# Patient Record
Sex: Female | Born: 1935 | Race: Black or African American | Hispanic: No | Smoking: Never smoker
Health system: Southern US, Community
[De-identification: ages and names within clinical notes are randomized; demographics above are authoritative.]

## PROBLEM LIST (undated history)

## (undated) DIAGNOSIS — M109 Gout, unspecified: Secondary | ICD-10-CM

## (undated) DIAGNOSIS — E039 Hypothyroidism, unspecified: Secondary | ICD-10-CM

## (undated) DIAGNOSIS — I10 Essential (primary) hypertension: Secondary | ICD-10-CM

## (undated) DIAGNOSIS — K5900 Constipation, unspecified: Secondary | ICD-10-CM

## (undated) DIAGNOSIS — J189 Pneumonia, unspecified organism: Secondary | ICD-10-CM

## (undated) DIAGNOSIS — I639 Cerebral infarction, unspecified: Secondary | ICD-10-CM

## (undated) DIAGNOSIS — I4891 Unspecified atrial fibrillation: Secondary | ICD-10-CM

## (undated) DIAGNOSIS — K219 Gastro-esophageal reflux disease without esophagitis: Secondary | ICD-10-CM

## (undated) DIAGNOSIS — R12 Heartburn: Secondary | ICD-10-CM

## (undated) DIAGNOSIS — E785 Hyperlipidemia, unspecified: Secondary | ICD-10-CM

## (undated) HISTORY — DX: Essential (primary) hypertension: I10

## (undated) HISTORY — DX: Hyperlipidemia, unspecified: E78.5

## (undated) HISTORY — PX: EYE SURGERY: SHX253

## (undated) HISTORY — PX: TUBAL LIGATION: SHX77

## (undated) HISTORY — PX: ABDOMINAL HYSTERECTOMY: SHX81

## (undated) HISTORY — PX: DILATION AND CURETTAGE OF UTERUS: SHX78

## (undated) HISTORY — DX: Gout, unspecified: M10.9

## (undated) HISTORY — DX: Cerebral infarction, unspecified: I63.9

## (undated) HISTORY — DX: Heartburn: R12

## (undated) HISTORY — DX: Unspecified atrial fibrillation: I48.91

## (undated) HISTORY — DX: Hypothyroidism, unspecified: E03.9

## (undated) HISTORY — DX: Constipation, unspecified: K59.00

## (undated) HISTORY — PX: OTHER SURGICAL HISTORY: SHX169

---

## 2000-07-16 ENCOUNTER — Other Ambulatory Visit: Admission: RE | Admit: 2000-07-16 | Discharge: 2000-07-16 | Payer: Self-pay | Admitting: Obstetrics & Gynecology

## 2002-12-05 ENCOUNTER — Other Ambulatory Visit: Admission: RE | Admit: 2002-12-05 | Discharge: 2002-12-05 | Payer: Self-pay | Admitting: Obstetrics & Gynecology

## 2004-02-19 ENCOUNTER — Ambulatory Visit: Payer: Self-pay | Admitting: Internal Medicine

## 2004-03-11 ENCOUNTER — Ambulatory Visit: Payer: Self-pay | Admitting: Internal Medicine

## 2004-08-01 ENCOUNTER — Ambulatory Visit: Payer: Self-pay | Admitting: Internal Medicine

## 2005-01-17 ENCOUNTER — Emergency Department (HOSPITAL_COMMUNITY): Admission: EM | Admit: 2005-01-17 | Discharge: 2005-01-17 | Payer: Self-pay | Admitting: Emergency Medicine

## 2005-02-03 ENCOUNTER — Ambulatory Visit: Payer: Self-pay | Admitting: Internal Medicine

## 2005-05-05 ENCOUNTER — Ambulatory Visit: Payer: Self-pay | Admitting: Internal Medicine

## 2005-07-31 ENCOUNTER — Ambulatory Visit: Payer: Self-pay | Admitting: Internal Medicine

## 2005-10-23 ENCOUNTER — Ambulatory Visit: Payer: Self-pay | Admitting: Internal Medicine

## 2005-10-30 ENCOUNTER — Ambulatory Visit: Payer: Self-pay | Admitting: Internal Medicine

## 2006-01-04 ENCOUNTER — Ambulatory Visit: Payer: Self-pay | Admitting: Internal Medicine

## 2006-05-17 ENCOUNTER — Ambulatory Visit: Payer: Self-pay | Admitting: Internal Medicine

## 2006-05-17 LAB — CONVERTED CEMR LAB
BUN: 13 mg/dL (ref 6–23)
Basophils Absolute: 0.1 10*3/uL (ref 0.0–0.1)
Basophils Relative: 1 % (ref 0.0–1.0)
Cholesterol: 206 mg/dL (ref 0–200)
Creatinine, Ser: 0.7 mg/dL (ref 0.4–1.2)
Direct LDL: 137.8 mg/dL
Eosinophils Absolute: 0.1 10*3/uL (ref 0.0–0.6)
Eosinophils Relative: 1.4 % (ref 0.0–5.0)
HCT: 39.2 % (ref 36.0–46.0)
HDL: 49.9 mg/dL (ref >39.0)
Hemoglobin: 13.2 g/dL (ref 12.0–15.0)
Lymphocytes Relative: 32.5 % (ref 12.0–46.0)
MCHC: 33.7 g/dL (ref 30.0–36.0)
MCV: 86 fL (ref 78.0–100.0)
Monocytes Absolute: 0.4 10*3/uL (ref 0.2–0.7)
Monocytes Relative: 5.8 % (ref 3.0–11.0)
Neutro Abs: 4.3 10*3/uL (ref 1.4–7.7)
Neutrophils Relative %: 59.3 % (ref 43.0–77.0)
Platelets: 257 10*3/uL (ref 150–400)
Potassium: 4.4 meq/L (ref 3.5–5.1)
RBC: 4.56 M/uL (ref 3.87–5.11)
RDW: 15 % — ABNORMAL HIGH (ref 11.5–14.6)
Total CHOL/HDL Ratio: 4.1
Triglycerides: 95 mg/dL (ref 0–149)
Uric Acid, Serum: 4.6 mg/dL (ref 2.4–7.0)
VLDL: 19 mg/dL (ref 0–40)
WBC: 7.3 10*3/uL (ref 4.5–10.5)

## 2006-06-02 ENCOUNTER — Ambulatory Visit: Payer: Self-pay | Admitting: Gastroenterology

## 2006-06-08 ENCOUNTER — Ambulatory Visit: Payer: Self-pay | Admitting: Gastroenterology

## 2006-06-08 LAB — HM COLONOSCOPY

## 2006-10-13 DIAGNOSIS — I10 Essential (primary) hypertension: Secondary | ICD-10-CM | POA: Insufficient documentation

## 2006-10-13 DIAGNOSIS — M1A00X0 Idiopathic chronic gout, unspecified site, without tophus (tophi): Secondary | ICD-10-CM

## 2006-11-23 ENCOUNTER — Ambulatory Visit: Payer: Self-pay | Admitting: Internal Medicine

## 2006-11-23 DIAGNOSIS — E119 Type 2 diabetes mellitus without complications: Secondary | ICD-10-CM

## 2006-11-23 LAB — CONVERTED CEMR LAB
BUN: 10 mg/dL (ref 6–23)
CO2: 32 meq/L (ref 19–32)
Calcium: 10.1 mg/dL (ref 8.4–10.5)
Chloride: 105 meq/L (ref 96–112)
Creatinine, Ser: 0.7 mg/dL (ref 0.4–1.2)
GFR calc Af Amer: 106 mL/min
GFR calc non Af Amer: 87 mL/min
Glucose, Bld: 105 mg/dL — ABNORMAL HIGH (ref 70–99)
Hgb A1c MFr Bld: 6.3 % — ABNORMAL HIGH (ref 4.6–6.0)
Potassium: 4.7 meq/L (ref 3.5–5.1)
Sodium: 145 meq/L (ref 135–145)
Uric Acid, Serum: 4.4 mg/dL (ref 2.4–7.0)

## 2007-05-24 ENCOUNTER — Ambulatory Visit: Payer: Self-pay | Admitting: Internal Medicine

## 2007-05-24 LAB — CONVERTED CEMR LAB
BUN: 18 mg/dL (ref 6–23)
CO2: 29 meq/L (ref 19–32)
Calcium: 10 mg/dL (ref 8.4–10.5)
Chloride: 100 meq/L (ref 96–112)
Creatinine, Ser: 0.8 mg/dL (ref 0.4–1.2)
GFR calc Af Amer: 90 mL/min
GFR calc non Af Amer: 75 mL/min
Glucose, Bld: 92 mg/dL (ref 70–99)
Hgb A1c MFr Bld: 6.5 % — ABNORMAL HIGH (ref 4.6–6.0)
Potassium: 4 meq/L (ref 3.5–5.1)
Sodium: 139 meq/L (ref 135–145)

## 2007-09-22 ENCOUNTER — Ambulatory Visit: Payer: Self-pay | Admitting: Internal Medicine

## 2007-12-16 ENCOUNTER — Ambulatory Visit: Payer: Self-pay | Admitting: Internal Medicine

## 2007-12-16 LAB — CONVERTED CEMR LAB
ALT: 18 units/L (ref 0–35)
AST: 26 units/L (ref 0–37)
Albumin: 3.9 g/dL (ref 3.5–5.2)
Alkaline Phosphatase: 83 units/L (ref 39–117)
BUN: 15 mg/dL (ref 6–23)
Bilirubin, Direct: 0.2 mg/dL (ref 0.0–0.3)
CO2: 29 meq/L (ref 19–32)
Calcium: 9.5 mg/dL (ref 8.4–10.5)
Chloride: 110 meq/L (ref 96–112)
Cholesterol: 206 mg/dL (ref 0–200)
Creatinine, Ser: 0.7 mg/dL (ref 0.4–1.2)
Creatinine,U: 178.6 mg/dL
Direct LDL: 149.3 mg/dL
GFR calc Af Amer: 105 mL/min
GFR calc non Af Amer: 87 mL/min
Glucose, Bld: 113 mg/dL — ABNORMAL HIGH (ref 70–99)
HDL: 40.6 mg/dL (ref >39.0)
Hgb A1c MFr Bld: 6.6 % — ABNORMAL HIGH (ref 4.6–6.0)
Microalb Creat Ratio: 45.4 mg/g — ABNORMAL HIGH (ref 0.0–30.0)
Microalb, Ur: 8.1 mg/dL — ABNORMAL HIGH (ref 0.0–1.9)
Potassium: 4 meq/L (ref 3.5–5.1)
Sodium: 143 meq/L (ref 135–145)
Total Bilirubin: 0.7 mg/dL (ref 0.3–1.2)
Total CHOL/HDL Ratio: 5.1
Total Protein: 7.3 g/dL (ref 6.0–8.3)
Triglycerides: 126 mg/dL (ref 0–149)
VLDL: 25 mg/dL (ref 0–40)

## 2007-12-23 ENCOUNTER — Ambulatory Visit: Payer: Self-pay | Admitting: Internal Medicine

## 2008-01-24 ENCOUNTER — Telehealth: Payer: Self-pay | Admitting: Internal Medicine

## 2008-02-13 ENCOUNTER — Encounter: Payer: Self-pay | Admitting: Internal Medicine

## 2008-02-15 ENCOUNTER — Telehealth: Payer: Self-pay | Admitting: Internal Medicine

## 2008-04-16 ENCOUNTER — Ambulatory Visit: Payer: Self-pay | Admitting: Internal Medicine

## 2008-04-16 DIAGNOSIS — K130 Diseases of lips: Secondary | ICD-10-CM

## 2008-04-16 LAB — CONVERTED CEMR LAB
BUN: 16 mg/dL (ref 6–23)
CO2: 34 meq/L — ABNORMAL HIGH (ref 19–32)
Calcium: 9.8 mg/dL (ref 8.4–10.5)
Chloride: 102 meq/L (ref 96–112)
Creatinine, Ser: 0.7 mg/dL (ref 0.4–1.2)
Folate: >20 ng/mL
GFR calc Af Amer: 105 mL/min
GFR calc non Af Amer: 87 mL/min
Glucose, Bld: 90 mg/dL (ref 70–99)
Hgb A1c MFr Bld: 6.8 % — ABNORMAL HIGH (ref 4.6–6.0)
Potassium: 3.7 meq/L (ref 3.5–5.1)
Sodium: 143 meq/L (ref 135–145)
Uric Acid, Serum: 4.9 mg/dL (ref 2.4–7.0)
Vitamin B-12: 1121 pg/mL — ABNORMAL HIGH (ref 211–911)

## 2008-09-10 ENCOUNTER — Ambulatory Visit: Payer: Self-pay | Admitting: Internal Medicine

## 2008-09-10 ENCOUNTER — Telehealth: Payer: Self-pay | Admitting: *Deleted

## 2008-09-10 DIAGNOSIS — J0100 Acute maxillary sinusitis, unspecified: Secondary | ICD-10-CM

## 2008-09-17 ENCOUNTER — Encounter: Payer: Self-pay | Admitting: *Deleted

## 2008-12-17 ENCOUNTER — Ambulatory Visit: Payer: Self-pay | Admitting: Internal Medicine

## 2008-12-17 LAB — CONVERTED CEMR LAB
ALT: 18 units/L (ref 0–35)
AST: 21 units/L (ref 0–37)
Albumin: 4.1 g/dL (ref 3.5–5.2)
Alkaline Phosphatase: 86 units/L (ref 39–117)
BUN: 11 mg/dL (ref 6–23)
Bilirubin, Direct: 0 mg/dL (ref 0.0–0.3)
CO2: 32 meq/L (ref 19–32)
Calcium: 9.6 mg/dL (ref 8.4–10.5)
Chloride: 107 meq/L (ref 96–112)
Creatinine, Ser: 0.9 mg/dL (ref 0.4–1.2)
GFR calc non Af Amer: 78.52 mL/min (ref >60)
Glucose, Bld: 83 mg/dL (ref 70–99)
Hgb A1c MFr Bld: 6.6 % — ABNORMAL HIGH (ref 4.6–6.5)
Potassium: 3.8 meq/L (ref 3.5–5.1)
Sodium: 143 meq/L (ref 135–145)
Total Bilirubin: 0.1 mg/dL — ABNORMAL LOW (ref 0.3–1.2)
Total Protein: 7.5 g/dL (ref 6.0–8.3)

## 2009-03-25 ENCOUNTER — Telehealth: Payer: Self-pay | Admitting: Internal Medicine

## 2009-06-05 ENCOUNTER — Ambulatory Visit: Payer: Self-pay | Admitting: Internal Medicine

## 2009-06-07 ENCOUNTER — Ambulatory Visit: Payer: Self-pay | Admitting: Internal Medicine

## 2009-06-07 LAB — CONVERTED CEMR LAB
BUN: 12 mg/dL (ref 6–23)
CO2: 33 meq/L — ABNORMAL HIGH (ref 19–32)
Calcium: 9.6 mg/dL (ref 8.4–10.5)
Chloride: 104 meq/L (ref 96–112)
Creatinine, Ser: 0.7 mg/dL (ref 0.4–1.2)
GFR calc non Af Amer: 104.81 mL/min (ref >60)
Glucose, Bld: 91 mg/dL (ref 70–99)
Hgb A1c MFr Bld: 6.2 % (ref 4.6–6.5)
Potassium: 3.7 meq/L (ref 3.5–5.1)
Sodium: 142 meq/L (ref 135–145)
Uric Acid, Serum: 4.1 mg/dL (ref 2.4–7.0)

## 2009-06-14 ENCOUNTER — Telehealth: Payer: Self-pay | Admitting: Internal Medicine

## 2009-06-14 DIAGNOSIS — F4321 Adjustment disorder with depressed mood: Secondary | ICD-10-CM

## 2009-09-16 ENCOUNTER — Encounter: Payer: Self-pay | Admitting: Internal Medicine

## 2009-10-22 LAB — HM DIABETES EYE EXAM

## 2009-11-15 ENCOUNTER — Ambulatory Visit: Payer: Self-pay | Admitting: Internal Medicine

## 2009-12-11 ENCOUNTER — Ambulatory Visit: Payer: Self-pay | Admitting: Internal Medicine

## 2009-12-11 LAB — CONVERTED CEMR LAB
ALT: 12 units/L (ref 0–35)
AST: 20 units/L (ref 0–37)
Albumin: 4.2 g/dL (ref 3.5–5.2)
Alkaline Phosphatase: 78 units/L (ref 39–117)
BUN: 16 mg/dL (ref 6–23)
Basophils Absolute: 0 10*3/uL (ref 0.0–0.1)
Basophils Relative: 0.4 % (ref 0.0–3.0)
Bilirubin, Direct: 0.1 mg/dL (ref 0.0–0.3)
CO2: 32 meq/L (ref 19–32)
Calcium: 9.7 mg/dL (ref 8.4–10.5)
Chloride: 104 meq/L (ref 96–112)
Cholesterol: 212 mg/dL — ABNORMAL HIGH (ref 0–200)
Creatinine, Ser: 0.8 mg/dL (ref 0.4–1.2)
Direct LDL: 136.9 mg/dL
Eosinophils Absolute: 0.1 10*3/uL (ref 0.0–0.7)
Eosinophils Relative: 1.5 % (ref 0.0–5.0)
GFR calc non Af Amer: 96.66 mL/min (ref >60)
Glucose, Bld: 108 mg/dL — ABNORMAL HIGH (ref 70–99)
HCT: 37.2 % (ref 36.0–46.0)
HDL: 53.6 mg/dL (ref >39.00)
Hemoglobin: 12.3 g/dL (ref 12.0–15.0)
Hgb A1c MFr Bld: 6.6 % — ABNORMAL HIGH (ref 4.6–6.5)
Lymphocytes Relative: 30.2 % (ref 12.0–46.0)
Lymphs Abs: 2.2 10*3/uL (ref 0.7–4.0)
MCHC: 33.1 g/dL (ref 30.0–36.0)
MCV: 87.7 fL (ref 78.0–100.0)
Monocytes Absolute: 0.5 10*3/uL (ref 0.1–1.0)
Monocytes Relative: 6.5 % (ref 3.0–12.0)
Neutro Abs: 4.4 10*3/uL (ref 1.4–7.7)
Neutrophils Relative %: 61.4 % (ref 43.0–77.0)
Platelets: 208 10*3/uL (ref 150.0–400.0)
Potassium: 4.3 meq/L (ref 3.5–5.1)
RBC: 4.24 M/uL (ref 3.87–5.11)
RDW: 16.3 % — ABNORMAL HIGH (ref 11.5–14.6)
Sodium: 144 meq/L (ref 135–145)
Total Bilirubin: 0.3 mg/dL (ref 0.3–1.2)
Total CHOL/HDL Ratio: 4
Total Protein: 7 g/dL (ref 6.0–8.3)
Triglycerides: 104 mg/dL (ref 0.0–149.0)
VLDL: 20.8 mg/dL (ref 0.0–40.0)
WBC: 7.2 10*3/uL (ref 4.5–10.5)

## 2009-12-18 ENCOUNTER — Ambulatory Visit: Payer: Self-pay | Admitting: Internal Medicine

## 2010-01-07 ENCOUNTER — Encounter: Payer: Self-pay | Admitting: Internal Medicine

## 2010-01-21 ENCOUNTER — Telehealth: Payer: Self-pay | Admitting: Internal Medicine

## 2010-04-22 NOTE — Assessment & Plan Note (Signed)
Summary: 1 month rov/njr   Vital Signs:  Patient profile:   77 year old female Height:      66 inches Weight:      166 pounds BMI:     26.89 Temp:     98.2 degrees F oral Pulse rate:   72 / minute Resp:     14 per minute BP sitting:   126 / 80  (left arm)  Vitals Entered By: Willy Eddy, LPN (December 18, 2009 12:22 PM) CC: roa labs, Type 2 diabetes mellitus follow-up Is Patient Diabetic? Yes Did you bring your meter with you today? No   Primary Care Durwin Davisson:  Stacie Glaze MD  CC:  roa labs and Type 2 diabetes mellitus follow-up.  History of Present Illness: follow up of DM and HTN recently lost husband  has been compliant with diet and medications blood glucoses are in normal ranges  Type 2 Diabetes Mellitus Follow-Up      This is a 77 year old woman who presents for Type 2 diabetes mellitus follow-up.  The patient denies polyuria, polydipsia, blurred vision, self managed hypoglycemia, hypoglycemia requiring help, weight loss, weight gain, and numbness of extremities.  The patient denies the following symptoms: neuropathic pain, chest pain, vomiting, orthostatic symptoms, poor wound healing, intermittent claudication, vision loss, and foot ulcer.  Since the last visit the patient reports good dietary compliance.  The patient has been measuring capillary blood glucose before breakfast.    Preventive Screening-Counseling & Management  Alcohol-Tobacco     Smoking Status: never     Tobacco Counseling: not indicated; no tobacco use  Problems Prior to Update: 1)  Grief Reaction, Acute  (ICD-309.0) 2)  Acute Maxillary Sinusitis  (ICD-461.0) 3)  Cheilosis  (ICD-528.5) 4)  Diabetes Mellitus, Type II  (ICD-250.00) 5)  Hypertension  (ICD-401.9) 6)  Gout  (ICD-274.9)  Current Problems (verified): 1)  Grief Reaction, Acute  (ICD-309.0) 2)  Acute Maxillary Sinusitis  (ICD-461.0) 3)  Cheilosis  (ICD-528.5) 4)  Diabetes Mellitus, Type II  (ICD-250.00) 5)  Hypertension   (ICD-401.9) 6)  Gout  (ICD-274.9)  Medications Prior to Update: 1)  Allopurinol 300 Mg  Tabs (Allopurinol) .... At Bedtime 2)  Lasix 20 Mg  Tabs (Furosemide) .... Once Daily 3)  Niacin Flush Free 500 Mg  Caps (Inositol Niacinate) .Marland Kitchen.. 1 Gm Every Day 4)  Tarka 4-240 Mg  Tbcr (Trandolapril-Verapamil Hcl) .... Two Times A Day 5)  Metformin Hcl 500 Mg  Tabs (Metformin Hcl) .... Once Daily 6)  Prednisone 5 Mg  Tabs (Prednisone) .... As Needed 7)  Elocon 0.1 %  Crea (Mometasone Furoate) .... As Needed 8)  Altabax 1 % Oint (Retapamulin) .... Apply To Edge of Lip Daily  Current Medications (verified): 1)  Allopurinol 300 Mg  Tabs (Allopurinol) .... At Bedtime 2)  Lasix 20 Mg  Tabs (Furosemide) .... Once Daily 3)  Tarka 4-240 Mg  Tbcr (Trandolapril-Verapamil Hcl) .... Two Times A Day 4)  Metformin Hcl 500 Mg  Tabs (Metformin Hcl) .... Once Daily 5)  Prednisone 5 Mg  Tabs (Prednisone) .... As Needed 6)  Elocon 0.1 %  Crea (Mometasone Furoate) .... As Needed  Allergies (verified): 1)  ! * Ivp Dye  Past History:  Family History: Last updated: 11/23/2006 father  unknown Family History Hypertension  Social History: Last updated: 11/23/2006 Never Smoked Married  Risk Factors: Exercise: yes (11/23/2006)  Risk Factors: Smoking Status: never (12/18/2009)  Past medical, surgical, family and social histories (including risk  factors) reviewed, and no changes noted (except as noted below).  Past Medical History: Reviewed history from 11/23/2006 and no changes required. Gout Hypertension AODM Lipids Diabetes mellitus, type II  Past Surgical History: Reviewed history from 10/13/2006 and no changes required. Hysterectomy D&C Tubal ligation Colonoscopy-06/08/2006  Family History: Reviewed history from 11/23/2006 and no changes required. father  unknown Family History Hypertension  Social History: Reviewed history from 11/23/2006 and no changes required. Never  Smoked Married  Review of Systems  The patient denies anorexia, fever, weight loss, weight gain, vision loss, decreased hearing, hoarseness, chest pain, syncope, dyspnea on exertion, peripheral edema, prolonged cough, headaches, hemoptysis, abdominal pain, melena, hematochezia, severe indigestion/heartburn, hematuria, incontinence, genital sores, muscle weakness, suspicious skin lesions, transient blindness, difficulty walking, depression, unusual weight change, abnormal bleeding, enlarged lymph nodes, angioedema, and breast masses.         Flu Vaccine Consent Questions     Do you have a history of severe allergic reactions to this vaccine? no    Any prior history of allergic reactions to egg and/or gelatin? no    Do you have a sensitivity to the preservative Thimersol? no    Do you have a past history of Guillan-Barre Syndrome? no    Do you currently have an acute febrile illness? no    Have you ever had a severe reaction to latex? no    Vaccine information given and explained to patient? yes    Are you currently pregnant? no    Lot Number:[id number]   Exp Date:09/20/2010   Site Given  Left Deltoid IM   Physical Exam  General:  Well-developed,well-nourished,in no acute distress; alert,appropriate and cooperative throughout examination Head:  no abnormalities observed.   Eyes:  pupils equal and pupils round.  loss of vision in left eye Ears:  R ear normal and L ear normal.   Nose:  nasal dischargemucosal pallor, mucosal erythema, and mucosal edema.   Mouth:  posterior lymphoid hypertrophy and postnasal drip.   Neck:  supple.   Lungs:  normal respiratory effort, no dullness, and no wheezes.   Heart:  normal rate and regular rhythm.     Impression & Recommendations:  Problem # 1:  HYPERTENSION (ICD-401.9)  Her updated medication list for this problem includes:    Lasix 20 Mg Tabs (Furosemide) ..... Once daily    Tarka 4-240 Mg Tbcr (Trandolapril-verapamil hcl) .Marland Kitchen..Marland Kitchen Two times a  day  BP today: 126/80 Prior BP: 140/80 (11/15/2009)  Prior 10 Yr Risk Heart Disease: Not enough information (12/17/2008)  Labs Reviewed: K+: 4.3 (12/11/2009) Creat: : 0.8 (12/11/2009)   Chol: 212 (12/11/2009)   HDL: 53.60 (12/11/2009)   LDL: DEL (12/16/2007)   TG: 104.0 (12/11/2009)  Problem # 2:  DIABETES MELLITUS, TYPE II (ICD-250.00)  Her updated medication list for this problem includes:    Tarka 4-240 Mg Tbcr (Trandolapril-verapamil hcl) .Marland Kitchen..Marland Kitchen Two times a day    Metformin Hcl 500 Mg Tabs (Metformin hcl) ..... Once daily  Labs Reviewed: Creat: 0.8 (12/11/2009)     Last Eye Exam: normal (10/22/2009) Reviewed HgBA1c results: 6.6 (12/11/2009)  6.2 (06/07/2009)  Problem # 3:  GRIEF REACTION, ACUTE (ICD-309.0) loss of husband improved moon dealing with loss  Complete Medication List: 1)  Allopurinol 300 Mg Tabs (Allopurinol) .... At bedtime 2)  Lasix 20 Mg Tabs (Furosemide) .... Once daily 3)  Tarka 4-240 Mg Tbcr (Trandolapril-verapamil hcl) .... Two times a day 4)  Metformin Hcl 500 Mg Tabs (Metformin hcl) .... Once daily 5)  Prednisone 5 Mg Tabs (Prednisone) .... As needed 6)  Elocon 0.1 % Crea (Mometasone furoate) .... As needed  Other Orders: Flu Vaccine 32yrs + MEDICARE PATIENTS (O3500) Administration Flu vaccine - MCR (X3818)  Patient Instructions: 1)  Please schedule a follow-up appointment in 6 months. 2)  HbgA1C prior to visit, ICD-9:

## 2010-04-22 NOTE — Assessment & Plan Note (Signed)
Summary: HTN CONCERNS // RS   Vital Signs:  Patient profile:   77 year old female Height:      66 inches Weight:      166 pounds BMI:     26.89 Temp:     98.2 degrees F oral Pulse rate:   76 / minute Resp:     14 per minute BP sitting:   140 / 80  (left arm)  Vitals Entered By: Willy Eddy, LPN (November 15, 2009 11:21 AM) CC: roa- c/o elevated bp- under alot of stress taking care of 80 year old mom and loss of husband Is Patient Diabetic? Yes Did you bring your meter with you today? No   Primary Care Jasmina Gendron:  Stacie Glaze MD  CC:  roa- c/o elevated bp- under alot of stress taking care of 59 year old mom and loss of husband.  History of Present Illness: The pt was asked about all immunizations, health maint. services that are appropriate to their age and was given guidance on diet exercize  and weight management   Follow-Up Visit      This is a 77 year old woman who presents for Follow-up visit.  The patient denies chest pain, palpitations, dizziness, syncope, low blood sugar symptoms, high blood sugar symptoms, edema, SOB, DOE, PND, and orthopnea.  Since the last visit the patient notes no new problems or concerns.  The patient reports taking meds as prescribed.  When questioned about possible medication side effects, the patient notes none.    Preventive Screening-Counseling & Management  Alcohol-Tobacco     Smoking Status: never  Problems Prior to Update: 1)  Grief Reaction, Acute  (ICD-309.0) 2)  Acute Maxillary Sinusitis  (ICD-461.0) 3)  Cheilosis  (ICD-528.5) 4)  Diabetes Mellitus, Type II  (ICD-250.00) 5)  Hypertension  (ICD-401.9) 6)  Gout  (ICD-274.9)  Current Problems (verified): 1)  Grief Reaction, Acute  (ICD-309.0) 2)  Acute Maxillary Sinusitis  (ICD-461.0) 3)  Cheilosis  (ICD-528.5) 4)  Diabetes Mellitus, Type II  (ICD-250.00) 5)  Hypertension  (ICD-401.9) 6)  Gout  (ICD-274.9)  Medications Prior to Update: 1)  Allopurinol 300 Mg  Tabs  (Allopurinol) .... At Bedtime 2)  Lasix 20 Mg  Tabs (Furosemide) .... Once Daily 3)  Niacin Flush Free 500 Mg  Caps (Inositol Niacinate) .Marland Kitchen.. 1 Gm Every Day 4)  Tarka 4-240 Mg  Tbcr (Trandolapril-Verapamil Hcl) .... Two Times A Day 5)  Metformin Hcl 500 Mg  Tabs (Metformin Hcl) .... Once Daily 6)  Prednisone 5 Mg  Tabs (Prednisone) .... As Needed 7)  Elocon 0.1 %  Crea (Mometasone Furoate) .... As Needed 8)  Altabax 1 % Oint (Retapamulin) .... Apply To Edge of Lip Daily  Current Medications (verified): 1)  Allopurinol 300 Mg  Tabs (Allopurinol) .... At Bedtime 2)  Lasix 20 Mg  Tabs (Furosemide) .... Once Daily 3)  Niacin Flush Free 500 Mg  Caps (Inositol Niacinate) .Marland Kitchen.. 1 Gm Every Day 4)  Tarka 4-240 Mg  Tbcr (Trandolapril-Verapamil Hcl) .... Two Times A Day 5)  Metformin Hcl 500 Mg  Tabs (Metformin Hcl) .... Once Daily 6)  Prednisone 5 Mg  Tabs (Prednisone) .... As Needed 7)  Elocon 0.1 %  Crea (Mometasone Furoate) .... As Needed 8)  Altabax 1 % Oint (Retapamulin) .... Apply To Edge of Lip Daily  Allergies (verified): 1)  ! * Ivp Dye  Past History:  Family History: Last updated: 11/23/2006 father  unknown Family History Hypertension  Social  History: Last updated: 11/23/2006 Never Smoked Married  Risk Factors: Exercise: yes (11/23/2006)  Risk Factors: Smoking Status: never (11/15/2009)  Past medical, surgical, family and social histories (including risk factors) reviewed, and no changes noted (except as noted below).  Past Medical History: Reviewed history from 11/23/2006 and no changes required. Gout Hypertension AODM Lipids Diabetes mellitus, type II  Past Surgical History: Reviewed history from 10/13/2006 and no changes required. Hysterectomy D&C Tubal ligation Colonoscopy-06/08/2006  Family History: Reviewed history from 11/23/2006 and no changes required. father  unknown Family History Hypertension  Social History: Reviewed history from 11/23/2006  and no changes required. Never Smoked Married  Review of Systems  The patient denies anorexia, fever, weight loss, weight gain, vision loss, decreased hearing, hoarseness, chest pain, syncope, dyspnea on exertion, peripheral edema, prolonged cough, headaches, hemoptysis, abdominal pain, melena, hematochezia, severe indigestion/heartburn, hematuria, incontinence, genital sores, muscle weakness, suspicious skin lesions, transient blindness, difficulty walking, depression, unusual weight change, abnormal bleeding, enlarged lymph nodes, angioedema, and breast masses.    Physical Exam  General:  Well-developed,well-nourished,in no acute distress; alert,appropriate and cooperative throughout examination Head:  no abnormalities observed.   Eyes:  pupils equal and pupils round.  loss of vision in left eye Ears:  R ear normal and L ear normal.   Nose:  nasal dischargemucosal pallor, mucosal erythema, and mucosal edema.   Mouth:  posterior lymphoid hypertrophy and postnasal drip.   Neck:  supple.   Lungs:  normal respiratory effort, no dullness, and no wheezes.   Heart:  normal rate and regular rhythm.   Abdomen:  soft and non-tender.   Msk:  no joint swelling and no joint warmth.   Pulses:  R and L carotid,radial,femoral,dorsalis pedis and posterior tibial pulses are full and equal bilaterally Extremities:  No clubbing, cyanosis, edema, or deformity noted with normal full range of motion of all joints.   Neurologic:  cranial nerves II-XII intact and finger-to-nose normal.    Diabetes Management Exam:    Eye Exam:       Eye Exam done elsewhere          Date: 10/22/2009          Results: normal          Done by: hecker   Impression & Recommendations:  Problem # 1:  GRIEF REACTION, ACUTE (ICD-309.0)  Problem # 2:  DIABETES MELLITUS, TYPE II (ICD-250.00) the DM stable Her updated medication list for this problem includes:    Tarka 4-240 Mg Tbcr (Trandolapril-verapamil hcl) .Marland Kitchen..Marland Kitchen Two times  a day    Metformin Hcl 500 Mg Tabs (Metformin hcl) ..... Once daily  Labs Reviewed: Creat: 0.7 (06/07/2009)     Last Eye Exam: normal (10/22/2009) Reviewed HgBA1c results: 6.2 (06/07/2009)  6.6 (12/17/2008)  Problem # 3:  HYPERTENSION (ICD-401.9) HTN stable with the lasix she had stopped the lasix and has now resumed it  Her updated medication list for this problem includes:    Lasix 20 Mg Tabs (Furosemide) ..... Once daily    Tarka 4-240 Mg Tbcr (Trandolapril-verapamil hcl) .Marland Kitchen..Marland Kitchen Two times a day  BP today: 140/80 Prior BP: 130/70 (06/07/2009)  Prior 10 Yr Risk Heart Disease: Not enough information (12/17/2008)  Labs Reviewed: K+: 3.7 (06/07/2009) Creat: : 0.7 (06/07/2009)   Chol: 206 (12/16/2007)   HDL: 40.6 (12/16/2007)   LDL: DEL (12/16/2007)   TG: 126 (12/16/2007)  Complete Medication List: 1)  Allopurinol 300 Mg Tabs (Allopurinol) .... At bedtime 2)  Lasix 20 Mg Tabs (Furosemide) .Marland KitchenMarland KitchenMarland Kitchen  Once daily 3)  Niacin Flush Free 500 Mg Caps (Inositol niacinate) .Marland Kitchen.. 1 gm every day 4)  Tarka 4-240 Mg Tbcr (Trandolapril-verapamil hcl) .... Two times a day 5)  Metformin Hcl 500 Mg Tabs (Metformin hcl) .... Once daily 6)  Prednisone 5 Mg Tabs (Prednisone) .... As needed 7)  Elocon 0.1 % Crea (Mometasone furoate) .... As needed 8)  Altabax 1 % Oint (Retapamulin) .... Apply to edge of lip daily  Patient Instructions: 1)  must stay on the furosemide!  follw up at the end of september 2)  Lipid Panel prior to visit, ICD-9:272.4 3)  BMP prior to visit, ICD-9:401.9 4)  CBC w/ Diff prior to visit, ICD-9:995.20 5)  Hepatic Panel prior to visit, ICD-9:995.20

## 2010-04-22 NOTE — Progress Notes (Signed)
Summary: conjunctivitis, Rx request  Phone Note Call from Patient   Caller: Patient Call For: Stacie Glaze MD Summary of Call: Pt states she has conjunctivitis in her left eye and needs RX called to [facility] ([place] [phone] Initial call taken by: Lynann Beaver CMA,  March 25, 2009 11:48 AM  Follow-up for Phone Call        tobradex 2 drops effected eye qid for 7-10 days Follow-up by: Stacie Glaze MD,  March 25, 2009 12:09 PM  Additional Follow-up for Phone Call Additional follow up Details #1::        Rx called in as pt daughter is here seeing Dr Caryl Never for same problem.  Daughter will pick up for mother at pharmacy Additional Follow-up by: Sid Falcon LPN,  March 25, 2009 12:28 PM

## 2010-04-22 NOTE — Progress Notes (Signed)
Summary: refill  Phone Note Refill Request Call back at [phone] Message from:  daughter-tracy   triage vm  Refills Requested: Medication #1:  TARKA 4-240 MG  TBCR two times a day [facility]  Initial call taken by: Warnell Forester,  January 21, 2010 10:11 AM    Prescriptions: Jolene Provost 4-240 MG  TBCR (TRANDOLAPRIL-VERAPAMIL HCL) two times a day  #180 x 3   Entered by:   Willy Eddy, LPN   Authorized by:   Stacie Glaze MD   Signed by:   Willy Eddy, LPN on 88/41/6606   Method used:   Electronically to        [facility] (mail-order)       [facility]       [address]       [place], [place]  [address]       Ph: [phone]       Fax: [phone]   RxID:   [id number]

## 2010-04-22 NOTE — Progress Notes (Signed)
Summary: REQ FOR RX (CONJUCTIVITIS?)  Phone Note Call from Patient   Caller: Patient  [phone] Reason for Call: Talk to Nurse, Talk to Doctor Summary of Call: Pt called in to adv that she is exp conjuctivitis (eye is red, woke up w/ crusting around eye, w/ itching and soreness).... Pt would like to have a Rx sent into [facility] [place].  Pt can be reached at [phone] with any questions or concerns.  Initial call taken by: Debbra Riding,  June 14, 2009 8:35 AM  Follow-up for Phone Call        per dr Lovell Sheehan- may have corticosporin opthalmic drops  4 drops four times a day in affected eye- med called and pt informed Follow-up by: Willy Eddy, LPN,  June 14, 2009 10:57 AM

## 2010-04-22 NOTE — Letter (Signed)
Summary: [facility]  [facility]   Imported By: Maryln Gottron 01/13/2010 13:22:02  _____________________________________________________________________  External Attachment:    Type:   Image     Comment:   External Document

## 2010-04-22 NOTE — Assessment & Plan Note (Signed)
Summary: roa/12noon/njr   Vital Signs:  Patient profile:   77 year old female Height:      66 inches Weight:      168 pounds BMI:     27.21 Temp:     98.2 degrees F oral Pulse rate:   76 / minute Resp:     14 per minute BP sitting:   130 / 70  (left arm)  Vitals Entered By: Willy Eddy, LPN (June 07, 2009 12:04 PM) CC: roa- med review, Hypertension Management   CC:  roa- med review and Hypertension Management.  History of Present Illness: dicusion of loss of her husband and its effect on her life and mood I have spent greater that 30 min face to face evaluating this patient of which over 1/2 was in counsilling ( grief)  Hypertension History:      She denies headache, chest pain, palpitations, dyspnea with exertion, orthopnea, PND, peripheral edema, visual symptoms, neurologic problems, syncope, and side effects from treatment.        Positive major cardiovascular risk factors include female age 81 years old or older, diabetes, and hypertension.  Negative major cardiovascular risk factors include non-tobacco-user status.     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 At Bedtime 2)  Lasix 20 Mg  Tabs (Furosemide) .... Once Daily 3)  Niacin Flush Free 500 Mg  Caps (Inositol Niacinate) .Marland Kitchen.. 1 Gm Every Day 4)  Tarka 4-240 Mg  Tbcr (Trandolapril-Verapamil Hcl) .... Two Times A Day 5)  Metformin Hcl 500 Mg  Tabs (Metformin Hcl) .... Once Daily 6)  Prednisone  5 Mg  Tabs (Prednisone) .... As Needed 7)  Elocon 0.1 %  Crea (Mometasone Furoate) .... As Needed 8)  Altabax 1 % Oint (Retapamulin) .... Apply To Edge of Lip Daily  Current Medications (verified): 1)  Allopurinol 300 Mg  Tabs (Allopurinol) .... At Bedtime 2)  Lasix 20 Mg  Tabs (Furosemide) .... Once Daily 3)  Niacin Flush Free 500 Mg  Caps (Inositol Niacinate) .Marland Kitchen.. 1 Gm Every Day 4)  Tarka 4-240 Mg  Tbcr (Trandolapril-Verapamil Hcl) .... Two Times A Day 5)  Metformin Hcl 500 Mg  Tabs (Metformin Hcl) .... Once Daily 6)  Prednisone 5 Mg  Tabs (Prednisone) .... As Needed 7)  Elocon 0.1 %  Crea (Mometasone Furoate) .... As Needed 8)  Altabax 1 % Oint (Retapamulin) .... Apply To Edge of Lip Daily  Allergies (verified): 1)  ! * Ivp Dye  Past History:  Family History: Last updated: 11/23/2006 father  unknown Family History Hypertension  Social History: Last updated: 11/23/2006 Never Smoked Married  Risk Factors: Exercise: yes (11/23/2006)  Risk Factors: Smoking Status: never (06/07/2009)  Past medical, surgical, family and social histories (including risk factors) reviewed, and no changes noted (except as noted below).  Past Medical History: Reviewed history from 11/23/2006 and no changes required. Gout Hypertension AODM Lipids Diabetes mellitus, type II  Past Surgical History: Reviewed history from 10/13/2006 and no  changes required. Hysterectomy D&C Tubal ligation Colonoscopy-06/08/2006  Family History: Reviewed history from 11/23/2006 and no changes required. father  unknown Family History Hypertension  Social History: Reviewed history from 11/23/2006 and no changes required. Never Smoked Married  Review of Systems       The patient complains of depression.  The patient denies anorexia, fever, weight loss, weight gain, vision loss, decreased hearing, hoarseness, chest pain, syncope, dyspnea on exertion, peripheral edema, prolonged cough, headaches,  hemoptysis, abdominal pain, hematochezia, severe indigestion/heartburn, hematuria, incontinence, genital sores, muscle weakness, suspicious skin lesions, transient blindness, difficulty walking, unusual weight change, abnormal bleeding, enlarged lymph nodes, angioedema, and breast masses.    Physical Exam  General:  Well-developed,well-nourished,in no acute distress; alert,appropriate and cooperative throughout examination Eyes:  pupils equal and pupils round.  loss of vision in left eye Nose:  nasal dischargemucosal pallor, mucosal erythema, and mucosal edema.   Neck:  supple.   Lungs:  normal respiratory effort, no dullness, and no wheezes.   Heart:  normal rate and regular rhythm.   Abdomen:  soft and non-tender.   Msk:  no joint swelling and no joint warmth.   Pulses:  R and L carotid,radial,femoral,dorsalis pedis and posterior tibial pulses are full and equal bilaterally Extremities:  No clubbing, cyanosis, edema, or deformity noted with normal full range of motion of all joints.   Neurologic:  cranial nerves II-XII intact and finger-to-nose normal.   Psych:  Oriented X3, subdued, and slightly anxious.     Impression & Recommendations:  Problem # 1:  HYPERTENSION (ICD-401.9)  Her updated medication list for this problem includes:    Lasix 20 Mg Tabs (Furosemide) ..... Once daily    Tarka 4-240 Mg Tbcr (Trandolapril-verapamil hcl) .Marland Kitchen..Marland Kitchen Two times a day  Orders: TLB-BMP (Basic Metabolic Panel-BMET) (80048-METABOL)  BP today: 130/70 Prior BP: 138/80 (06/05/2009)  Prior 10 Yr Risk Heart Disease: Not enough information (12/17/2008)  Labs Reviewed: K+: 3.8 (12/17/2008) Creat: : 0.9 (12/17/2008)   Chol: 206 (12/16/2007)   HDL: 40.6 (12/16/2007)   LDL: DEL (12/16/2007)   TG: 126 (12/16/2007)  Problem # 2:  DIABETES MELLITUS, TYPE II (ICD-250.00)  Her updated medication list for this problem includes:    Tarka 4-240 Mg Tbcr (Trandolapril-verapamil hcl) .Marland Kitchen..Marland Kitchen Two times a day     Metformin Hcl 500 Mg Tabs (Metformin hcl) ..... Once daily  Orders: Venipuncture (16109) TLB-BMP (Basic Metabolic Panel-BMET) (80048-METABOL) TLB-A1C / Hgb A1C (Glycohemoglobin) (83036-A1C)  Labs Reviewed: Creat: 0.9 (12/17/2008)     Last Eye Exam: No diabetic retinopathy.    (09/12/2008) Reviewed HgBA1c results: 6.6 (12/17/2008)  6.8 (04/16/2008)  Problem # 3:  GRIEF REACTION, ACUTE (ICD-309.0) councilling  Complete Medication List: 1)  Allopurinol 300 Mg Tabs (Allopurinol) .... At bedtime 2)  Lasix 20 Mg Tabs (Furosemide) .... Once daily 3)  Niacin Flush Free 500 Mg Caps (Inositol niacinate) .Marland Kitchen.. 1 gm every day 4)  Tarka 4-240 Mg Tbcr (Trandolapril-verapamil hcl) .... Two times a day 5)  Metformin Hcl 500 Mg Tabs (Metformin hcl) .... Once daily 6)  Prednisone 5 Mg Tabs (Prednisone) .... As needed 7)  Elocon 0.1 % Crea (Mometasone furoate) .... As needed 8)  Altabax 1 % Oint (Retapamulin) .... Apply to edge of lip daily  Other Orders: TLB-Uric Acid, Blood (84550-URIC)  Hypertension Assessment/Plan:      The patient's hypertensive risk group is category C: Target organ damage and/or diabetes.  Today's blood pressure is 130/70.  Her blood pressure goal is < 130/80.  Patient  Instructions: 1)  Please schedule a follow-up appointment in 4 months.

## 2010-04-22 NOTE — Letter (Signed)
Summary: Eye Exam/[facility]  Eye Exam/[facility]   Imported By: Maryln Gottron 09/24/2009 12:55:43  _____________________________________________________________________  External Attachment:    Type:   Image     Comment:   External Document

## 2010-04-22 NOTE — Assessment & Plan Note (Signed)
Summary: MED CHECK/REFILL/CJR   Vital Signs:  Patient profile:   77 year old female Height:      66 inches Weight:      166 pounds BMI:     26.89 Temp:     98.2 degrees F oral Pulse rate:   76 / minute Resp:     14 per minute BP sitting:   138 / 80  (left arm)  Vitals Entered By: Willy Eddy, LPN (June 05, 2009 10:19 AM) CC: pt arrived- no charge   CC:  pt arrived- no charge.  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 At Bedtime 2)  Lasix 20 Mg  Tabs (Furosemide) .... Once Daily 3)  Niacin Flush Free 500 Mg  Caps (Inositol Niacinate) .Marland Kitchen.. 1 Gm Every Day 4)  Tarka 4-240 Mg  Tbcr (Trandolapril-Verapamil Hcl) .... Two Times A Day 5)  Metformin Hcl 500 Mg  Tabs (Metformin Hcl) .... Once Daily 6)  Prednisone 5 Mg  Tabs (Prednisone) .... As Needed 7)  Elocon 0.1 %  Crea (Mometasone Furoate) .... As Needed 8)  Altabax 1 % Oint (Retapamulin) .... Apply To Edge of Lip Daily  Allergies (verified): 1)  ! * Ivp Dye   Complete Medication List: 1)  Allopurinol 300 Mg Tabs (Allopurinol) .... At bedtime 2)  Lasix 20 Mg Tabs (Furosemide) .... Once daily 3)  Niacin Flush Free 500 Mg Caps (Inositol niacinate) .Marland Kitchen.. 1 gm every day 4)  Tarka 4-240 Mg Tbcr (Trandolapril-verapamil hcl) .... Two times a day 5)  Metformin Hcl 500 Mg Tabs (Metformin hcl) .... Once daily 6)  Prednisone 5 Mg Tabs (Prednisone) .... As needed 7)  Elocon 0.1 % Crea (Mometasone furoate) .... As needed 8)  Altabax 1 % Oint (Retapamulin) .... Apply to edge of lip daily  Other Orders: No Charge Patient Arrived (NCPA0) (NCPA0) Prescriptions: ELOCON 0.1 %  CREA (MOMETASONE FUROATE) as  needed  #45 gm tube x 3   Entered by:   Willy Eddy, LPN   Authorized by:   Stacie Glaze MD   Signed by:   Willy Eddy, LPN on 16/12/9602   Method used:   Print then Give to Patient   RxID:   [id number] METFORMIN HCL 500 MG  TABS (METFORMIN HCL) once daily  #90 x 3   Entered by:   Willy Eddy, LPN   Authorized by:   Stacie Glaze MD   Signed by:   Willy Eddy, LPN on 21/30/8657   Method used:   Print then Give to Patient   RxID:   [id number] TARKA 4-240 MG  TBCR (TRANDOLAPRIL-VERAPAMIL HCL) two times a day  #180 x 3   Entered by:   Willy Eddy, LPN   Authorized by:   Stacie Glaze MD   Signed by:   Willy Eddy, LPN on 03/25/7251   Method used:   Print then Give to Patient   RxID:   [id number] LASIX 20 MG  TABS (FUROSEMIDE) once daily  #90 x 3   Entered by:   Willy Eddy, LPN   Authorized by:   Stacie Glaze MD   Signed by:  Willy Eddy, LPN on 16/12/9602   Method used:   Print then Give to Patient   RxID:   [id number] ALLOPURINOL 300 MG  TABS (ALLOPURINOL) at bedtime  #90 x 3   Entered by:   Willy Eddy, LPN   Authorized by:   Stacie Glaze MD   Signed by:   Willy Eddy, LPN on 21/30/8657   Method used:   Print then Give to Patient   RxID:   [id number]

## 2010-06-04 ENCOUNTER — Other Ambulatory Visit (INDEPENDENT_AMBULATORY_CARE_PROVIDER_SITE_OTHER): Payer: Medicare Other | Admitting: Internal Medicine

## 2010-06-04 DIAGNOSIS — E119 Type 2 diabetes mellitus without complications: Secondary | ICD-10-CM

## 2010-06-10 ENCOUNTER — Ambulatory Visit: Payer: Self-pay | Admitting: Internal Medicine

## 2010-06-17 ENCOUNTER — Encounter: Payer: Self-pay | Admitting: Internal Medicine

## 2010-06-18 ENCOUNTER — Ambulatory Visit (INDEPENDENT_AMBULATORY_CARE_PROVIDER_SITE_OTHER): Payer: Medicare Other | Admitting: Internal Medicine

## 2010-06-18 ENCOUNTER — Encounter: Payer: Self-pay | Admitting: Internal Medicine

## 2010-06-18 DIAGNOSIS — E119 Type 2 diabetes mellitus without complications: Secondary | ICD-10-CM

## 2010-06-18 DIAGNOSIS — M109 Gout, unspecified: Secondary | ICD-10-CM

## 2010-06-18 DIAGNOSIS — I10 Essential (primary) hypertension: Secondary | ICD-10-CM

## 2010-06-18 DIAGNOSIS — E785 Hyperlipidemia, unspecified: Secondary | ICD-10-CM

## 2010-06-18 MED ORDER — ALLOPURINOL 300 MG PO TABS: 300.0000 mg | ORAL_TABLET | Freq: Every day | ORAL | Status: DC

## 2010-06-18 MED ORDER — TRANDOLAPRIL-VERAPAMIL HCL ER 4-240 MG PO TBCR: 1.0000 | EXTENDED_RELEASE_TABLET | Freq: Two times a day (BID) | ORAL | Status: DC

## 2010-06-18 MED ORDER — METFORMIN HCL 500 MG PO TABS: 500.0000 mg | ORAL_TABLET | Freq: Every day | ORAL | Status: DC

## 2010-06-18 MED ORDER — FUROSEMIDE 20 MG PO TABS: 20.0000 mg | ORAL_TABLET | Freq: Every day | ORAL | Status: DC

## 2010-06-18 NOTE — Progress Notes (Signed)
Subjective:    Patient ID: Haley Wilcox, female    DOB: October 07, 1931, 77 y.o.   MRN: [id number]  HPI patient is a 77 year old African American female with a history of diabetes hypertension and gout who recently lost her husband has been suffering a grief reaction she also is under the stress of caring for her 22 year old mother in her home setting.  She denies any chest pain shortness of breath PND orthopnea she does have some experience of sadness to 2 so the life situation she refuses any presents at this time she does have a helper from her church which allows her to get a house some discussed carefully with consideration of placement of her mother at some point.    Review of Systems  Constitutional: Negative for activity change, appetite change and fatigue.  HENT: Negative for ear pain, congestion, neck pain, postnasal drip and sinus pressure.   Eyes: Negative for redness and visual disturbance.  Respiratory: Negative for cough, shortness of breath and wheezing.   Gastrointestinal: Negative for abdominal pain and abdominal distention.  Genitourinary: Negative for dysuria, frequency and menstrual problem.  Musculoskeletal: Negative for myalgias, joint swelling and arthralgias.  Skin: Negative for rash and wound.  Neurological: Negative for dizziness, weakness and headaches.  Hematological: Negative for adenopathy. Does not bruise/bleed easily.  Psychiatric/Behavioral: Negative for sleep disturbance and decreased concentration.       Past Medical History  Diagnosis Date  . Gout   . Hypertension   . Diabetes mellitus   . Hyperlipidemia    Past Surgical History  Procedure Date  . Abdominal hysterectomy   . D &c   . Dilation and curettage of uterus   . Tubal ligation     reports that she has never smoked. She does not have any smokeless tobacco history on file. She reports that she does not drink alcohol or use illicit drugs. family history includes Hypertension in her  mother. No Known Allergies  Objective:   Physical Exam  Constitutional: She is oriented to person, place, and time. She appears well-developed and well-nourished. No distress.  HENT:  Head: Normocephalic and atraumatic.  Right Ear: External ear normal.  Left Ear: External ear normal.  Nose: Nose normal.  Mouth/Throat: Oropharynx is clear and moist.  Eyes: Conjunctivae and EOM are normal. Pupils are equal, round, and reactive to light.  Neck: Normal range of motion. Neck supple. No JVD present. No tracheal deviation present. No thyromegaly present.  Cardiovascular: Normal rate, regular rhythm, normal heart sounds and intact distal pulses.   No murmur heard. Pulmonary/Chest: Effort normal and breath sounds normal. She has no wheezes. She exhibits no tenderness.  Abdominal: Soft. Bowel sounds are normal.  Musculoskeletal: Normal range of motion. She exhibits no edema and no tenderness.  Lymphadenopathy:    She has no cervical adenopathy.  Neurological: She is alert and oriented to person, place, and time. She has normal reflexes. No cranial nerve deficit.  Skin: Skin is warm and dry. She is not diaphoretic.  Psychiatric: She has a normal mood and affect. Her behavior is normal.          Assessment & Plan:   the patient diabetes is in better control with reduced A1c below 6-1/2 which is her goal she states she is exercising and eat better.  He has not had a gouty flare for over 6 months and she has been on the high internal tolerating it without any side effects her blood pressure is well controlled her  weight is stable she has no side effects from medications no signs of increasing risks for coronary disease.  We reviewed the need to eat properly exercise and obtained appropriate amounts of sleep continue with her medication as directed and refilled all of her medications

## 2010-08-08 NOTE — Assessment & Plan Note (Signed)
[facility]                         GASTROENTEROLOGY OFFICE NOTE   JAMYRAH, SAUR                    MRN:          [id number]  DATE:06/02/2006                            DOB:          1931-04-15    REASON FOR REFERRAL:  Constipation and colorectal cancer screening.   HISTORY OF PRESENT ILLNESS:  Mrs. Wunschel is a very nice 77 year old  female who relates problems with mild constipation for several years.  She has episodes of constipation that she treated with Senokot. Her  constipation has been under better control since adding more fiber foods  to her diet and drinking more fluids. She states she has had hemoccult  testing on an annual basis through Dr. Lovell Sheehan' office and these have  been negative. She underwent flexible sigmoidoscopy on several occasions  in the past with her most recent exam around 2003. There is no family  history of colon cancer, colon polyps, or inflammatory bowel disease.  She has had a mild weight loss on Weight Watchers. She notes no change  in bowel habits, change in stool caliber, melena, hematochezia, or  abdominal pain.   PAST MEDICAL HISTORY:  Hypertension, diabetes, gout, status post  hysterectomy, status post tubal ligation.   CURRENT MEDICATIONS:  Listed on the chart, updated and reviewed.   ALLERGIES:  FLOURIDE  IVP DYE   SOCIAL HISTORY:  Reviewed.   REVIEW OF SYSTEMS:  Per the handwritten form.   PHYSICAL EXAMINATION:  GENERAL: Well developed, overweight, in no acute  distress.  VITAL SIGNS: Height 5 feet 3 inches, weight 172.2 pounds, blood pressure  150/78, pulse 68 and regular.  HEENT: Anicteric sclerae, oral pharynx clear.  CHEST: Clear to auscultation bilaterally.  CARDIAC: Regular rate and rhythm without murmurs appreciated.  ABDOMEN: Soft and nontender, nondistended. Normal active bowel sounds.  No palpable organomegaly, masses, or hernias.  RECTAL: Differed to time of colonoscopy.  EXTREMITIES: Without cyanosis, clubbing, or edema.  NEUROLOGIC: Awake, alert, and oriented x3. Grossly non-focal.   ASSESSMENT ON LIMB MOVEMENT:  Mild chronic constipation. Colorectal  cancer screening. She is advised to further increase her fiber and fluid  intake. She may use an over-the-counter laxative such as milk of  magnesia or Senokot on a p.r.n. basis. The risk, benefits, and  alternatives to colonoscopy with possible biopsy and possible  polypectomy discussed with the patient and she consents to proceed. This  will be scheduled electively.     Venita Lick. Russella Dar, MD, [facility]  Electronically Signed    MTS/MedQ  DD: 06/02/2006  DT: 06/04/2006  Job #: [id number]   cc:   Stacie Glaze, MD

## 2010-10-16 ENCOUNTER — Other Ambulatory Visit (INDEPENDENT_AMBULATORY_CARE_PROVIDER_SITE_OTHER): Payer: Medicare Other

## 2010-10-16 DIAGNOSIS — I10 Essential (primary) hypertension: Secondary | ICD-10-CM

## 2010-10-16 DIAGNOSIS — E119 Type 2 diabetes mellitus without complications: Secondary | ICD-10-CM

## 2010-10-16 DIAGNOSIS — E785 Hyperlipidemia, unspecified: Secondary | ICD-10-CM

## 2010-10-16 LAB — BASIC METABOLIC PANEL
BUN: 16 mg/dL (ref 6–23)
Calcium: 9.4 mg/dL (ref 8.4–10.5)
Chloride: 100 mEq/L (ref 96–112)
Creatinine, Ser: 0.7 mg/dL (ref 0.4–1.2)
GFR: 113.75 mL/min (ref >60.00)
Glucose, Bld: 100 mg/dL — ABNORMAL HIGH (ref 70–99)
Sodium: 138 mEq/L (ref 135–145)

## 2010-10-16 LAB — LIPID PANEL
Cholesterol: 195 mg/dL (ref 0–200)
HDL: 59 mg/dL (ref >39.00)
LDL Cholesterol: 122 mg/dL — ABNORMAL HIGH (ref 0–99)
Total CHOL/HDL Ratio: 3
Triglycerides: 71 mg/dL (ref 0.0–149.0)
VLDL: 14.2 mg/dL (ref 0.0–40.0)

## 2010-10-16 LAB — HEMOGLOBIN A1C: Hgb A1c MFr Bld: 6.4 % (ref 4.6–6.5)

## 2010-10-16 LAB — TSH: TSH: 3.06 u[IU]/mL (ref 0.35–5.50)

## 2010-10-23 ENCOUNTER — Ambulatory Visit (INDEPENDENT_AMBULATORY_CARE_PROVIDER_SITE_OTHER): Payer: Medicare Other | Admitting: Internal Medicine

## 2010-10-23 ENCOUNTER — Encounter: Payer: Self-pay | Admitting: Internal Medicine

## 2010-10-23 VITALS — BP 130/70 | HR 72 | Temp 98.2°F | Resp 16 | Ht 62.5 in | Wt 152.0 lb

## 2010-10-23 DIAGNOSIS — IMO0001 Reserved for inherently not codable concepts without codable children: Secondary | ICD-10-CM

## 2010-10-23 DIAGNOSIS — E119 Type 2 diabetes mellitus without complications: Secondary | ICD-10-CM

## 2010-10-23 DIAGNOSIS — E785 Hyperlipidemia, unspecified: Secondary | ICD-10-CM

## 2010-10-23 DIAGNOSIS — Z713 Dietary counseling and surveillance: Secondary | ICD-10-CM

## 2010-10-23 DIAGNOSIS — I10 Essential (primary) hypertension: Secondary | ICD-10-CM

## 2010-10-23 DIAGNOSIS — E782 Mixed hyperlipidemia: Secondary | ICD-10-CM | POA: Insufficient documentation

## 2010-10-23 MED ORDER — TRANDOLAPRIL-VERAPAMIL HCL ER 4-240 MG PO TBCR: 1.0000 | EXTENDED_RELEASE_TABLET | Freq: Two times a day (BID) | ORAL | Status: DC

## 2010-10-23 NOTE — Progress Notes (Signed)
Subjective:    Patient ID: Haley Wilcox, female    DOB: 09/20/1931, 77 y.o.   MRN: [id number]  HPI  Patient is a 77 year old Latin American female who presents for followup of diabetes hypertension she has lost an additional 10 pounds since her last visit her A1c is stable her blood glucoses are within range and her lipid screening shows HDL and LDL balance goals.  She is doing well she's had no gouty flares of her grief reaction of the lost her husband has largely resolved she is now in Weight Watchers exercising well and is active outside the home.    Review of Systems  Constitutional: Negative for activity change, appetite change and fatigue.  HENT: Negative for ear pain, congestion, neck pain, postnasal drip and sinus pressure.   Eyes: Negative for redness and visual disturbance.  Respiratory: Negative for cough, shortness of breath and wheezing.   Gastrointestinal: Negative for abdominal pain and abdominal distention.  Genitourinary: Negative for dysuria, frequency and menstrual problem.  Musculoskeletal: Negative for myalgias, joint swelling and arthralgias.  Skin: Negative for rash and wound.  Neurological: Negative for dizziness, weakness and headaches.  Hematological: Negative for adenopathy. Does not bruise/bleed easily.  Psychiatric/Behavioral: Negative for sleep disturbance and decreased concentration.   Past Medical History  Diagnosis Date  . Gout   . Hypertension   . Diabetes mellitus   . Hyperlipidemia    Past Surgical History  Procedure Date  . Abdominal hysterectomy   . D &c   . Dilation and curettage of uterus   . Tubal ligation     reports that she has never smoked. She does not have any smokeless tobacco history on file. She reports that she does not drink alcohol or use illicit drugs. family history includes Hypertension in her mother. No Known Allergies     Objective:   Physical Exam  Constitutional: She is oriented to person, place, and time.  She appears well-developed and well-nourished. No distress.  HENT:  Head: Normocephalic and atraumatic.  Right Ear: External ear normal.  Left Ear: External ear normal.  Nose: Nose normal.  Mouth/Throat: Oropharynx is clear and moist.  Eyes: Conjunctivae and EOM are normal. Pupils are equal, round, and reactive to light.  Neck: Normal range of motion. Neck supple. No JVD present. No tracheal deviation present. No thyromegaly present.  Cardiovascular: Normal rate, regular rhythm, normal heart sounds and intact distal pulses.   No murmur heard. Pulmonary/Chest: Effort normal and breath sounds normal. She has no wheezes. She exhibits no tenderness.  Abdominal: Soft. Bowel sounds are normal.  Musculoskeletal: Normal range of motion. She exhibits no edema and no tenderness.  Lymphadenopathy:    She has no cervical adenopathy.  Neurological: She is alert and oriented to person, place, and time. She has normal reflexes. No cranial nerve deficit.  Skin: Skin is warm and dry. She is not diaphoretic.  Psychiatric: She has a normal mood and affect. Her behavior is normal.          Assessment & Plan:  Continued involvement in Weight Watchers is recommended with additional 10 pound weight loss control of her diabetes is excellent at this time continue current medications hypertension is well-controlled on her current medications and her grief reaction has resolved.  She has not had any gouty attacks while on the eye for her at all and will continue that she is no longer any need for prednisone so that medication was discontinued.  Will followup in 6 months which  will be a Medicare wellness examination

## 2011-02-19 ENCOUNTER — Telehealth: Payer: Self-pay | Admitting: *Deleted

## 2011-02-19 NOTE — Telephone Encounter (Signed)
Per dr Haley Wilcox- clear liquid diet for 24 hours if not better let us know

## 2011-02-19 NOTE — Telephone Encounter (Signed)
Patient is aware 

## 2011-02-19 NOTE — Telephone Encounter (Signed)
patient  Is calling because she was vomiting Monday and Tuesday.  She is no longer vomiting but she has a lot of air in stomach and burping a lot.  She has been using TUMS with little relief.  Any other suggestions?

## 2011-04-24 ENCOUNTER — Ambulatory Visit (INDEPENDENT_AMBULATORY_CARE_PROVIDER_SITE_OTHER): Payer: Medicare Other | Admitting: Internal Medicine

## 2011-04-24 ENCOUNTER — Encounter: Payer: Self-pay | Admitting: Internal Medicine

## 2011-04-24 VITALS — BP 146/80 | HR 72 | Temp 98.2°F | Resp 16 | Ht 62.5 in | Wt 165.0 lb

## 2011-04-24 DIAGNOSIS — E119 Type 2 diabetes mellitus without complications: Secondary | ICD-10-CM | POA: Diagnosis not present

## 2011-04-24 DIAGNOSIS — I10 Essential (primary) hypertension: Secondary | ICD-10-CM | POA: Diagnosis not present

## 2011-04-24 DIAGNOSIS — M109 Gout, unspecified: Secondary | ICD-10-CM | POA: Diagnosis not present

## 2011-04-24 MED ORDER — FUROSEMIDE 20 MG PO TABS: 20.0000 mg | ORAL_TABLET | Freq: Every day | ORAL | Status: DC

## 2011-04-24 MED ORDER — METFORMIN HCL 500 MG PO TABS: 500.0000 mg | ORAL_TABLET | Freq: Every day | ORAL | Status: DC

## 2011-04-24 MED ORDER — ALLOPURINOL 300 MG PO TABS: 300.0000 mg | ORAL_TABLET | Freq: Every day | ORAL | Status: DC

## 2011-04-24 MED ORDER — CLONAZEPAM 0.5 MG PO TABS: 0.2500 mg | ORAL_TABLET | Freq: Every evening | ORAL | Status: AC | PRN

## 2011-04-24 NOTE — Progress Notes (Signed)
Subjective:    Patient ID: Haley Wilcox, female    DOB: [birth year], 78 y.o.   MRN: [id number]  HPI  Grief reaction. Insomnia due to anxiety HTN stable No palpitations or chest pain   Review of Systems  Constitutional: Negative for activity change, appetite change and fatigue.  HENT: Negative for ear pain, congestion, neck pain, postnasal drip and sinus pressure.   Eyes: Negative for redness and visual disturbance.  Respiratory: Negative for cough, shortness of breath and wheezing.   Gastrointestinal: Negative for abdominal pain and abdominal distention.  Genitourinary: Negative for dysuria, frequency and menstrual problem.  Musculoskeletal: Negative for myalgias, joint swelling and arthralgias.  Skin: Negative for rash and wound.  Neurological: Negative for dizziness, weakness and headaches.  Hematological: Negative for adenopathy. Does not bruise/bleed easily.  Psychiatric/Behavioral: Negative for sleep disturbance and decreased concentration.   Past Medical History  Diagnosis Date  . Gout   . Hypertension   . Diabetes mellitus   . Hyperlipidemia     History   Social History  . Marital Status: Married    Spouse Name: N/A    Number of Children: N/A  . Years of Education: N/A   Occupational History  . Not on file.   Social History Main Topics  . Smoking status: Never Smoker   . Smokeless tobacco: Not on file  . Alcohol Use: No  . Drug Use: No  . Sexually Active: Yes   Other Topics Concern  . Not on file   Social History Narrative  . No narrative on file    Past Surgical History  Procedure Date  . Abdominal hysterectomy   . D &c   . Dilation and curettage of uterus   . Tubal ligation     Family History  Problem Relation Age of Onset  . Hypertension Mother     No Known Allergies  Current Outpatient Prescriptions on File Prior to Visit  Medication Sig Dispense Refill  . mometasone (ELOCON) 0.1 % cream Apply topically as needed.        .  trandolapril-verapamil (TARKA) 4-240 MG per tablet Take 1 tablet by mouth 2 (two) times daily.  180 tablet  3  . DISCONTD: allopurinol (ZYLOPRIM) 300 MG tablet Take 1 tablet (300 mg total) by mouth at bedtime.  90 tablet  3  . DISCONTD: furosemide (LASIX) 20 MG tablet Take 1 tablet (20 mg total) by mouth daily.  90 tablet  3  . DISCONTD: metFORMIN (GLUCOPHAGE) 500 MG tablet Take 1 tablet (500 mg total) by mouth daily with breakfast.  90 tablet  3    BP 146/80  Pulse 72  Temp 98.2 F (36.8 C)  Resp 16  Ht 5' 2.5" (1.588 m)  Wt 165 lb (74.844 kg)  BMI 29.70 kg/m2       Objective:   Physical Exam  Nursing note and vitals reviewed. Constitutional: She is oriented to person, place, and time. She appears well-developed and well-nourished. No distress.  HENT:  Head: Normocephalic and atraumatic.  Right Ear: External ear normal.  Left Ear: External ear normal.  Nose: Nose normal.  Mouth/Throat: Oropharynx is clear and moist.  Eyes: Conjunctivae and EOM are normal. Pupils are equal, round, and reactive to light.  Neck: Normal range of motion. Neck supple. No JVD present. No tracheal deviation present. No thyromegaly present.  Cardiovascular: Normal rate, regular rhythm, normal heart sounds and intact distal pulses.   No murmur heard. Pulmonary/Chest: Effort normal and breath sounds normal. She  has no wheezes. She exhibits no tenderness.  Abdominal: Soft. Bowel sounds are normal.  Musculoskeletal: Normal range of motion. She exhibits no edema and no tenderness.  Lymphadenopathy:    She has no cervical adenopathy.  Neurological: She is alert and oriented to person, place, and time. She has normal reflexes. No cranial nerve deficit.  Skin: Skin is warm and dry. She is not diaphoretic.  Psychiatric: She has a normal mood and affect. Her behavior is normal.          Assessment & Plan:  DM stable Insomnia due to anxiety will try clonopin trial HTN stable Follow up in 3 months

## 2011-04-24 NOTE — Patient Instructions (Signed)
The patient is instructed to continue all medications as prescribed. Schedule followup with check out clerk upon leaving the clinic  

## 2011-05-21 ENCOUNTER — Encounter: Payer: Self-pay | Admitting: Gastroenterology

## 2011-07-23 ENCOUNTER — Encounter: Payer: Self-pay | Admitting: Internal Medicine

## 2011-07-23 ENCOUNTER — Ambulatory Visit (INDEPENDENT_AMBULATORY_CARE_PROVIDER_SITE_OTHER): Payer: Medicare Other | Admitting: Internal Medicine

## 2011-07-23 VITALS — BP 140/80 | HR 72 | Temp 98.1°F | Resp 16 | Ht 62.5 in | Wt 172.0 lb

## 2011-07-23 DIAGNOSIS — E119 Type 2 diabetes mellitus without complications: Secondary | ICD-10-CM

## 2011-07-23 DIAGNOSIS — E785 Hyperlipidemia, unspecified: Secondary | ICD-10-CM

## 2011-07-23 DIAGNOSIS — T887XXA Unspecified adverse effect of drug or medicament, initial encounter: Secondary | ICD-10-CM

## 2011-07-23 DIAGNOSIS — M1A00X0 Idiopathic chronic gout, unspecified site, without tophus (tophi): Secondary | ICD-10-CM

## 2011-07-23 DIAGNOSIS — I10 Essential (primary) hypertension: Secondary | ICD-10-CM | POA: Diagnosis not present

## 2011-07-23 LAB — BASIC METABOLIC PANEL
BUN: 14 mg/dL (ref 6–23)
Calcium: 10.6 mg/dL — ABNORMAL HIGH (ref 8.4–10.5)
Creatinine, Ser: 0.6 mg/dL (ref 0.4–1.2)
GFR: 117.7 mL/min (ref >60.00)
Glucose, Bld: 94 mg/dL (ref 70–99)
Potassium: 4.3 mEq/L (ref 3.5–5.1)
Sodium: 140 mEq/L (ref 135–145)

## 2011-07-23 LAB — HEPATIC FUNCTION PANEL
AST: 18 U/L (ref 0–37)
Alkaline Phosphatase: 75 U/L (ref 39–117)
Bilirubin, Direct: 0 mg/dL (ref 0.0–0.3)
Total Bilirubin: 0.2 mg/dL — ABNORMAL LOW (ref 0.3–1.2)
Total Protein: 7.7 g/dL (ref 6.0–8.3)

## 2011-07-23 LAB — URIC ACID: Uric Acid, Serum: 4.5 mg/dL (ref 2.4–7.0)

## 2011-07-23 LAB — LIPID PANEL
HDL: 52.9 mg/dL (ref >39.00)
LDL Cholesterol: 115 mg/dL — ABNORMAL HIGH (ref 0–99)
Total CHOL/HDL Ratio: 4
Triglycerides: 96 mg/dL (ref 0.0–149.0)
VLDL: 19.2 mg/dL (ref 0.0–40.0)

## 2011-07-23 LAB — HEMOGLOBIN A1C: Hgb A1c MFr Bld: 6.5 % (ref 4.6–6.5)

## 2011-07-23 LAB — TSH: TSH: 1.94 u[IU]/mL (ref 0.35–5.50)

## 2011-07-23 NOTE — Progress Notes (Signed)
Subjective:    Patient ID: Haley Wilcox, female    DOB: 09-10-1931, 78 y.o.   MRN: [id number]  HPI  Patient is a 78 year old female who presents for followup of hyperlipidemia hypertension and adult-onset diabetes.  Her last screening blood work was done in July of this year which showed a stable hemoglobin A1c in the controlled range a good lipid panel.  She presents today for monitoring of her gout or hypertension or stable and her diabetes for which we will follow up with hemoglobin A1c today.  Review of Systems  Constitutional: Negative for activity change, appetite change and fatigue.  HENT: Negative for ear pain, congestion, neck pain, postnasal drip and sinus pressure.   Eyes: Negative for redness and visual disturbance.  Respiratory: Negative for cough, shortness of breath and wheezing.   Gastrointestinal: Negative for abdominal pain and abdominal distention.  Genitourinary: Negative for dysuria, frequency and menstrual problem.  Musculoskeletal: Negative for myalgias, joint swelling and arthralgias.  Skin: Negative for rash and wound.  Neurological: Negative for dizziness, weakness and headaches.  Hematological: Negative for adenopathy. Does not bruise/bleed easily.  Psychiatric/Behavioral: Negative for sleep disturbance and decreased concentration.   Past Medical History  Diagnosis Date  . Gout   . Hypertension   . Diabetes mellitus   . Hyperlipidemia     History   Social History  . Marital Status: Married    Spouse Name: N/A    Number of Children: N/A  . Years of Education: N/A   Occupational History  . Not on file.   Social History Main Topics  . Smoking status: Never Smoker   . Smokeless tobacco: Not on file  . Alcohol Use: No  . Drug Use: No  . Sexually Active: Yes   Other Topics Concern  . Not on file   Social History Narrative  . No narrative on file    Past Surgical History  Procedure Date  . Abdominal hysterectomy   . D &c   .  Dilation and curettage of uterus   . Tubal ligation     Family History  Problem Relation Age of Onset  . Hypertension Mother     No Known Allergies  Current Outpatient Prescriptions on File Prior to Visit  Medication Sig Dispense Refill  . allopurinol (ZYLOPRIM) 300 MG tablet Take 1 tablet (300 mg total) by mouth at bedtime.  90 tablet  3  . furosemide (LASIX) 20 MG tablet Take 1 tablet (20 mg total) by mouth daily.  90 tablet  3  . metFORMIN (GLUCOPHAGE) 500 MG tablet Take 1 tablet (500 mg total) by mouth daily with breakfast.  90 tablet  3  . mometasone (ELOCON) 0.1 % cream Apply topically as needed.        . trandolapril-verapamil (TARKA) 4-240 MG per tablet Take 1 tablet by mouth 2 (two) times daily.  180 tablet  3    BP 140/80  Pulse 72  Temp 98.1 F (36.7 C)  Resp 16  Ht 5' 2.5" (1.588 m)  Wt 172 lb (78.019 kg)  BMI 30.96 kg/m2        Objective:   Physical Exam  Constitutional: She is oriented to person, place, and time. She appears well-developed and well-nourished. No distress.  HENT:  Head: Normocephalic and atraumatic.  Right Ear: External ear normal.  Left Ear: External ear normal.  Nose: Nose normal.  Mouth/Throat: Oropharynx is clear and moist.  Eyes: Conjunctivae and EOM are normal. Pupils are equal, round, and  reactive to light.  Neck: Normal range of motion. Neck supple. No JVD present. No tracheal deviation present. No thyromegaly present.  Cardiovascular: Normal rate, regular rhythm, normal heart sounds and intact distal pulses.   No murmur heard. Pulmonary/Chest: Effort normal and breath sounds normal. She has no wheezes. She exhibits no tenderness.  Abdominal: Soft. Bowel sounds are normal.  Musculoskeletal: Normal range of motion. She exhibits no edema and no tenderness.  Lymphadenopathy:    She has no cervical adenopathy.  Neurological: She is alert and oriented to person, place, and time. She has normal reflexes. No cranial nerve deficit.    Skin: Skin is warm and dry. She is not diaphoretic.  Psychiatric: She has a normal mood and affect. Her behavior is normal.          Assessment & Plan:  Today we're drawing a hemoglobin A1c a thyroid panel a lipid and a liver as well as a uric acid level.  This will allow [place] to assess her control on her current medications she is stable no medications and has been exercising regularly walking her pets.  She appears to be in good health and good spirits and we will follow her up in

## 2011-07-23 NOTE — Patient Instructions (Signed)
The patient is instructed to continue all medications as prescribed. Schedule followup with check out clerk upon leaving the clinic  

## 2011-08-18 DIAGNOSIS — H40019 Open angle with borderline findings, low risk, unspecified eye: Secondary | ICD-10-CM | POA: Diagnosis not present

## 2011-08-18 DIAGNOSIS — H26499 Other secondary cataract, unspecified eye: Secondary | ICD-10-CM | POA: Diagnosis not present

## 2011-08-18 DIAGNOSIS — H35379 Puckering of macula, unspecified eye: Secondary | ICD-10-CM | POA: Diagnosis not present

## 2011-08-18 DIAGNOSIS — H35359 Cystoid macular degeneration, unspecified eye: Secondary | ICD-10-CM | POA: Diagnosis not present

## 2011-08-27 DIAGNOSIS — E11339 Type 2 diabetes mellitus with moderate nonproliferative diabetic retinopathy without macular edema: Secondary | ICD-10-CM | POA: Diagnosis not present

## 2011-08-27 DIAGNOSIS — H26499 Other secondary cataract, unspecified eye: Secondary | ICD-10-CM | POA: Diagnosis not present

## 2011-08-27 DIAGNOSIS — E1139 Type 2 diabetes mellitus with other diabetic ophthalmic complication: Secondary | ICD-10-CM | POA: Diagnosis not present

## 2011-12-16 ENCOUNTER — Other Ambulatory Visit: Payer: Self-pay | Admitting: Internal Medicine

## 2012-01-13 ENCOUNTER — Encounter: Payer: Self-pay | Admitting: Gastroenterology

## 2012-01-25 ENCOUNTER — Ambulatory Visit (INDEPENDENT_AMBULATORY_CARE_PROVIDER_SITE_OTHER): Payer: Medicare Other | Admitting: Internal Medicine

## 2012-01-25 ENCOUNTER — Encounter: Payer: Self-pay | Admitting: Internal Medicine

## 2012-01-25 VITALS — BP 140/74 | HR 72 | Temp 98.2°F | Resp 16 | Ht 62.5 in | Wt 172.0 lb

## 2012-01-25 DIAGNOSIS — T887XXA Unspecified adverse effect of drug or medicament, initial encounter: Secondary | ICD-10-CM | POA: Diagnosis not present

## 2012-01-25 DIAGNOSIS — IMO0001 Reserved for inherently not codable concepts without codable children: Secondary | ICD-10-CM

## 2012-01-25 DIAGNOSIS — E785 Hyperlipidemia, unspecified: Secondary | ICD-10-CM | POA: Diagnosis not present

## 2012-01-25 LAB — LDL CHOLESTEROL, DIRECT: Direct LDL: 132.7 mg/dL

## 2012-01-25 LAB — LIPID PANEL: VLDL: 27 mg/dL (ref 0.0–40.0)

## 2012-01-25 LAB — HEPATIC FUNCTION PANEL
ALT: 14 U/L (ref 0–35)
AST: 18 U/L (ref 0–37)
Albumin: 4 g/dL (ref 3.5–5.2)
Bilirubin, Direct: 0 mg/dL (ref 0.0–0.3)
Total Bilirubin: 0.3 mg/dL (ref 0.3–1.2)
Total Protein: 7.6 g/dL (ref 6.0–8.3)

## 2012-01-25 NOTE — Patient Instructions (Signed)
The patient is instructed to continue all medications as prescribed. Schedule followup with check out clerk upon leaving the clinic  

## 2012-01-25 NOTE — Progress Notes (Signed)
Subjective:    Patient ID: Haley Wilcox, female    DOB: 06/04/1931, 78 y.o.   MRN: [id number]  HPI PRESENTS FOR FOLLOW UP OF DM AND HTN General he feels well her weight is stable her blood pressure stable she is compliant with her medications she overdosed on monitor blood glucoses   Review of Systems  Constitutional: Negative for activity change, appetite change and fatigue.  HENT: Negative for ear pain, congestion, neck pain, postnasal drip and sinus pressure.   Eyes: Negative for redness and visual disturbance.  Respiratory: Negative for cough, shortness of breath and wheezing.   Gastrointestinal: Negative for abdominal pain and abdominal distention.  Genitourinary: Negative for dysuria, frequency and menstrual problem.  Musculoskeletal: Negative for myalgias, joint swelling and arthralgias.  Skin: Negative for rash and wound.  Neurological: Negative for dizziness, weakness and headaches.  Hematological: Negative for adenopathy. Does not bruise/bleed easily.  Psychiatric/Behavioral: Negative for sleep disturbance and decreased concentration.   Past Medical History  Diagnosis Date  . Gout   . Hypertension   . Diabetes mellitus   . Hyperlipidemia     History   Social History  . Marital Status: Married    Spouse Name: N/A    Number of Children: N/A  . Years of Education: N/A   Occupational History  . Not on file.   Social History Main Topics  . Smoking status: Never Smoker   . Smokeless tobacco: Not on file  . Alcohol Use: No  . Drug Use: No  . Sexually Active: Yes   Other Topics Concern  . Not on file   Social History Narrative  . No narrative on file    Past Surgical History  Procedure Date  . Abdominal hysterectomy   . D &c   . Dilation and curettage of uterus   . Tubal ligation     Family History  Problem Relation Age of Onset  . Hypertension Mother     No Known Allergies  Current Outpatient Prescriptions on File Prior to Visit    Medication Sig Dispense Refill  . allopurinol (ZYLOPRIM) 300 MG tablet Take 1 tablet (300 mg total) by mouth at bedtime.  90 tablet  3  . furosemide (LASIX) 20 MG tablet Take 1 tablet (20 mg total) by mouth daily.  90 tablet  3  . metFORMIN (GLUCOPHAGE) 500 MG tablet Take 1 tablet (500 mg total) by mouth daily with breakfast.  90 tablet  3  . mometasone (ELOCON) 0.1 % cream Apply topically as needed.        Marland Kitchen TARKA 4-240 MG per tablet TAKE 1 TABLET BY MOUTH 2 TIMES DAILY  180 tablet  1    BP 140/74  Pulse 72  Temp 98.2 F (36.8 C)  Resp 16  Ht 5' 2.5" (1.588 m)  Wt 172 lb (78.019 kg)  BMI 30.96 kg/m2       Objective:   Physical Exam  Nursing note and vitals reviewed. Constitutional: She is oriented to person, place, and time. She appears well-developed and well-nourished. No distress.  HENT:  Head: Normocephalic and atraumatic.  Right Ear: External ear normal.  Left Ear: External ear normal.  Nose: Nose normal.  Mouth/Throat: Oropharynx is clear and moist.  Eyes: Conjunctivae normal and EOM are normal. Pupils are equal, round, and reactive to light.  Neck: Normal range of motion. Neck supple. No JVD present. No tracheal deviation present. No thyromegaly present.  Cardiovascular: Normal rate, regular rhythm, normal heart sounds and intact  distal pulses.   No murmur heard. Pulmonary/Chest: Effort normal and breath sounds normal. She has no wheezes. She exhibits no tenderness.  Abdominal: Soft. Bowel sounds are normal.  Musculoskeletal: Normal range of motion. She exhibits no edema and no tenderness.  Lymphadenopathy:    She has no cervical adenopathy.  Neurological: She is alert and oriented to person, place, and time. She has normal reflexes. No cranial nerve deficit.  Skin: Skin is warm and dry. She is not diaphoretic.  Psychiatric: She has a normal mood and affect. Her behavior is normal.          Assessment & Plan:  Stable blood pressure.   On a candidate for  lipid control and diabetic control with appropriate blood work including a lipid panel and a hemoglobin A1c We will also monitor her liver function

## 2012-02-25 DIAGNOSIS — H43819 Vitreous degeneration, unspecified eye: Secondary | ICD-10-CM | POA: Diagnosis not present

## 2012-02-25 DIAGNOSIS — E11339 Type 2 diabetes mellitus with moderate nonproliferative diabetic retinopathy without macular edema: Secondary | ICD-10-CM | POA: Diagnosis not present

## 2012-02-25 DIAGNOSIS — E1165 Type 2 diabetes mellitus with hyperglycemia: Secondary | ICD-10-CM | POA: Diagnosis not present

## 2012-02-25 DIAGNOSIS — H353190 Nonexudative age-related macular degeneration, unspecified eye, stage unspecified: Secondary | ICD-10-CM | POA: Diagnosis not present

## 2012-02-25 DIAGNOSIS — E1139 Type 2 diabetes mellitus with other diabetic ophthalmic complication: Secondary | ICD-10-CM | POA: Diagnosis not present

## 2012-02-25 DIAGNOSIS — H35049 Retinal micro-aneurysms, unspecified, unspecified eye: Secondary | ICD-10-CM | POA: Diagnosis not present

## 2012-03-19 ENCOUNTER — Other Ambulatory Visit: Payer: Self-pay | Admitting: Internal Medicine

## 2012-03-24 ENCOUNTER — Other Ambulatory Visit: Payer: Self-pay | Admitting: Internal Medicine

## 2012-04-27 ENCOUNTER — Other Ambulatory Visit: Payer: Self-pay | Admitting: Internal Medicine

## 2012-05-25 ENCOUNTER — Ambulatory Visit (INDEPENDENT_AMBULATORY_CARE_PROVIDER_SITE_OTHER): Payer: Medicare Other | Admitting: Internal Medicine

## 2012-05-25 ENCOUNTER — Encounter: Payer: Self-pay | Admitting: Internal Medicine

## 2012-05-25 VITALS — BP 130/84 | HR 72 | Temp 98.3°F | Resp 16 | Ht 62.5 in | Wt 174.0 lb

## 2012-05-25 DIAGNOSIS — E785 Hyperlipidemia, unspecified: Secondary | ICD-10-CM

## 2012-05-25 DIAGNOSIS — E119 Type 2 diabetes mellitus without complications: Secondary | ICD-10-CM

## 2012-05-25 DIAGNOSIS — I10 Essential (primary) hypertension: Secondary | ICD-10-CM

## 2012-05-25 NOTE — Progress Notes (Signed)
  Subjective:    Patient ID: Haley Wilcox, female    DOB: [birth year], 79 y.o.   MRN: [id number]  HPI Old female who is followed for hypertension hyperlipidemia with a history of gout.  She also has a history of type 2 diabetes well controlled her last hemoglobin A1c was in the mid sixes   Review of Systems  Constitutional: Negative for activity change, appetite change and fatigue.  HENT: Negative for ear pain, congestion, neck pain, postnasal drip and sinus pressure.   Eyes: Negative for redness and visual disturbance.  Respiratory: Negative for cough, shortness of breath and wheezing.   Gastrointestinal: Negative for abdominal pain and abdominal distention.  Genitourinary: Negative for dysuria, frequency and menstrual problem.  Musculoskeletal: Negative for myalgias, joint swelling and arthralgias.  Skin: Negative for rash and wound.  Neurological: Negative for dizziness, weakness and headaches.  Hematological: Negative for adenopathy. Does not bruise/bleed easily.  Psychiatric/Behavioral: Negative for sleep disturbance and decreased concentration.       Objective:   Physical Exam  Nursing note and vitals reviewed. Constitutional: She appears well-developed and well-nourished.  Cardiovascular: Normal rate and regular rhythm.   Pulmonary/Chest: Effort normal and breath sounds normal.  Abdominal: Soft.  Skin: Skin is warm and dry.          Assessment & Plan:  Type 2 diabetes is stable stable hemoglobin A1c we'll repeat hemoglobin A1c at that time for Medicare wellness examination in approximately 4 months.  Her blood pressure stable on her current regimen She continues to be active alert. Thank you for the degree of engagement in her community and her activity levels are the secret to her longevity

## 2012-05-25 NOTE — Patient Instructions (Signed)
The patient is instructed to continue all medications as prescribed. Schedule followup with check out clerk upon leaving the clinic  

## 2012-06-01 ENCOUNTER — Encounter (HOSPITAL_COMMUNITY): Payer: Self-pay | Admitting: *Deleted

## 2012-06-01 ENCOUNTER — Inpatient Hospital Stay (HOSPITAL_COMMUNITY)
Admission: EM | Admit: 2012-06-01 | Discharge: 2012-06-06 | DRG: 392 | Disposition: A | Discharge status: Home / Self Care | Payer: Medicare Other | Attending: Internal Medicine | Admitting: Internal Medicine

## 2012-06-01 ENCOUNTER — Emergency Department (HOSPITAL_COMMUNITY): Payer: Medicare Other

## 2012-06-01 DIAGNOSIS — I10 Essential (primary) hypertension: Secondary | ICD-10-CM | POA: Diagnosis present

## 2012-06-01 DIAGNOSIS — R109 Unspecified abdominal pain: Secondary | ICD-10-CM | POA: Diagnosis not present

## 2012-06-01 DIAGNOSIS — D72829 Elevated white blood cell count, unspecified: Secondary | ICD-10-CM

## 2012-06-01 DIAGNOSIS — E119 Type 2 diabetes mellitus without complications: Secondary | ICD-10-CM | POA: Diagnosis present

## 2012-06-01 DIAGNOSIS — N179 Acute kidney failure, unspecified: Secondary | ICD-10-CM | POA: Diagnosis present

## 2012-06-01 DIAGNOSIS — K5900 Constipation, unspecified: Secondary | ICD-10-CM

## 2012-06-01 DIAGNOSIS — R000 Tachycardia, unspecified: Secondary | ICD-10-CM

## 2012-06-01 DIAGNOSIS — E872 Acidosis, unspecified: Secondary | ICD-10-CM

## 2012-06-01 DIAGNOSIS — M109 Gout, unspecified: Secondary | ICD-10-CM | POA: Diagnosis not present

## 2012-06-01 DIAGNOSIS — R7402 Elevation of levels of lactic acid dehydrogenase (LDH): Secondary | ICD-10-CM | POA: Diagnosis not present

## 2012-06-01 DIAGNOSIS — I498 Other specified cardiac arrhythmias: Secondary | ICD-10-CM | POA: Diagnosis present

## 2012-06-01 DIAGNOSIS — D509 Iron deficiency anemia, unspecified: Secondary | ICD-10-CM | POA: Diagnosis present

## 2012-06-01 DIAGNOSIS — M1A00X0 Idiopathic chronic gout, unspecified site, without tophus (tophi): Secondary | ICD-10-CM | POA: Diagnosis not present

## 2012-06-01 DIAGNOSIS — Z79899 Other long term (current) drug therapy: Secondary | ICD-10-CM

## 2012-06-01 DIAGNOSIS — E876 Hypokalemia: Secondary | ICD-10-CM | POA: Diagnosis not present

## 2012-06-01 DIAGNOSIS — E871 Hypo-osmolality and hyponatremia: Secondary | ICD-10-CM

## 2012-06-01 DIAGNOSIS — K5732 Diverticulitis of large intestine without perforation or abscess without bleeding: Secondary | ICD-10-CM | POA: Diagnosis not present

## 2012-06-01 DIAGNOSIS — M10062 Idiopathic gout, left knee: Secondary | ICD-10-CM

## 2012-06-01 DIAGNOSIS — R110 Nausea: Secondary | ICD-10-CM | POA: Diagnosis not present

## 2012-06-01 DIAGNOSIS — R030 Elevated blood-pressure reading, without diagnosis of hypertension: Secondary | ICD-10-CM | POA: Diagnosis not present

## 2012-06-01 DIAGNOSIS — E785 Hyperlipidemia, unspecified: Secondary | ICD-10-CM

## 2012-06-01 LAB — COMPREHENSIVE METABOLIC PANEL
ALT: 27 U/L (ref 0–35)
Alkaline Phosphatase: 102 U/L (ref 39–117)
BUN: 25 mg/dL — ABNORMAL HIGH (ref 6–23)
Chloride: 98 mEq/L (ref 96–112)
GFR calc Af Amer: >90 mL/min (ref >90)
Glucose, Bld: 183 mg/dL — ABNORMAL HIGH (ref 70–99)
Potassium: 2.7 mEq/L — CL (ref 3.5–5.1)
Sodium: 140 mEq/L (ref 135–145)
Total Bilirubin: 0.4 mg/dL (ref 0.3–1.2)
Total Protein: 8.4 g/dL — ABNORMAL HIGH (ref 6.0–8.3)

## 2012-06-01 LAB — URINALYSIS, ROUTINE W REFLEX MICROSCOPIC
Glucose, UA: NEGATIVE mg/dL
Ketones, ur: NEGATIVE mg/dL
Nitrite: NEGATIVE
Protein, ur: NEGATIVE mg/dL
pH: 7 (ref 5.0–8.0)

## 2012-06-01 LAB — URINE MICROSCOPIC-ADD ON

## 2012-06-01 LAB — CBC
HCT: 44.1 % (ref 36.0–46.0)
Hemoglobin: 14.8 g/dL (ref 12.0–15.0)
MCHC: 33.6 g/dL (ref 30.0–36.0)
RBC: 5.25 MIL/uL — ABNORMAL HIGH (ref 3.87–5.11)
WBC: 25.1 10*3/uL — ABNORMAL HIGH (ref 4.0–10.5)

## 2012-06-01 LAB — APTT: aPTT: 27 seconds (ref 24–37)

## 2012-06-01 LAB — PROTIME-INR: INR: 0.98 (ref 0.00–1.49)

## 2012-06-01 MED ORDER — POTASSIUM CHLORIDE 10 MEQ/100ML IV SOLN
10.0000 meq | INTRAVENOUS | Status: DC
  Administered 2012-06-01 (×3): 10 meq via INTRAVENOUS
  Filled 2012-06-01 (×3): qty 100

## 2012-06-01 MED ORDER — CIPROFLOXACIN IN D5W 400 MG/200ML IV SOLN
400.0000 mg | Freq: Two times a day (BID) | INTRAVENOUS | Status: DC
  Administered 2012-06-02 – 2012-06-05 (×7): 400 mg via INTRAVENOUS
  Filled 2012-06-01 (×7): qty 200

## 2012-06-01 MED ORDER — INSULIN ASPART 100 UNIT/ML ~~LOC~~ SOLN
0.0000 [IU] | SUBCUTANEOUS | Status: DC
  Administered 2012-06-01: 3 [IU] via SUBCUTANEOUS
  Administered 2012-06-01: 2 [IU] via SUBCUTANEOUS
  Administered 2012-06-02 (×2): 1 [IU] via SUBCUTANEOUS
  Administered 2012-06-05: 3 [IU] via SUBCUTANEOUS
  Administered 2012-06-05: 1 [IU] via SUBCUTANEOUS

## 2012-06-01 MED ORDER — SODIUM CHLORIDE 0.9 % IV BOLUS (SEPSIS)
1000.0000 mL | Freq: Once | INTRAVENOUS | Status: AC
  Administered 2012-06-01: 1000 mL via INTRAVENOUS

## 2012-06-01 MED ORDER — ACETAMINOPHEN 650 MG RE SUPP: 650.0000 mg | Freq: Four times a day (QID) | RECTAL | Status: DC | PRN

## 2012-06-01 MED ORDER — ONDANSETRON HCL 4 MG/2ML IJ SOLN
4.0000 mg | Freq: Once | INTRAMUSCULAR | Status: AC
  Administered 2012-06-01: 4 mg via INTRAVENOUS
  Filled 2012-06-01: qty 2

## 2012-06-01 MED ORDER — SODIUM CHLORIDE 0.9 % IV SOLN
INTRAVENOUS | Status: DC
  Administered 2012-06-01 – 2012-06-04 (×5): via INTRAVENOUS

## 2012-06-01 MED ORDER — SODIUM CHLORIDE 0.9 % IV SOLN
Freq: Once | INTRAVENOUS | Status: AC
  Administered 2012-06-01: 16:00:00 via INTRAVENOUS

## 2012-06-01 MED ORDER — CIPROFLOXACIN IN D5W 400 MG/200ML IV SOLN
400.0000 mg | Freq: Once | INTRAVENOUS | Status: DC
  Administered 2012-06-01: 400 mg via INTRAVENOUS
  Filled 2012-06-01: qty 200

## 2012-06-01 MED ORDER — HYDROMORPHONE HCL PF 1 MG/ML IJ SOLN: 1.0000 mg | INTRAMUSCULAR | Status: DC | PRN

## 2012-06-01 MED ORDER — ONDANSETRON HCL 4 MG/2ML IJ SOLN: 4.0000 mg | Freq: Four times a day (QID) | INTRAMUSCULAR | Status: DC | PRN

## 2012-06-01 MED ORDER — METRONIDAZOLE IN NACL 5-0.79 MG/ML-% IV SOLN
500.0000 mg | Freq: Three times a day (TID) | INTRAVENOUS | Status: DC
  Administered 2012-06-02 – 2012-06-05 (×10): 500 mg via INTRAVENOUS
  Filled 2012-06-01 (×13): qty 100

## 2012-06-01 MED ORDER — HYDRALAZINE HCL 20 MG/ML IJ SOLN
10.0000 mg | Freq: Once | INTRAMUSCULAR | Status: AC
  Administered 2012-06-01: 10 mg via INTRAVENOUS
  Filled 2012-06-01: qty 1

## 2012-06-01 MED ORDER — ENOXAPARIN SODIUM 40 MG/0.4ML ~~LOC~~ SOLN
40.0000 mg | Freq: Every day | SUBCUTANEOUS | Status: DC
  Administered 2012-06-01 – 2012-06-05 (×5): 40 mg via SUBCUTANEOUS
  Filled 2012-06-01 (×7): qty 0.4

## 2012-06-01 MED ORDER — METRONIDAZOLE IN NACL 5-0.79 MG/ML-% IV SOLN
500.0000 mg | Freq: Once | INTRAVENOUS | Status: DC
  Administered 2012-06-01: 500 mg via INTRAVENOUS
  Filled 2012-06-01: qty 100

## 2012-06-01 MED ORDER — ACETAMINOPHEN 325 MG PO TABS
650.0000 mg | ORAL_TABLET | Freq: Four times a day (QID) | ORAL | Status: DC | PRN
Start: 2012-06-01 — End: 2012-06-06
  Administered 2012-06-02: 650 mg via ORAL
  Filled 2012-06-01: qty 2

## 2012-06-01 NOTE — ED Provider Notes (Signed)
History     CSN: [id number]  Arrival date & time 06/01/12  1203   First MD Initiated Contact with Patient 06/01/12 1246      Chief Complaint  Patient presents with  . Emesis  . Constipation    (Consider location/radiation/quality/duration/timing/severity/associated sxs/prior treatment) HPI Comments: Ms. Livingston presents with her daughter for evaluation.  She reports she has a history of chronic constipation.  It has been 1 week since she last had a bowel movement.  She began having abdominal cramping and nausea today.  Patient is a 79 y.o. female presenting with constipation. The history is provided by the patient. No language interpreter was used.  Constipation  The current episode started more than 1 week ago. The onset was gradual. The problem occurs continuously. The problem has been gradually worsening. The pain is mild. The stool is described as hard. Prior successful therapies include laxatives and enemas. Prior unsuccessful therapies include laxatives and enemas. Associated symptoms include abdominal pain, nausea, vomiting and headaches. Pertinent negatives include no anorexia, no fever, no diarrhea, no hematemesis, no hemorrhoids, no rectal pain, no hematuria, no chest pain, no coughing, no difficulty breathing and no rash.    Past Medical History  Diagnosis Date  . Gout   . Hypertension   . Diabetes mellitus   . Hyperlipidemia     Past Surgical History  Procedure Laterality Date  . Abdominal hysterectomy    . D &c    . Dilation and curettage of uterus    . Tubal ligation      Family History  Problem Relation Age of Onset  . Hypertension Mother     History  Substance Use Topics  . Smoking status: Never Smoker   . Smokeless tobacco: Not on file  . Alcohol Use: No    OB History   Grav Para Term Preterm Abortions TAB SAB Ect Mult Living                  Review of Systems  Constitutional: Negative for fever, diaphoresis, activity change, appetite change  and fatigue.  Eyes: Negative.   Respiratory: Negative for cough, chest tightness, shortness of breath and wheezing.   Cardiovascular: Negative for chest pain, palpitations and leg swelling.  Gastrointestinal: Positive for nausea, vomiting, abdominal pain and constipation. Negative for diarrhea, blood in stool, anal bleeding, rectal pain, anorexia, hematemesis and hemorrhoids.  Genitourinary: Negative for urgency, hematuria and flank pain.  Musculoskeletal: Negative for back pain, joint swelling and gait problem.  Skin: Negative for pallor, rash and wound.  Neurological: Positive for headaches.  Hematological: Negative.   Psychiatric/Behavioral: Negative.     Allergies  Fluoride preparations and Ivp dye  Home Medications   Current Outpatient Rx  Name  Route  Sig  Dispense  Refill  . allopurinol (ZYLOPRIM) 300 MG tablet      TAKE 1 TABLET (300MG  TOTAL) BY MOUTH AT BEDTIME   90 tablet   0   . furosemide (LASIX) 20 MG tablet      TAKE 1 TABLET (20MG  TOTAL) BY MOUTH DAILY   90 tablet   0   . metFORMIN (GLUCOPHAGE) 500 MG tablet      TAKE 1 TABLET (500MG  TOTAL) BY MOUTH DAILY WITH BREAKFAST   90 tablet   0   . mometasone (ELOCON) 0.1 % cream   Topical   Apply topically as needed.           Marland Kitchen TARKA 4-240 MG per tablet  TAKE 1 TABLET TWICE A DAY   180 tablet   0     BP 124/75  Pulse 125  Temp(Src) 99.5 F (37.5 C) (Oral)  Resp 18  SpO2 92%  Physical Exam  Nursing note and vitals reviewed. Constitutional: She is oriented to person, place, and time. She appears well-developed and well-nourished. No distress.  HENT:  Head: Normocephalic and atraumatic.  Right Ear: External ear normal.  Left Ear: External ear normal.  Nose: Nose normal.  Mouth/Throat: Oropharynx is clear and moist. No oropharyngeal exudate.  Eyes: Conjunctivae are normal. Pupils are equal, round, and reactive to light. Right eye exhibits no discharge. Left eye exhibits no discharge. No  scleral icterus.  Neck: Normal range of motion. Neck supple. No JVD present. No tracheal deviation present.  Cardiovascular: Regular rhythm and normal heart sounds.  Tachycardia present.  PMI is not displaced.  Exam reveals no gallop.   No murmur heard. Pulmonary/Chest: Effort normal and breath sounds normal. No stridor. No respiratory distress. She has no wheezes. She has no rales. She exhibits no tenderness.  Abdominal: Soft. Normal appearance. She exhibits no distension and no mass. Bowel sounds are increased. There is no hepatosplenomegaly. There is no tenderness. There is no rigidity, no rebound, no guarding, no CVA tenderness, no tenderness at McBurney's point and negative Murphy's sign. No hernia.  Musculoskeletal: Normal range of motion. She exhibits no edema and no tenderness.  Lymphadenopathy:    She has no cervical adenopathy.  Neurological: She is oriented to person, place, and time. No cranial nerve deficit.  Skin: Skin is warm and dry. No rash noted. She is not diaphoretic. No erythema. No pallor.  Psychiatric: She has a normal mood and affect. Her behavior is normal.    ED Course  Procedures (including critical care time)  Labs Reviewed  CBC  COMPREHENSIVE METABOLIC PANEL   No results found.   No diagnosis found.   Date: 06/01/2012 @ 1446  Rate: 111 bpm  Rhythm: sinus tachycardia  QRS Axis: normal  Intervals: normal  ST/T Wave abnormalities: nonspecific ST changes  Conduction Disutrbances:none  Narrative Interpretation:   Old EKG Reviewed: none available      MDM  Pt presents for evaluation of constipation and nausea.  She appears uncomfortable but nontoxic, note elevated HR, NAD.  She has no clinical evidence of peritonitis and on arrival to the ER had 1 large, loose BM (after home enema and dulcolax).  Will obtain a KUB and basic labs.  Will reassess.  1530.  Pt has continued tachycardia and abdominal cramping.  She has experienced multiple loose bowel  movements.  Note a significant leukocytosis with nl H&H.  She also has an elevated lactic acid.  Will check a rectal temperature .  Ordered IV potassium for repletion of hypokalemia.  Also ordered a CT scan of the abdomen and pelvis with PO contrast (she has an IV contrast allergy).  Pt signed over to Dr. Juleen China pending the results of the CT.       Tobin Chad, MD 06/01/12 1539

## 2012-06-01 NOTE — H&P (Signed)
[facility] History and Physical  Haley Wilcox NWG:[id number] DOB: 1931/09/17 DOA: 06/01/2012  Referring physician: EDP PCP: Carrie Mew, MD    Chief Complaint: vomiting  HPI: Haley Wilcox is a 79 y.o. female with h/o DM, HTN presents to the ER today with vomiting. She was in her usual state of health until 2 weeks ago, she had been dealing with severe constipation for the last 2 weeks, tried some home remedies, including dulcolax without much benefit. Last night, tried 3 dulcolax and then started experiencing abdominal cramps after this, then around 4 am started vomiting, and vomited 4-5 times, vomitus was non bloody or bilious. Finally this morning her daughter gave her an enema, after which she started having BMs and has had several BMs some loose and some with solid stool. Her daughter checked and found that her heart rate was very high got concerned and brought her to the ER. Upon eval in ER she has been tachycardic, WBC of 25K, lactic acid of 5.6 and CT Abd pelvis with colon wall thickening consistent with diverticulitis   Review of Systems:  The patient denies anorexia, fever, weight loss,, vision loss, decreased hearing, hoarseness, chest pain, syncope, dyspnea on exertion, peripheral edema, balance deficits, hemoptysis, abdominal pain, melena, hematochezia, severe indigestion/heartburn, hematuria, incontinence, genital sores, muscle weakness, suspicious skin lesions, transient blindness, difficulty walking, depression, unusual weight change, abnormal bleeding, enlarged lymph nodes, angioedema, and breast masses.    Past Medical History  Diagnosis Date  . Gout   . Hypertension   . Diabetes mellitus   . Hyperlipidemia    Past Surgical History  Procedure Laterality Date  . Abdominal hysterectomy    . D &c    . Dilation and curettage of uterus    . Tubal ligation    . Eye surgery      bilateral cataract   Social History:  reports that she has never  smoked. She has never used smokeless tobacco. She reports that she does not drink alcohol or use illicit drugs. Lives at home by herself  Allergies  Allergen Reactions  . Fluoride Preparations   . Ivp Dye (Iodinated Diagnostic Agents)     Family History  Problem Relation Age of Onset  . Hypertension Mother    Prior to Admission medications   Medication Sig Start Date End Date Taking? Authorizing Provider  allopurinol (ZYLOPRIM) 300 MG tablet TAKE 1 TABLET (300MG  TOTAL) BY MOUTH AT BEDTIME 03/19/12  Yes Stacie Glaze, MD  furosemide (LASIX) 20 MG tablet TAKE 1 TABLET (20MG  TOTAL) BY MOUTH DAILY 03/24/12  Yes Stacie Glaze, MD  metFORMIN (GLUCOPHAGE) 500 MG tablet TAKE 1 TABLET (500MG  TOTAL) BY MOUTH DAILY WITH BREAKFAST 03/19/12  Yes Stacie Glaze, MD  mometasone (ELOCON) 0.1 % cream Apply topically as needed.     Yes Historical Provider, MD  TARKA 4-240 MG per tablet TAKE 1 TABLET TWICE A DAY 04/27/12  Yes Stacie Glaze, MD   Physical Exam: Filed Vitals:   06/01/12 1505 06/01/12 1555 06/01/12 1600 06/01/12 1630  BP: 186/96  189/86 185/81  Pulse: 104  102 104  Temp:  99.4 F (37.4 C)    TempSrc:  Rectal    Resp: 25  26 26   SpO2: 96%  95% 94%     General:  AAOx3, uncomfortable appearing  HEENT: PERRLA, EOMI, no JVD  Cardiovascular: S1S2/RRR  Respiratory: CTAB  Abdomen: soft, distended, tender in LLQ, no rigidity, positive rebound, BS present  Skin: no rashes  Musculoskeletal: no synovitis  Psychiatric: appropriate mood and affect  Neurologic: non focal  Labs on Admission:  Basic Metabolic Panel:  Recent Labs Lab 06/01/12 1314  NA 140  K 2.7*  CL 98  CO2 23  GLUCOSE 183*  BUN 25*  CREATININE 0.75  CALCIUM 12.6*   Liver Function Tests:  Recent Labs Lab 06/01/12 1314  AST 40*  ALT 27  ALKPHOS 102  BILITOT 0.4  PROT 8.4*  ALBUMIN 4.3   No results found for this basename: LIPASE, AMYLASE,  in the last 168 hours No results found for this  basename: AMMONIA,  in the last 168 hours CBC:  Recent Labs Lab 06/01/12 1314  WBC 25.1*  HGB 14.8  HCT 44.1  MCV 84.0  PLT 297   Cardiac Enzymes: No results found for this basename: CKTOTAL, CKMB, CKMBINDEX, TROPONINI,  in the last 168 hours  BNP (last 3 results) No results found for this basename: PROBNP,  in the last 8760 hours CBG: No results found for this basename: GLUCAP,  in the last 168 hours  Radiological Exams on Admission: Ct Abdomen Pelvis Wo Contrast  06/01/2012  *RADIOLOGY REPORT*  Clinical Data: Abdominal cramping and nausea  CT ABDOMEN AND PELVIS WITHOUT CONTRAST  Technique:  Multidetector CT imaging of the abdomen and pelvis was performed following the standard protocol without intravenous contrast.  Comparison: None.  Findings: Mild moderate cardiomegaly noted.  Curvilinear and patchy subpleural right lower lobe and lingular airspace opacities noted, likely atelectasis.  No pleural effusion.  Hepatic granulomas are noted.  Fluid density lobulated hepatic lesions, largest in the right hepatic lobe measuring 3.9 x 3.5 cm, compatible with cysts.  A small amount of contrast reflux is noted in the distal esophagus.  Unenhanced spleen, pancreas, left kidney are unremarkable.  Fluid density probable cyst at the inferior mid aspect of the right kidney measures 1.7 cm image 32.  Mild prominence of both adrenal glands is most compatible with hyperplasia.  There is long segmental left colonic wall thickening and surrounding stranding with trace fluid tracking along the left pericolic gutter.  No surrounding rim enhancing fluid collection is identified to suggest abscess.  Diverticuli are noted.  Small bowel is unremarkable.  The appendix is normal.  Right ovary is normal. Left ovary is not well visualized but no left adnexal mass is seen. The bladder is decompressed but unremarkable.  Lumbar spine degenerative changes noted.  No lytic or sclerotic osseous lesion.  IMPRESSION: Long  segmental contiguous left colonic wall thickening most compatible with diverticulitis.  If the patient has not had recent colonoscopy, this would be recommended when symptoms resolve in order to exclude neoplasm.  No complicating features such as abscess or free air.   Original Report Authenticated By: Christiana Pellant, M.D.      Assessment/Plan Active Problems:   DIABETES MELLITUS, TYPE II   HYPERTENSION   Diverticulitis of sigmoid colon   1. Acute Diverticulitis: - Will start IV Ciprofloxacin and Flagyl - NPO, IVF, supportive care - lactic acid of 5.6 and WBC 25K concerning, will request Surgical opinion given risk of complication - repeat lactic acid in am - h/o colonoscopy per Dr.Stark in 2008, which was reportedly normal except for polyps  2. DM: start SSI, hold metformin  3. HTN: stable  4. Gout: hold allopurinol  Code Status: full code Family Communication: d/w pt and daughter at bedside Disposition Plan: home when better  [facility] Pager [phone]  If 7PM-7AM, please contact night-coverage [email or web address]  Password [email or web address] 06/01/2012, 6:22 PM

## 2012-06-01 NOTE — ED Notes (Signed)
Pt arrives from home with c/o n/v and constipation. Reports no BM x's 7 days. Family attempted enema with no relief in symptoms.

## 2012-06-01 NOTE — ED Notes (Signed)
Pt tolerated water without vomiting. 

## 2012-06-01 NOTE — ED Notes (Signed)
Patient transported to CT 

## 2012-06-01 NOTE — ED Provider Notes (Signed)
[facility] for patient from Dr. Lorenso Courier in sign out. CT significant for diverticulitis. No evidence of perforation or abscess. Significant leukocytosis and elevation lactic acid. Antibiotics ordered. IV fluids. Discussed with hospitalist for admission. Discussed plan with pt and daughter.   Haley Razor, MD 06/01/12 [id number]

## 2012-06-01 NOTE — Consult Note (Signed)
Reason for Consult:ab pain Referring Physician: Dr. Zannie Cove  Haley Wilcox is an 79 y.o. female.  HPI: 62 yof with history of dm, htn, tah who presents with 2 week history of worsening constipation. She has colonoscopy in 2008 that she said had a couple polyps but otherwise normal.  She has been passing flatus and small stools during this time and felt bloated.  She denies fevers.  In last 24 hours she had more bloating and then attempted some dulcolax and enemas with small hard stools. Eventually today she began having a larger volume of loose stool and larger hard stools.  She also developed abdominal pain that is significant.  She has been urinating without difficulty although less.  She is not able to eat or drink. Pain medication has relieved pain.  She is no longer having the n/v she was having earlier today.  Past Medical History  Diagnosis Date  . Gout   . Hypertension   . Diabetes mellitus   . Hyperlipidemia     Past Surgical History  Procedure Laterality Date  . Abdominal hysterectomy    . D &c    . Dilation and curettage of uterus    . Tubal ligation    . Eye surgery      bilateral cataract    Family History  Problem Relation Age of Onset  . Hypertension Mother     Social History:  reports that she has never smoked. She has never used smokeless tobacco. She reports that she does not drink alcohol or use illicit drugs.  Allergies:  Allergies  Allergen Reactions  . Fluoride Preparations   . Ivp Dye (Iodinated Diagnostic Agents)     Medications: reviewed  Results for orders placed during the hospital encounter of 06/01/12 (from the past 48 hour(s))  CBC     Status: Abnormal   Collection Time    06/01/12  1:14 PM      Result Value Range   WBC 25.1 (*) 4.0 - 10.5 K/uL   RBC 5.25 (*) 3.87 - 5.11 MIL/uL   Hemoglobin 14.8  12.0 - 15.0 g/dL   HCT 16.1  09.6 - 04.5 %   MCV 84.0  78.0 - 100.0 fL   MCH 28.2  26.0 - 34.0 pg   MCHC 33.6  30.0 - 36.0 g/dL   RDW 40.9  81.1 - 91.4 %   Platelets 297  150 - 400 K/uL  COMPREHENSIVE METABOLIC PANEL     Status: Abnormal   Collection Time    06/01/12  1:14 PM      Result Value Range   Sodium 140  135 - 145 mEq/L   Potassium 2.7 (*) 3.5 - 5.1 mEq/L   Comment: CRITICAL RESULT CALLED TO, READ BACK BY AND VERIFIED WITH:     BAYNES,E. RN 1422 06/01/12 BARFIELD,T   Chloride 98  96 - 112 mEq/L   CO2 23  19 - 32 mEq/L   Glucose, Bld 183 (*) 70 - 99 mg/dL   BUN 25 (*) 6 - 23 mg/dL   Creatinine, Ser 7.82  0.50 - 1.10 mg/dL   Calcium 95.6 (*) 8.4 - 10.5 mg/dL   Total Protein 8.4 (*) 6.0 - 8.3 g/dL   Albumin 4.3  3.5 - 5.2 g/dL   AST 40 (*) 0 - 37 U/L   ALT 27  0 - 35 U/L   Alkaline Phosphatase 102  39 - 117 U/L   Total Bilirubin 0.4  0.3 - 1.2 mg/dL  GFR calc non Af Amer 79 (*) >90 mL/min   GFR calc Af Amer >90  >90 mL/min   Comment:            The eGFR has been calculated     using the CKD EPI equation.     This calculation has not been     validated in all clinical     situations.     eGFR's persistently     <90 mL/min signify     possible Chronic Kidney Disease.  PROTIME-INR     Status: None   Collection Time    06/01/12  2:20 PM      Result Value Range   Prothrombin Time 12.9  11.6 - 15.2 seconds   INR 0.98  0.00 - 1.49  APTT     Status: None   Collection Time    06/01/12  2:20 PM      Result Value Range   aPTT 27  24 - 37 seconds  LACTIC ACID, PLASMA     Status: Abnormal   Collection Time    06/01/12  2:30 PM      Result Value Range   Lactic Acid, Venous 5.6 (*) 0.5 - 2.2 mmol/L  URINALYSIS, ROUTINE W REFLEX MICROSCOPIC     Status: Abnormal   Collection Time    06/01/12  3:51 PM      Result Value Range   Color, Urine RED (*) YELLOW   Comment: BIOCHEMICALS MAY BE AFFECTED BY COLOR   APPearance CLEAR  CLEAR   Specific Gravity, Urine 1.010  1.005 - 1.030   pH 7.0  5.0 - 8.0   Glucose, UA NEGATIVE  NEGATIVE mg/dL   Hgb urine dipstick NEGATIVE  NEGATIVE   Bilirubin Urine SMALL  (*) NEGATIVE   Ketones, ur NEGATIVE  NEGATIVE mg/dL   Protein, ur NEGATIVE  NEGATIVE mg/dL   Urobilinogen, UA 1.0  0.0 - 1.0 mg/dL   Nitrite NEGATIVE  NEGATIVE   Leukocytes, UA TRACE (*) NEGATIVE  URINE MICROSCOPIC-ADD ON     Status: Abnormal   Collection Time    06/01/12  3:51 PM      Result Value Range   Squamous Epithelial / LPF FEW (*) RARE   WBC, UA 0-2  <3 WBC/hpf   Bacteria, UA FEW (*) RARE   Urine-Other MUCOUS PRESENT    GLUCOSE, CAPILLARY     Status: Abnormal   Collection Time    06/01/12  8:15 PM      Result Value Range   Glucose-Capillary 211 (*) 70 - 99 mg/dL    Ct Abdomen Pelvis Wo Contrast  06/01/2012  *RADIOLOGY REPORT*  Clinical Data: Abdominal cramping and nausea  CT ABDOMEN AND PELVIS WITHOUT CONTRAST  Technique:  Multidetector CT imaging of the abdomen and pelvis was performed following the standard protocol without intravenous contrast.  Comparison: None.  Findings: Mild moderate cardiomegaly noted.  Curvilinear and patchy subpleural right lower lobe and lingular airspace opacities noted, likely atelectasis.  No pleural effusion.  Hepatic granulomas are noted.  Fluid density lobulated hepatic lesions, largest in the right hepatic lobe measuring 3.9 x 3.5 cm, compatible with cysts.  A small amount of contrast reflux is noted in the distal esophagus.  Unenhanced spleen, pancreas, left kidney are unremarkable.  Fluid density probable cyst at the inferior mid aspect of the right kidney measures 1.7 cm image 32.  Mild prominence of both adrenal glands is most compatible with hyperplasia.  There is long segmental left  colonic wall thickening and surrounding stranding with trace fluid tracking along the left pericolic gutter.  No surrounding rim enhancing fluid collection is identified to suggest abscess.  Diverticuli are noted.  Small bowel is unremarkable.  The appendix is normal.  Right ovary is normal. Left ovary is not well visualized but no left adnexal mass is seen. The  bladder is decompressed but unremarkable.  Lumbar spine degenerative changes noted.  No lytic or sclerotic osseous lesion.  IMPRESSION: Long segmental contiguous left colonic wall thickening most compatible with diverticulitis.  If the patient has not had recent colonoscopy, this would be recommended when symptoms resolve in order to exclude neoplasm.  No complicating features such as abscess or free air.   Original Report Authenticated By: Christiana Pellant, M.D.     Review of Systems  Constitutional: Positive for malaise/fatigue. Negative for fever and chills.  Respiratory: Negative for cough.   Cardiovascular: Negative for chest pain.  Gastrointestinal: Positive for nausea, vomiting, abdominal pain and constipation. Negative for blood in stool.  Genitourinary: Negative for dysuria and frequency.   Blood pressure 205/94, pulse 77, temperature 98.8 F (37.1 C), temperature source Oral, resp. rate 10, SpO2 96.00%. Physical Exam  Vitals reviewed. Constitutional: She appears well-developed and well-nourished.  Eyes: No scleral icterus.  Cardiovascular: Regular rhythm.  Tachycardia present.   Respiratory: Effort normal and breath sounds normal. She has no wheezes. She has no rales.  GI: Bowel sounds are normal. She exhibits distension (mild). There is tenderness (mild diffuse tenderness most prominent in llq, no peritoneal signs) in the left lower quadrant. No hernia.  Lymphadenopathy:    She has no cervical adenopathy.    Assessment/Plan: Likely diveriticulitis Clinically sounds like diverticular disease and this looks like on ct. Wbc elevated.  I think lactate likely elevated due to n/v/d.  I don't think she has peritonitis on her exam and I think clinically does not need an operation right now. I think fluid resuscitation, abx as have been ordered and monitoring are best step.  Repeat labs in am and she should fairly quickly respond to volume.  She may end up needing surgery at some point but  reasonable to do conservative therapy now.  NPO.  She will need assessment to ensure this is not tumor at some point also.  Pegah Segel 06/01/2012, 9:34 PM

## 2012-06-02 DIAGNOSIS — I498 Other specified cardiac arrhythmias: Secondary | ICD-10-CM

## 2012-06-02 DIAGNOSIS — R030 Elevated blood-pressure reading, without diagnosis of hypertension: Secondary | ICD-10-CM

## 2012-06-02 DIAGNOSIS — D72829 Elevated white blood cell count, unspecified: Secondary | ICD-10-CM

## 2012-06-02 DIAGNOSIS — R000 Tachycardia, unspecified: Secondary | ICD-10-CM

## 2012-06-02 DIAGNOSIS — E876 Hypokalemia: Secondary | ICD-10-CM

## 2012-06-02 DIAGNOSIS — E871 Hypo-osmolality and hyponatremia: Secondary | ICD-10-CM

## 2012-06-02 LAB — COMPREHENSIVE METABOLIC PANEL
Alkaline Phosphatase: 75 U/L (ref 39–117)
BUN: 26 mg/dL — ABNORMAL HIGH (ref 6–23)
Creatinine, Ser: 0.74 mg/dL (ref 0.50–1.10)
GFR calc Af Amer: >90 mL/min (ref >90)
Glucose, Bld: 154 mg/dL — ABNORMAL HIGH (ref 70–99)
Potassium: 3.9 mEq/L (ref 3.5–5.1)
Total Bilirubin: 0.3 mg/dL (ref 0.3–1.2)
Total Protein: 6.4 g/dL (ref 6.0–8.3)

## 2012-06-02 LAB — GLUCOSE, CAPILLARY
Glucose-Capillary: 144 mg/dL — ABNORMAL HIGH (ref 70–99)
Glucose-Capillary: 142 mg/dL — ABNORMAL HIGH (ref 70–99)
Glucose-Capillary: 115 mg/dL — ABNORMAL HIGH (ref 70–99)

## 2012-06-02 LAB — CBC
RBC: 5.15 MIL/uL — ABNORMAL HIGH (ref 3.87–5.11)
RDW: 15.6 % — ABNORMAL HIGH (ref 11.5–15.5)
WBC: 25.3 10*3/uL — ABNORMAL HIGH (ref 4.0–10.5)

## 2012-06-02 LAB — MRSA PCR SCREENING: MRSA by PCR: NEGATIVE

## 2012-06-02 MED ORDER — HYDRALAZINE HCL 20 MG/ML IJ SOLN: 10.0000 mg | INTRAMUSCULAR | Status: DC | PRN

## 2012-06-02 NOTE — Progress Notes (Signed)
Patient ID: Haley Wilcox, female   DOB: [birth year], 79 y.o.   MRN: [id number]    Subjective: Feels a little better today, denies n/v, pain is mostly in lower poles of abd, denies fever  Objective: Vital signs in last 24 hours: Temp:  [98.6 F (37 C)-99.5 F (37.5 C)] 98.6 F (37 C) (03/13 0843) Pulse Rate:  [77-125] 104 (03/13 0800) Resp:  [10-26] 17 (03/13 0800) BP: (124-205)/(64-96) 145/79 mmHg (03/13 0800) SpO2:  [92 %-100 %] 100 % (03/13 0800) Weight:  [174 lb 2.6 oz (79 kg)] 174 lb 2.6 oz (79 kg) (03/12 2047) Last BM Date: 06/01/12  Intake/Output from previous day: 03/12 0701 - 03/13 0700 In: 1200 [I.V.:1100; IV Piggyback:100] Out: 150 [Urine:150] Intake/Output this shift: Total I/O In: 400 [I.V.:200; IV Piggyback:200] Out: -   PE: Abd: tender esp on right and lower, no peritonitis, +BS General: awake, alert, nad  Lab Results:   Recent Labs  06/01/12 1314 06/02/12 0325  WBC 25.1* 25.3*  HGB 14.8 15.0  HCT 44.1 42.1  PLT 297 245   BMET  Recent Labs  06/01/12 1314 06/02/12 0325  NA 140 133*  K 2.7* 3.9  CL 98 99  CO2 23 23  GLUCOSE 183* 154*  BUN 25* 26*  CREATININE 0.75 0.74  CALCIUM 12.6* 9.4   PT/INR  Recent Labs  06/01/12 1420  LABPROT 12.9  INR 0.98   CMP     Component Value Date/Time   NA 133* 06/02/2012 0325   K 3.9 06/02/2012 0325   CL 99 06/02/2012 0325   CO2 23 06/02/2012 0325   GLUCOSE 154* 06/02/2012 0325   BUN 26* 06/02/2012 0325   CREATININE 0.74 06/02/2012 0325   CALCIUM 9.4 06/02/2012 0325   PROT 6.4 06/02/2012 0325   ALBUMIN 3.0* 06/02/2012 0325   AST 27 06/02/2012 0325   ALT 18 06/02/2012 0325   ALKPHOS 75 06/02/2012 0325   BILITOT 0.3 06/02/2012 0325   GFRNONAA 79* 06/02/2012 0325   GFRAA >90 06/02/2012 0325   Lipase  No results found for this basename: lipase       Studies/Results: Ct Abdomen Pelvis Wo Contrast  06/01/2012  *RADIOLOGY REPORT*  Clinical Data: Abdominal cramping and nausea  CT ABDOMEN AND PELVIS  WITHOUT CONTRAST  Technique:  Multidetector CT imaging of the abdomen and pelvis was performed following the standard protocol without intravenous contrast.  Comparison: None.  Findings: Mild moderate cardiomegaly noted.  Curvilinear and patchy subpleural right lower lobe and lingular airspace opacities noted, likely atelectasis.  No pleural effusion.  Hepatic granulomas are noted.  Fluid density lobulated hepatic lesions, largest in the right hepatic lobe measuring 3.9 x 3.5 cm, compatible with cysts.  A small amount of contrast reflux is noted in the distal esophagus.  Unenhanced spleen, pancreas, left kidney are unremarkable.  Fluid density probable cyst at the inferior mid aspect of the right kidney measures 1.7 cm image 32.  Mild prominence of both adrenal glands is most compatible with hyperplasia.  There is long segmental left colonic wall thickening and surrounding stranding with trace fluid tracking along the left pericolic gutter.  No surrounding rim enhancing fluid collection is identified to suggest abscess.  Diverticuli are noted.  Small bowel is unremarkable.  The appendix is normal.  Right ovary is normal. Left ovary is not well visualized but no left adnexal mass is seen. The bladder is decompressed but unremarkable.  Lumbar spine degenerative changes noted.  No lytic or sclerotic osseous lesion.  IMPRESSION: Long segmental contiguous left colonic wall thickening most compatible with diverticulitis.  If the patient has not had recent colonoscopy, this would be recommended when symptoms resolve in order to exclude neoplasm.  No complicating features such as abscess or free air.   Original Report Authenticated By: Christiana Pellant, M.D.     Anti-infectives: Anti-infectives   Start     Dose/Rate Route Frequency Ordered Stop   06/02/12 0800  ciprofloxacin (CIPRO) IVPB 400 mg     400 mg 200 mL/hr over 60 Minutes Intravenous Every 12 hours 06/01/12 2049     06/02/12 0200  metroNIDAZOLE (FLAGYL) IVPB  500 mg     500 mg 100 mL/hr over 60 Minutes Intravenous Every 8 hours 06/01/12 2049     06/01/12 1730  ciprofloxacin (CIPRO) IVPB 400 mg  Status:  Discontinued     400 mg 200 mL/hr over 60 Minutes Intravenous  Once 06/01/12 1726 06/01/12 2026   06/01/12 1730  metroNIDAZOLE (FLAGYL) IVPB 500 mg  Status:  Discontinued     500 mg 100 mL/hr over 60 Minutes Intravenous  Once 06/01/12 1726 06/01/12 1926       Assessment/Plan 1. Sigmoid diverticulitis: a little better this am, keep on bowel rest until pain resolved then can advance diet,  Continue cipro/flagyl,  LOS: 1 day    WHITE, ELIZABETH 06/02/2012

## 2012-06-02 NOTE — Progress Notes (Addendum)
[facility] PROGRESS NOTE  Haley Wilcox [id number]:[id number] DOB: Mar 13, 1932 DOA: 06/01/2012 PCP: Carrie Mew, MD  Assessment/Plan  Acute Diverticulitis: - Cont IV Ciprofloxacin and Flagyl  - Cont NPO, IVF, supportive care  - lactic acid trending down but WBC stable.   - h/o colonoscopy per Dr.Stark in 2008, which was reportedly normal except for polyps - appreciate surgery recommendations -  Continue narcotics for pain control -  Add incentive spirometer to prevent splinting.     2. DM: fingersticks trending down, likely elevated due to stress response -  Cont SSI and titrate prn -  hold metformin   3. HTN: elevated BP likely due to pain -  Control pain -  Add hydralazine prn  Tachycardia, sinus, no significant red alarms on tele.  May be due to SIRS/sepsis versus pain -  Continue telemetry -  Control pain -  Continue abx  4. Gout: hold allopurinol - monitor for flare  Weakness: -  Falls precautions -  Iron panel -  Will order PT/OT consults when patient feeling better  Diet:  NPO Access:  PIV  IVF:  NS at 196ml/h  Proph:  lovenox  Code Status: full Family Communication: spoke with pt alone Disposition Plan:  Transfer to telemetry   Consultants:  General surgery  Procedures:  CT ab/pel  Antibiotics:  cipro 3/12 >>  Flagyl 3/12 >>   HPI/Subjective:  Denies fevers, chills, SOB, CP.  Has some abdominal pain when taking a deep breath.  Diarrhea resolving, however has pain when passing gas currently.  Denies N/V.    Objective: Filed Vitals:   06/01/12 2047 06/02/12 0000 06/02/12 0036 06/02/12 0400  BP: 205/94  159/64 171/69  Pulse: 77  106 105  Temp: 98.8 F (37.1 C) 98.7 F (37.1 C)  98.8 F (37.1 C)  TempSrc: Oral Oral  Oral  Resp: 10     Height: 5\' 2"  (1.575 m)     Weight: 79 kg (174 lb 2.6 oz)     SpO2: 96%  100% 96%    Intake/Output Summary (Last 24 hours) at 06/02/12 0756 Last data filed at 06/02/12 0700  Gross per 24  hour  Intake   1200 ml  Output    150 ml  Net   1050 ml   Filed Weights   06/01/12 2047  Weight: 79 kg (174 lb 2.6 oz)    Exam:   General:  AAF, mild perioral pallor, ill appearing, No acute distress  HEENT:  NCAT,  MMM  Cardiovascular:  Mild tachycardia, RR, nl S1, S2 no mrg, 2+ pulses, warm extremities  Respiratory:  CTAB, no increased WOB  Abdomen:  Hypoactive BS, soft, Diffusely TTP but worse in the LUQ and LLQ without rebound or guarding.  ND  MSK:   Normal tone and bulk, no LEE  Neuro:  Grossly intact  Data Reviewed: Basic Metabolic Panel:  Recent Labs Lab 06/01/12 1314 06/02/12 0325  NA 140 133*  K 2.7* 3.9  CL 98 99  CO2 23 23  GLUCOSE 183* 154*  BUN 25* 26*  CREATININE 0.75 0.74  CALCIUM 12.6* 9.4   Liver Function Tests:  Recent Labs Lab 06/01/12 1314 06/02/12 0325  AST 40* 27  ALT 27 18  ALKPHOS 102 75  BILITOT 0.4 0.3  PROT 8.4* 6.4  ALBUMIN 4.3 3.0*   No results found for this basename: LIPASE, AMYLASE,  in the last 168 hours No results found for this basename: AMMONIA,  in the last 168 hours  CBC:  Recent Labs Lab 06/01/12 1314 06/02/12 0325  WBC 25.1* 25.3*  HGB 14.8 15.0  HCT 44.1 42.1  MCV 84.0 81.7  PLT 297 245   Cardiac Enzymes: No results found for this basename: CKTOTAL, CKMB, CKMBINDEX, TROPONINI,  in the last 168 hours BNP (last 3 results) No results found for this basename: PROBNP,  in the last 8760 hours CBG:  Recent Labs Lab 06/01/12 2015 06/01/12 2336 06/02/12 0348  GLUCAP 211* 169* 144*    No results found for this or any previous visit (from the past 240 hour(s)).   Studies: Ct Abdomen Pelvis Wo Contrast  06/01/2012  *RADIOLOGY REPORT*  Clinical Data: Abdominal cramping and nausea  CT ABDOMEN AND PELVIS WITHOUT CONTRAST  Technique:  Multidetector CT imaging of the abdomen and pelvis was performed following the standard protocol without intravenous contrast.  Comparison: None.  Findings: Mild moderate  cardiomegaly noted.  Curvilinear and patchy subpleural right lower lobe and lingular airspace opacities noted, likely atelectasis.  No pleural effusion.  Hepatic granulomas are noted.  Fluid density lobulated hepatic lesions, largest in the right hepatic lobe measuring 3.9 x 3.5 cm, compatible with cysts.  A small amount of contrast reflux is noted in the distal esophagus.  Unenhanced spleen, pancreas, left kidney are unremarkable.  Fluid density probable cyst at the inferior mid aspect of the right kidney measures 1.7 cm image 32.  Mild prominence of both adrenal glands is most compatible with hyperplasia.  There is long segmental left colonic wall thickening and surrounding stranding with trace fluid tracking along the left pericolic gutter.  No surrounding rim enhancing fluid collection is identified to suggest abscess.  Diverticuli are noted.  Small bowel is unremarkable.  The appendix is normal.  Right ovary is normal. Left ovary is not well visualized but no left adnexal mass is seen. The bladder is decompressed but unremarkable.  Lumbar spine degenerative changes noted.  No lytic or sclerotic osseous lesion.  IMPRESSION: Long segmental contiguous left colonic wall thickening most compatible with diverticulitis.  If the patient has not had recent colonoscopy, this would be recommended when symptoms resolve in order to exclude neoplasm.  No complicating features such as abscess or free air.   Original Report Authenticated By: Christiana Pellant, M.D.     Scheduled Meds: . ciprofloxacin  400 mg Intravenous Q12H  . enoxaparin (LOVENOX) injection  40 mg Subcutaneous Q2000  . insulin aspart  0-9 Units Subcutaneous Q4H  . metronidazole  500 mg Intravenous Q8H   Continuous Infusions: . sodium chloride 100 mL/hr at 06/01/12 2144    Active Problems:   DIABETES MELLITUS, TYPE II   HYPERTENSION   Diverticulitis of sigmoid colon    Time spent: 30 min    SHORT, [facility]  [facility] Pager  [phone]. If 7PM-7AM, please contact night-coverage at [email or web address], password [facility] 06/02/2012, 7:56 AM  LOS: 1 day

## 2012-06-02 NOTE — Plan of Care (Signed)
Problem: Phase I Progression Outcomes Goal: OOB as tolerated unless otherwise ordered Outcome: Progressing In bedside chair for an hour.  Problem: Phase II Progression Outcomes Goal: Progress activity as tolerated unless otherwise ordered Outcome: Not Progressing Severe weakness and tiredness.

## 2012-06-02 NOTE — Progress Notes (Signed)
Patient has been using  Incentive spirometry often.  Fatigue is improving.  Patient is alert and oriented.  No stools but passing flatus. Abdomen tender but patient refused pain medication.  Daughter at the bedside. Patient transferring to telemetry.

## 2012-06-03 DIAGNOSIS — D509 Iron deficiency anemia, unspecified: Secondary | ICD-10-CM

## 2012-06-03 LAB — GLUCOSE, CAPILLARY
Glucose-Capillary: 106 mg/dL — ABNORMAL HIGH (ref 70–99)
Glucose-Capillary: 98 mg/dL (ref 70–99)

## 2012-06-03 LAB — CBC
Platelets: 207 10*3/uL (ref 150–400)
RBC: 4.26 MIL/uL (ref 3.87–5.11)
RDW: 15.9 % — ABNORMAL HIGH (ref 11.5–15.5)
WBC: 17.4 10*3/uL — ABNORMAL HIGH (ref 4.0–10.5)

## 2012-06-03 LAB — BASIC METABOLIC PANEL
Calcium: 8.2 mg/dL — ABNORMAL LOW (ref 8.4–10.5)
Creatinine, Ser: 0.6 mg/dL (ref 0.50–1.10)
GFR calc Af Amer: >90 mL/min (ref >90)
GFR calc non Af Amer: 85 mL/min — ABNORMAL LOW (ref >90)
Sodium: 134 mEq/L — ABNORMAL LOW (ref 135–145)

## 2012-06-03 LAB — IRON AND TIBC
Saturation Ratios: 8 % — ABNORMAL LOW (ref 20–55)
TIBC: 192 ug/dL — ABNORMAL LOW (ref 250–470)
UIBC: 176 ug/dL (ref 125–400)

## 2012-06-03 LAB — TRANSFERRIN: Transferrin: 140 mg/dL — ABNORMAL LOW (ref 200–360)

## 2012-06-03 LAB — FERRITIN: Ferritin: 114 ng/mL (ref 10–291)

## 2012-06-03 MED ORDER — METOPROLOL TARTRATE 1 MG/ML IV SOLN
2.5000 mg | Freq: Four times a day (QID) | INTRAVENOUS | Status: DC
  Administered 2012-06-03 – 2012-06-04 (×4): 2.5 mg via INTRAVENOUS
  Filled 2012-06-03 (×8): qty 5

## 2012-06-03 NOTE — Evaluation (Signed)
Physical Therapy Evaluation Patient Details Name: Haley Wilcox MRN: [id number] DOB: 1931-05-28 Today's Date: 06/03/2012 Time: 4098-1191 PT Time Calculation (min): 12 min  PT Assessment / Plan / Recommendation Clinical Impression  Pt admitted for Acute Diverticulitis.  Pt would benefit from acute PT services in order to improve independence with transfers and ambulation during hospitalization to prepare for d/c home with daughter to assist as needed.  Pt reports no stairs at home due to hx of gout and declines HHPT as she will likely progress back to her baseline.    PT Assessment  Patient needs continued PT services    Follow Up Recommendations  No PT follow up    Does the patient have the potential to tolerate intense rehabilitation      Barriers to Discharge        Equipment Recommendations  None recommended by PT    Recommendations for Other Services     Frequency Min 3X/week    Precautions / Restrictions     Pertinent Vitals/Pain n/a      Mobility  Bed Mobility Bed Mobility: Supine to Sit Supine to Sit: 4: Min assist;HOB elevated Details for Bed Mobility Assistance: daughter assisted pt's trunk Transfers Transfers: Stand to Sit;Sit to Stand Sit to Stand: 4: Min guard;With upper extremity assist;From bed Stand to Sit: 4: Min guard;With upper extremity assist;To chair/3-in-1 Details for Transfer Assistance: verbal cues for safe technique Ambulation/Gait Ambulation/Gait Assistance: 4: Min guard Ambulation Distance (Feet): 200 Feet Assistive device: Other (Comment) (IV pole) Ambulation/Gait Assistance Details: pt required IV pole for some support, pt agrees to use cane for mobility at home, slight unsteadiness initally but improved  (pt reports her first time ambulating since admission) Gait Pattern: Step-to pattern;Trunk flexed Gait velocity: decreased    Exercises     PT Diagnosis: Difficulty walking  PT Problem List: Decreased strength;Decreased activity  tolerance;Decreased mobility PT Treatment Interventions: DME instruction;Gait training;Functional mobility training;Therapeutic activities;Therapeutic exercise;Patient/family education   PT Goals Acute Rehab PT Goals PT Goal Formulation: With patient Time For Goal Achievement: 06/10/12 Potential to Achieve Goals: Good Pt will go Sit to Stand: with modified independence PT Goal: Sit to Stand - Progress: Goal set today Pt will go Stand to Sit: with modified independence PT Goal: Stand to Sit - Progress: Goal set today Pt will Ambulate: >150 feet;with modified independence;with least restrictive assistive device PT Goal: Ambulate - Progress: Goal set today Pt will Perform Home Exercise Program: with supervision, verbal cues required/provided PT Goal: Perform Home Exercise Program - Progress: Goal set today  Visit Information  Last PT Received On: 06/03/12 Assistance Needed: +1    Subjective Data  Subjective: I've been in bed since Tuesday. Patient Stated Goal: home   Prior Functioning  Home Living Lives With: Alone Available Help at Discharge: Family;Available PRN/intermittently Type of Home: House Home Access: Level entry Home Layout: One level Home Adaptive Equipment: Straight cane;Crutches Prior Function Level of Independence: Independent Communication Communication: No difficulties    Cognition  Cognition Overall Cognitive Status: Appears within functional limits for tasks assessed/performed Arousal/Alertness: Awake/alert Orientation Level: Appears intact for tasks assessed Behavior During Session: [facility] for tasks performed    Extremity/Trunk Assessment Right Lower Extremity Assessment RLE ROM/Strength/Tone: [facility] for tasks assessed Left Lower Extremity Assessment LLE ROM/Strength/Tone: [facility] for tasks assessed   Balance    End of Session PT - End of Session Equipment Utilized During Treatment: Gait belt Activity Tolerance: Patient tolerated treatment well Patient  left: in chair;with call bell/phone within reach;with  family/visitor present  GP     LEMYRE,KATHrine E 06/03/2012, 11:16 AM Zenovia Jarred, PT, DPT 06/03/2012 Pager: [phone]

## 2012-06-03 NOTE — Progress Notes (Addendum)
[facility] PROGRESS NOTE  Haley Wilcox ZOX:[id number] DOB: Mar 06, 1932 DOA: 06/01/2012 PCP: Carrie Mew, MD  Assessment/Plan  Acute Diverticulitis:  Improving pain.  Diarrhea with streak of blood this morning. - Cont IV Ciprofloxacin and Flagyl  - Cont NPO, IVF, supportive care  - lactic acid and WBC trending down - appreciate surgery recommendations -  Continue narcotics for pain control -  Cont incentive spirometer   2. DM: fingersticks trending down, likely elevated due to stress response -  Cont SSI and titrate prn -  hold metformin   3. HTN: elevated BP likely due to pain and held BP meds -  Control pain -  Add metoprolol IV scheduled -  Continue hydralazine prn  Tachycardia, sinus, no significant red alarms on tele.  May be due to SIRS/sepsis versus pain -  Continue telemetry -  Control pain -  Continue abx  4. Gout: hold allopurinol - monitor for flare  Weakness: -  Falls precautions -  Iron panel pending -  PT/OT  Iron deficiency anemia: -  Start iron when patient tolerating full diet.    Diet:  NPO except ice chips Access:  PIV  IVF:  NS at 161ml/h  Proph:  lovenox  Code Status: full Family Communication: spoke with pt alone Disposition Plan:  Transfer to telemetry   Consultants:  General surgery  Procedures:  CT ab/pel  Antibiotics:  cipro 3/12 >>  Flagyl 3/12 >>   HPI/Subjective:  Denies fevers, chills, SOB, CP.  Improved abdominal pain. Diarrhea with streak of blood and passing gas currently.  Denies N/V.    Objective: Filed Vitals:   06/02/12 1430 06/02/12 1503 06/02/12 2130 06/03/12 0501  BP: 169/63 162/71 178/71 162/89  Pulse:   113 111  Temp:  98.8 F (37.1 C) 99.4 F (37.4 C) 98.5 F (36.9 C)  TempSrc:  Oral Oral Oral  Resp:  20 18 18   Height:      Weight:      SpO2:  96% 96% 98%    Intake/Output Summary (Last 24 hours) at 06/03/12 0937 Last data filed at 06/03/12 0745  Gross per 24 hour  Intake    2400 ml  Output   1725 ml  Net    675 ml   Filed Weights   06/01/12 2047  Weight: 79 kg (174 lb 2.6 oz)    Exam:   General:  AAF, No acute distress  HEENT:  NCAT,  MMM  Cardiovascular:  Mild tachycardia, RR, nl S1, S2 no mrg, 2+ pulses, warm extremities  Respiratory:  CTAB, no increased WOB  Abdomen:  Hypoactive BS, soft, Mild TTP LLQ without rebound or guarding.  ND  MSK:   Normal tone and bulk, no LEE  Neuro:  Grossly intact  Data Reviewed: Basic Metabolic Panel:  Recent Labs Lab 06/01/12 1314 06/02/12 0325 06/03/12 0443  NA 140 133* 134*  K 2.7* 3.9 3.7  CL 98 99 104  CO2 23 23 23   GLUCOSE 183* 154* 99  BUN 25* 26* 16  CREATININE 0.75 0.74 0.60  CALCIUM 12.6* 9.4 8.2*   Liver Function Tests:  Recent Labs Lab 06/01/12 1314 06/02/12 0325  AST 40* 27  ALT 27 18  ALKPHOS 102 75  BILITOT 0.4 0.3  PROT 8.4* 6.4  ALBUMIN 4.3 3.0*   No results found for this basename: LIPASE, AMYLASE,  in the last 168 hours No results found for this basename: AMMONIA,  in the last 168 hours CBC:  Recent Labs Lab  06/01/12 1314 06/02/12 0325 06/03/12 0443  WBC 25.1* 25.3* 17.4*  HGB 14.8 15.0 12.0  HCT 44.1 42.1 35.6*  MCV 84.0 81.7 83.6  PLT 297 245 207   Cardiac Enzymes: No results found for this basename: CKTOTAL, CKMB, CKMBINDEX, TROPONINI,  in the last 168 hours BNP (last 3 results) No results found for this basename: PROBNP,  in the last 8760 hours CBG:  Recent Labs Lab 06/02/12 1709 06/02/12 2038 06/03/12 0003 06/03/12 0459 06/03/12 0806  GLUCAP 99 106* 107* 91 92    Recent Results (from the past 240 hour(s))  MRSA PCR SCREENING     Status: None   Collection Time    06/02/12 12:34 AM      Result Value Range Status   MRSA by PCR NEGATIVE  NEGATIVE Final   Comment:            The GeneXpert MRSA Assay (FDA     approved for NASAL specimens     only), is one component of a     comprehensive MRSA colonization     surveillance program. It is  not     intended to diagnose MRSA     infection nor to guide or     monitor treatment for     MRSA infections.     Studies: Ct Abdomen Pelvis Wo Contrast  06/01/2012  *RADIOLOGY REPORT*  Clinical Data: Abdominal cramping and nausea  CT ABDOMEN AND PELVIS WITHOUT CONTRAST  Technique:  Multidetector CT imaging of the abdomen and pelvis was performed following the standard protocol without intravenous contrast.  Comparison: None.  Findings: Mild moderate cardiomegaly noted.  Curvilinear and patchy subpleural right lower lobe and lingular airspace opacities noted, likely atelectasis.  No pleural effusion.  Hepatic granulomas are noted.  Fluid density lobulated hepatic lesions, largest in the right hepatic lobe measuring 3.9 x 3.5 cm, compatible with cysts.  A small amount of contrast reflux is noted in the distal esophagus.  Unenhanced spleen, pancreas, left kidney are unremarkable.  Fluid density probable cyst at the inferior mid aspect of the right kidney measures 1.7 cm image 32.  Mild prominence of both adrenal glands is most compatible with hyperplasia.  There is long segmental left colonic wall thickening and surrounding stranding with trace fluid tracking along the left pericolic gutter.  No surrounding rim enhancing fluid collection is identified to suggest abscess.  Diverticuli are noted.  Small bowel is unremarkable.  The appendix is normal.  Right ovary is normal. Left ovary is not well visualized but no left adnexal mass is seen. The bladder is decompressed but unremarkable.  Lumbar spine degenerative changes noted.  No lytic or sclerotic osseous lesion.  IMPRESSION: Long segmental contiguous left colonic wall thickening most compatible with diverticulitis.  If the patient has not had recent colonoscopy, this would be recommended when symptoms resolve in order to exclude neoplasm.  No complicating features such as abscess or free air.   Original Report Authenticated By: Christiana Pellant, M.D.      Scheduled Meds: . ciprofloxacin  400 mg Intravenous Q12H  . enoxaparin (LOVENOX) injection  40 mg Subcutaneous Q2000  . insulin aspart  0-9 Units Subcutaneous Q4H  . metronidazole  500 mg Intravenous Q8H   Continuous Infusions: . sodium chloride 100 mL/hr at 06/03/12 0156    Principal Problem:   Diverticulitis of sigmoid colon Active Problems:   DIABETES MELLITUS, TYPE II   HYPERTENSION   Sinus tachycardia   Elevated blood pressure   Leukocytosis,  unspecified   Hyponatremia    Time spent: 30 min    SHORT, [facility]  [facility] Pager [phone]. If 7PM-7AM, please contact night-coverage at [email or web address], password [facility] 06/03/2012, 9:37 AM  LOS: 2 days

## 2012-06-03 NOTE — Progress Notes (Signed)
Provided support with pt and family while rounding.  Pt in good spirits.  Immediate and extended family at bedside providing appropriate support.    Will continue to follow.   Please page as needs arise.    Belva Crome

## 2012-06-03 NOTE — Progress Notes (Signed)
OT Screen Note  Patient Details Name: JEREE DELCID MRN: [id number] DOB: February 15, 1932   Cancelled Treatment:     Pt screened for OT.  She will return home with daughter's help and has all DME.  Pt/daughter do not feel they need OT.    SPENCER,MARYELLEN 06/03/2012, 3:43 PM Marica Otter, OTR/L [phone] 06/03/2012

## 2012-06-03 NOTE — Progress Notes (Signed)
Patient ID: Haley Wilcox, female   DOB: 12/22/1931, 79 y.o.   MRN: [id number]    Subjective: Feels much better today, denies n/v, pain is mostly in lower poles of abd and mostly resolved, denies fever  Objective: Vital signs in last 24 hours: Temp:  [98.5 F (36.9 C)-99.4 F (37.4 C)] 98.5 F (36.9 C) (03/14 0501) Pulse Rate:  [90-113] 111 (03/14 0501) Resp:  [18-21] 18 (03/14 0501) BP: (162-178)/(63-89) 162/89 mmHg (03/14 0501) SpO2:  [96 %-100 %] 98 % (03/14 0501) Last BM Date: 06/02/12  Intake/Output from previous day: 03/13 0701 - 03/14 0700 In: 2800 [I.V.:2300; IV Piggyback:500] Out: 1575 [Urine:1575] Intake/Output this shift: Total I/O In: -  Out: 150 [Urine:150]  PE: Abd: mildly tender esp on lower abd, no peritonitis, +BS General: awake, alert, nad  Lab Results:   Recent Labs  06/02/12 0325 06/03/12 0443  WBC 25.3* 17.4*  HGB 15.0 12.0  HCT 42.1 35.6*  PLT 245 207   BMET  Recent Labs  06/02/12 0325 06/03/12 0443  NA 133* 134*  K 3.9 3.7  CL 99 104  CO2 23 23  GLUCOSE 154* 99  BUN 26* 16  CREATININE 0.74 0.60  CALCIUM 9.4 8.2*   PT/INR  Recent Labs  06/01/12 1420  LABPROT 12.9  INR 0.98   CMP     Component Value Date/Time   NA 134* 06/03/2012 0443   K 3.7 06/03/2012 0443   CL 104 06/03/2012 0443   CO2 23 06/03/2012 0443   GLUCOSE 99 06/03/2012 0443   BUN 16 06/03/2012 0443   CREATININE 0.60 06/03/2012 0443   CALCIUM 8.2* 06/03/2012 0443   PROT 6.4 06/02/2012 0325   ALBUMIN 3.0* 06/02/2012 0325   AST 27 06/02/2012 0325   ALT 18 06/02/2012 0325   ALKPHOS 75 06/02/2012 0325   BILITOT 0.3 06/02/2012 0325   GFRNONAA 85* 06/03/2012 0443   GFRAA >90 06/03/2012 0443   Lipase  No results found for this basename: lipase       Studies/Results: Ct Abdomen Pelvis Wo Contrast  06/01/2012  *RADIOLOGY REPORT*  Clinical Data: Abdominal cramping and nausea  CT ABDOMEN AND PELVIS WITHOUT CONTRAST  Technique:  Multidetector CT imaging of the abdomen  and pelvis was performed following the standard protocol without intravenous contrast.  Comparison: None.  Findings: Mild moderate cardiomegaly noted.  Curvilinear and patchy subpleural right lower lobe and lingular airspace opacities noted, likely atelectasis.  No pleural effusion.  Hepatic granulomas are noted.  Fluid density lobulated hepatic lesions, largest in the right hepatic lobe measuring 3.9 x 3.5 cm, compatible with cysts.  A small amount of contrast reflux is noted in the distal esophagus.  Unenhanced spleen, pancreas, left kidney are unremarkable.  Fluid density probable cyst at the inferior mid aspect of the right kidney measures 1.7 cm image 32.  Mild prominence of both adrenal glands is most compatible with hyperplasia.  There is long segmental left colonic wall thickening and surrounding stranding with trace fluid tracking along the left pericolic gutter.  No surrounding rim enhancing fluid collection is identified to suggest abscess.  Diverticuli are noted.  Small bowel is unremarkable.  The appendix is normal.  Right ovary is normal. Left ovary is not well visualized but no left adnexal mass is seen. The bladder is decompressed but unremarkable.  Lumbar spine degenerative changes noted.  No lytic or sclerotic osseous lesion.  IMPRESSION: Long segmental contiguous left colonic wall thickening most compatible with diverticulitis.  If the patient  has not had recent colonoscopy, this would be recommended when symptoms resolve in order to exclude neoplasm.  No complicating features such as abscess or free air.   Original Report Authenticated By: Christiana Pellant, M.D.     Anti-infectives: Anti-infectives   Start     Dose/Rate Route Frequency Ordered Stop   06/02/12 0800  ciprofloxacin (CIPRO) IVPB 400 mg     400 mg 200 mL/hr over 60 Minutes Intravenous Every 12 hours 06/01/12 2049     06/02/12 0200  metroNIDAZOLE (FLAGYL) IVPB 500 mg     500 mg 100 mL/hr over 60 Minutes Intravenous Every 8  hours 06/01/12 2049     06/01/12 1730  ciprofloxacin (CIPRO) IVPB 400 mg  Status:  Discontinued     400 mg 200 mL/hr over 60 Minutes Intravenous  Once 06/01/12 1726 06/01/12 2026   06/01/12 1730  metroNIDAZOLE (FLAGYL) IVPB 500 mg  Status:  Discontinued     500 mg 100 mL/hr over 60 Minutes Intravenous  Once 06/01/12 1726 06/01/12 1926       Assessment/Plan 1. Sigmoid diverticulitis: improving but still with some pain, would wait until pain resolved to start diet which will likely be tomorrow, WBC trending down, ice chips ok, Continue cipro/flagyl,  LOS: 2 days    Dnasia Gauna 06/03/2012

## 2012-06-04 LAB — GLUCOSE, CAPILLARY
Glucose-Capillary: 77 mg/dL (ref 70–99)
Glucose-Capillary: 80 mg/dL (ref 70–99)
Glucose-Capillary: 84 mg/dL (ref 70–99)
Glucose-Capillary: 89 mg/dL (ref 70–99)
Glucose-Capillary: 98 mg/dL (ref 70–99)

## 2012-06-04 LAB — CBC
HCT: 34.4 % — ABNORMAL LOW (ref 36.0–46.0)
MCV: 82.7 fL (ref 78.0–100.0)
Platelets: 185 10*3/uL (ref 150–400)
RBC: 4.16 MIL/uL (ref 3.87–5.11)
WBC: 14.4 10*3/uL — ABNORMAL HIGH (ref 4.0–10.5)

## 2012-06-04 LAB — BASIC METABOLIC PANEL
CO2: 23 mEq/L (ref 19–32)
Chloride: 105 mEq/L (ref 96–112)
GFR calc Af Amer: >90 mL/min (ref >90)
Sodium: 136 mEq/L (ref 135–145)

## 2012-06-04 MED ORDER — POTASSIUM CHLORIDE IN NACL 20-0.9 MEQ/L-% IV SOLN
INTRAVENOUS | Status: DC
  Administered 2012-06-04 – 2012-06-06 (×4): via INTRAVENOUS
  Filled 2012-06-04 (×7): qty 1000

## 2012-06-04 MED ORDER — METOPROLOL TARTRATE 1 MG/ML IV SOLN
5.0000 mg | Freq: Four times a day (QID) | INTRAVENOUS | Status: DC
  Administered 2012-06-04 – 2012-06-05 (×4): 5 mg via INTRAVENOUS
  Filled 2012-06-04 (×8): qty 5

## 2012-06-04 MED ORDER — POTASSIUM CHLORIDE 10 MEQ/100ML IV SOLN
10.0000 meq | INTRAVENOUS | Status: AC
  Administered 2012-06-04 (×2): 10 meq via INTRAVENOUS
  Filled 2012-06-04 (×2): qty 100

## 2012-06-04 NOTE — Progress Notes (Signed)
[facility] PROGRESS NOTE  Haley Wilcox XBJ:[id number] DOB: [birth year] DOA: 06/01/2012 PCP: Carrie Mew, MD  Assessment/Plan  Acute Diverticulitis:  No pain on exam today.  Had soft formed BM this morning with possibly only a few flecks of blood.   - Cont IV Ciprofloxacin and Flagyl  - Start clears.   - lactic acid and WBC trending down - appreciate surgery recommendations -  Continue narcotics for pain control -  Cont incentive spirometer   2. DM: fingersticks wnl, likely elevated due to stress response -  Cont SSI and titrate prn -  hold metformin   3. HTN: elevated BP likely due to pain and held BP meds -  Increase metoprolol IV scheduled today and continue to monitor HR -  Okay to d/c telemetry, however, as no arrythmias. -  Continue hydralazine prn -  If okay with clears today, will restart oral BP medications tomorrow  Tachycardia, sinus, no significant red alarms on tele. Resolved.   -  D/c telemetry  4. Gout: hold allopurinol - monitor for flare  Weakness: -  Falls precautions -  Iron panel suggestive of iron deficiency or infection -  PT rec no follow up.  OT pendng  Iron deficiency anemia: -  Start iron when patient tolerating full diet.    Hypokalemia, likely due to lack of potassium in IVF -  Replete with IV and add potassium to IVF -  Start diet  Diet:  clears Access:  PIV  IVF:  NS with KCl at 135ml/h  Proph:  lovenox  Code Status: full Family Communication: spoke with pt alone Disposition Plan:  D/c telemetry.  Pending medically stable to home without services.     Consultants:  General surgery  Procedures:  CT ab/pel  Antibiotics:  cipro 3/12 >>  Flagyl 3/12 >>   HPI/Subjective:  Denies fevers, chills, SOB, CP.  No abdominal pain.  Formed BM this AM.   Denies N/V.    Objective: Filed Vitals:   06/03/12 1400 06/03/12 2004 06/04/12 0024 06/04/12 0452  BP: 171/72 167/83 156/81 153/67  Pulse: 93 91  88  Temp:  99.2 F (37.3 C) 99.2 F (37.3 C)  98.6 F (37 C)  TempSrc: Oral Oral  Oral  Resp: 18 20  20   Height:      Weight:      SpO2: 96% 96%  95%    Intake/Output Summary (Last 24 hours) at 06/04/12 0917 Last data filed at 06/04/12 0700  Gross per 24 hour  Intake   2500 ml  Output   2200 ml  Net    300 ml   Filed Weights   06/01/12 2047  Weight: 79 kg (174 lb 2.6 oz)    Exam:   General:  AAF, No acute distress  HEENT:  NCAT,  MMM  Cardiovascular:  RRR, nl S1, S2 no mrg, 2+ pulses, warm extremities  Respiratory:  CTAB, no increased WOB  Abdomen:  Hypoactive BS, soft, NT/ND  MSK:   Normal tone and bulk, no LEE  Neuro:  Grossly intact  Data Reviewed: Basic Metabolic Panel:  Recent Labs Lab 06/01/12 1314 06/02/12 0325 06/03/12 0443 06/04/12 0528  NA 140 133* 134* 136  K 2.7* 3.9 3.7 3.3*  CL 98 99 104 105  CO2 23 23 23 23   GLUCOSE 183* 154* 99 79  BUN 25* 26* 16 14  CREATININE 0.75 0.74 0.60 0.50  CALCIUM 12.6* 9.4 8.2* 8.2*   Liver Function Tests:  Recent Labs Lab  06/01/12 1314 06/02/12 0325  AST 40* 27  ALT 27 18  ALKPHOS 102 75  BILITOT 0.4 0.3  PROT 8.4* 6.4  ALBUMIN 4.3 3.0*   No results found for this basename: LIPASE, AMYLASE,  in the last 168 hours No results found for this basename: AMMONIA,  in the last 168 hours CBC:  Recent Labs Lab 06/01/12 1314 06/02/12 0325 06/03/12 0443 06/04/12 0528  WBC 25.1* 25.3* 17.4* 14.4*  HGB 14.8 15.0 12.0 11.6*  HCT 44.1 42.1 35.6* 34.4*  MCV 84.0 81.7 83.6 82.7  PLT 297 245 207 185   Cardiac Enzymes: No results found for this basename: CKTOTAL, CKMB, CKMBINDEX, TROPONINI,  in the last 168 hours BNP (last 3 results) No results found for this basename: PROBNP,  in the last 8760 hours CBG:  Recent Labs Lab 06/03/12 1603 06/03/12 2002 06/04/12 0021 06/04/12 0450 06/04/12 0802  GLUCAP 98 72 80 86 77    Recent Results (from the past 240 hour(s))  MRSA PCR SCREENING     Status: None    Collection Time    06/02/12 12:34 AM      Result Value Range Status   MRSA by PCR NEGATIVE  NEGATIVE Final   Comment:            The GeneXpert MRSA Assay (FDA     approved for NASAL specimens     only), is one component of a     comprehensive MRSA colonization     surveillance program. It is not     intended to diagnose MRSA     infection nor to guide or     monitor treatment for     MRSA infections.     Studies: No results found.  Scheduled Meds: . ciprofloxacin  400 mg Intravenous Q12H  . enoxaparin (LOVENOX) injection  40 mg Subcutaneous Q2000  . insulin aspart  0-9 Units Subcutaneous Q4H  . metoprolol  5 mg Intravenous Q6H  . metronidazole  500 mg Intravenous Q8H  . potassium chloride  10 mEq Intravenous Q1 Hr x 2   Continuous Infusions: . 0.9 % NaCl with KCl 20 mEq / L 100 mL/hr at 06/04/12 0900    Principal Problem:   Diverticulitis of sigmoid colon Active Problems:   DIABETES MELLITUS, TYPE II   HYPERTENSION   Sinus tachycardia   Elevated blood pressure   Leukocytosis, unspecified   Hyponatremia   Iron deficiency anemia    Time spent: 30 min    [name]  [facility] Pager [phone]. If 7PM-7AM, please contact night-coverage at [email or web address], password [facility] 06/04/2012, 9:17 AM  LOS: 3 days

## 2012-06-04 NOTE — Progress Notes (Signed)
  Subjective: No abd pain. No n/v. Tolerated liquids this am without abd pain. Small BM  Objective: Vital signs in last 24 hours: Temp:  [98.6 F (37 C)-99.2 F (37.3 C)] 98.6 F (37 C) (03/15 0452) Pulse Rate:  [88-93] 88 (03/15 0452) Resp:  [18-20] 20 (03/15 0452) BP: (153-171)/(67-83) 153/67 mmHg (03/15 0452) SpO2:  [95 %-96 %] 95 % (03/15 0452) Last BM Date: 06/04/12  Intake/Output from previous day: 03/14 0701 - 03/15 0700 In: 2500 [I.V.:2300; IV Piggyback:200] Out: 2350 [Urine:2350] Intake/Output this shift:    Alert, nad Obese, soft, nt, nd  Lab Results:   Recent Labs  06/03/12 0443 06/04/12 0528  WBC 17.4* 14.4*  HGB 12.0 11.6*  HCT 35.6* 34.4*  PLT 207 185   BMET  Recent Labs  06/03/12 0443 06/04/12 0528  NA 134* 136  K 3.7 3.3*  CL 104 105  CO2 23 23  GLUCOSE 99 79  BUN 16 14  CREATININE 0.60 0.50  CALCIUM 8.2* 8.2*   PT/INR  Recent Labs  06/01/12 1420  LABPROT 12.9  INR 0.98   ABG No results found for this basename: PHART, PCO2, PO2, HCO3,  in the last 72 hours  Studies/Results: No results found.  Anti-infectives: Anti-infectives   Start     Dose/Rate Route Frequency Ordered Stop   06/02/12 0800  ciprofloxacin (CIPRO) IVPB 400 mg     400 mg 200 mL/hr over 60 Minutes Intravenous Every 12 hours 06/01/12 2049     06/02/12 0200  metroNIDAZOLE (FLAGYL) IVPB 500 mg     500 mg 100 mL/hr over 60 Minutes Intravenous Every 8 hours 06/01/12 2049     06/01/12 1730  ciprofloxacin (CIPRO) IVPB 400 mg  Status:  Discontinued     400 mg 200 mL/hr over 60 Minutes Intravenous  Once 06/01/12 1726 06/01/12 2026   06/01/12 1730  metroNIDAZOLE (FLAGYL) IVPB 500 mg  Status:  Discontinued     500 mg 100 mL/hr over 60 Minutes Intravenous  Once 06/01/12 1726 06/01/12 1926      Assessment/Plan: Sigmoid/l colon diverticulitis  Clears today Cont IV abx today If does well can transition to Oral abx in am with diet progression  Mary Sella. Andrey Campanile,  MD, FACS General, Bariatric, & Minimally Invasive Surgery [facility], [place]   LOS: 3 days    Atilano Ina 06/04/2012

## 2012-06-04 NOTE — Progress Notes (Signed)
Patient ambulated in the hall today.  Tolerated well.

## 2012-06-04 NOTE — Progress Notes (Signed)
Order to [employer] telemetry.  Central monitoring notified.

## 2012-06-05 LAB — CBC
Platelets: 195 10*3/uL (ref 150–400)
RBC: 3.97 MIL/uL (ref 3.87–5.11)
RDW: 15.6 % — ABNORMAL HIGH (ref 11.5–15.5)
WBC: 12.1 10*3/uL — ABNORMAL HIGH (ref 4.0–10.5)

## 2012-06-05 LAB — GLUCOSE, CAPILLARY
Glucose-Capillary: 114 mg/dL — ABNORMAL HIGH (ref 70–99)
Glucose-Capillary: 130 mg/dL — ABNORMAL HIGH (ref 70–99)

## 2012-06-05 LAB — BASIC METABOLIC PANEL
Calcium: 8 mg/dL — ABNORMAL LOW (ref 8.4–10.5)
Creatinine, Ser: 0.58 mg/dL (ref 0.50–1.10)
GFR calc Af Amer: >90 mL/min (ref >90)
GFR calc non Af Amer: 86 mL/min — ABNORMAL LOW (ref >90)
Sodium: 135 mEq/L (ref 135–145)

## 2012-06-05 MED ORDER — PREDNISONE 50 MG PO TABS
60.0000 mg | ORAL_TABLET | Freq: Once | ORAL | Status: AC
  Administered 2012-06-05: 60 mg via ORAL
  Filled 2012-06-05: qty 1

## 2012-06-05 MED ORDER — CIPROFLOXACIN HCL 500 MG PO TABS
500.0000 mg | ORAL_TABLET | Freq: Two times a day (BID) | ORAL | Status: DC
  Administered 2012-06-05 – 2012-06-06 (×2): 500 mg via ORAL
  Filled 2012-06-05 (×4): qty 1

## 2012-06-05 MED ORDER — PREDNISONE 50 MG PO TABS
50.0000 mg | ORAL_TABLET | Freq: Every day | ORAL | Status: DC
  Administered 2012-06-06: 50 mg via ORAL
  Filled 2012-06-05 (×2): qty 1

## 2012-06-05 MED ORDER — TRANDOLAPRIL 4 MG PO TABS
4.0000 mg | ORAL_TABLET | Freq: Every day | ORAL | Status: DC
  Administered 2012-06-05 – 2012-06-06 (×2): 4 mg via ORAL
  Filled 2012-06-05 (×2): qty 1

## 2012-06-05 MED ORDER — POTASSIUM CHLORIDE 10 MEQ/100ML IV SOLN
10.0000 meq | INTRAVENOUS | Status: AC
  Administered 2012-06-05 (×2): 10 meq via INTRAVENOUS
  Filled 2012-06-05 (×4): qty 100

## 2012-06-05 MED ORDER — VERAPAMIL HCL ER 240 MG PO TBCR
240.0000 mg | EXTENDED_RELEASE_TABLET | Freq: Every day | ORAL | Status: DC
  Administered 2012-06-05 – 2012-06-06 (×2): 240 mg via ORAL
  Filled 2012-06-05 (×2): qty 1

## 2012-06-05 MED ORDER — METRONIDAZOLE 500 MG PO TABS
500.0000 mg | ORAL_TABLET | Freq: Three times a day (TID) | ORAL | Status: DC
  Administered 2012-06-05 – 2012-06-06 (×4): 500 mg via ORAL
  Filled 2012-06-05 (×6): qty 1

## 2012-06-05 NOTE — Progress Notes (Addendum)
Wrong patient note

## 2012-06-05 NOTE — Progress Notes (Signed)
[facility] PROGRESS NOTE  TAMYRA FOJTIK ZOX:[id number] DOB: 1931-05-05 DOA: 06/01/2012 PCP: Carrie Mew, MD  Assessment/Plan  Acute Diverticulitis:  No pain on exam today. Have more formed BMs.   - Ciprofloxacin and Flagyl per Surgery -  Adv diet  - appreciate surgery recommendations -  Cont incentive spirometer   2. DM: fingersticks wnl, likely elevated due to stress response and improved. -  Cont SSI and titrate prn -  hold metformin   3. HTN: elevated BP likely due to pain and held BP meds -  Restart home BP medications -  D/c IV metoprolol  4. Gout: hold allopurinol.  Left knee pain suggestive of gout flare with warmth and pain.   - Start prednisone taper  Weakness: -  Falls precautions -  Iron panel suggestive of iron deficiency or infection -  PT rec no follow up.  OT pendng  Iron deficiency anemia: -  Start iron when patient tolerating full diet.    Hypokalemia, likely due to lack of potassium in IVF -  Replete with IV -  Advance diet  Diet:  Full liquid Access:  PIV  IVF:  NS with KCl at 75 ml/h  Proph:  lovenox  Code Status: full Family Communication: spoke with pt alone Disposition Plan:    To home possibly tomorrow if she continues to improve.  services.     Consultants:  General surgery  Procedures:  CT ab/pel  Antibiotics:  cipro 3/12 >>  Flagyl 3/12 >>   HPI/Subjective:  Denies fevers, chills, SOB, CP.  No abdominal pain.  Formed BM this AM.   Denies N/V.  Has new onset pain in the left knee similar to previous gout pains.    Objective: Filed Vitals:   06/04/12 0452 06/04/12 1500 06/04/12 2139 06/05/12 0505  BP: 153/67 170/69 166/69 165/68  Pulse: 88 79 86 80  Temp: 98.6 F (37 C) 98.5 F (36.9 C) 99.1 F (37.3 C) 98.6 F (37 C)  TempSrc: Oral Oral Oral Oral  Resp: 20 18 18 16   Height:      Weight:      SpO2: 95% 97% 97% 97%    Intake/Output Summary (Last 24 hours) at 06/05/12 0903 Last data filed at  06/05/12 0700  Gross per 24 hour  Intake   3920 ml  Output   2500 ml  Net   1420 ml   Filed Weights   06/01/12 2047  Weight: 79 kg (174 lb 2.6 oz)    Exam:   General:  AAF, No acute distress  HEENT:  NCAT,  MMM  Cardiovascular:  RRR, nl S1, S2 no mrg, 2+ pulses, warm extremities  Respiratory:  CTAB, no increased WOB  Abdomen:  Hyperactive BS, soft, NT/ND  MSK:   Normal tone and bulk, no LEE.  Warmth and TTP of the left knee without effusion.  Possibly some very subtle erythema starting.  Neuro:  Grossly intact  Data Reviewed: Basic Metabolic Panel:  Recent Labs Lab 06/01/12 1314 06/02/12 0325 06/03/12 0443 06/04/12 0528 06/05/12 0525  NA 140 133* 134* 136 135  K 2.7* 3.9 3.7 3.3* 3.4*  CL 98 99 104 105 105  CO2 23 23 23 23 25   GLUCOSE 183* 154* 99 79 102*  BUN 25* 26* 16 14 9   CREATININE 0.75 0.74 0.60 0.50 0.58  CALCIUM 12.6* 9.4 8.2* 8.2* 8.0*   Liver Function Tests:  Recent Labs Lab 06/01/12 1314 06/02/12 0325  AST 40* 27  ALT 27 18  ALKPHOS 102 75  BILITOT 0.4 0.3  PROT 8.4* 6.4  ALBUMIN 4.3 3.0*   No results found for this basename: LIPASE, AMYLASE,  in the last 168 hours No results found for this basename: AMMONIA,  in the last 168 hours CBC:  Recent Labs Lab 06/01/12 1314 06/02/12 0325 06/03/12 0443 06/04/12 0528 06/05/12 0525  WBC 25.1* 25.3* 17.4* 14.4* 12.1*  HGB 14.8 15.0 12.0 11.6* 11.1*  HCT 44.1 42.1 35.6* 34.4* 32.7*  MCV 84.0 81.7 83.6 82.7 82.4  PLT 297 245 207 185 195   Cardiac Enzymes: No results found for this basename: CKTOTAL, CKMB, CKMBINDEX, TROPONINI,  in the last 168 hours BNP (last 3 results) No results found for this basename: PROBNP,  in the last 8760 hours CBG:  Recent Labs Lab 06/04/12 1656 06/04/12 1957 06/05/12 0014 06/05/12 0306 06/05/12 0810  GLUCAP 98 84 100* 112* 114*    Recent Results (from the past 240 hour(s))  MRSA PCR SCREENING     Status: None   Collection Time    06/02/12 12:34  AM      Result Value Range Status   MRSA by PCR NEGATIVE  NEGATIVE Final   Comment:            The GeneXpert MRSA Assay (FDA     approved for NASAL specimens     only), is one component of a     comprehensive MRSA colonization     surveillance program. It is not     intended to diagnose MRSA     infection nor to guide or     monitor treatment for     MRSA infections.     Studies: No results found.  Scheduled Meds: . ciprofloxacin  500 mg Oral BID  . enoxaparin (LOVENOX) injection  40 mg Subcutaneous Q2000  . insulin aspart  0-9 Units Subcutaneous Q4H  . metoprolol  5 mg Intravenous Q6H  . metroNIDAZOLE  500 mg Oral Q8H  . potassium chloride  10 mEq Intravenous Q1 Hr x 2   Continuous Infusions: . 0.9 % NaCl with KCl 20 mEq / L 100 mL/hr at 06/04/12 1958    Principal Problem:   Diverticulitis of sigmoid colon Active Problems:   DIABETES MELLITUS, TYPE II   HYPERTENSION   Sinus tachycardia   Elevated blood pressure   Leukocytosis, unspecified   Hyponatremia   Iron deficiency anemia    Time spent: 30 min    Jumanah Hynson, [facility]  [facility] Pager [phone]. If 7PM-7AM, please contact night-coverage at [email or web address], password [name] 06/05/2012, 9:03 AM  LOS: 4 days

## 2012-06-05 NOTE — Discharge Summary (Addendum)
Physician Discharge Summary  Haley Wilcox:[id number] DOB: [birth year] DOA: 06/01/2012  PCP: Haley Mew, MD  Admit date: 06/01/2012 Discharge date: 06/06/2012  Recommendations for Outpatient Follow-up:  Follow up with primary care doctor in 1 week for fingerstick check, blood pressure check, and review of left knee gout flare.  Consider repeating iron studies after complete resolution of infection (or just starting empiric iron and rechecking in a month).  Defer further management of anemia to PCP.    General surgery, Dr. Dwain Wilcox in 4-6 weeks for follow up of diverticulitis.  Pls refer for colonoscopy to exclude neoplasm once symptoms are completely resolved.   Discharge Diagnoses:  Principal Problem:   Diverticulitis of sigmoid colon Active Problems:   DIABETES MELLITUS, TYPE II   HYPERTENSION   Sinus tachycardia   Elevated blood pressure   Leukocytosis, unspecified   Hyponatremia   Iron deficiency anemia   Idiopathic gout of left knee   Discharge Condition: stable, improved  Diet recommendation: high fiber  Wt Readings from Last 3 Encounters:  06/01/12 79 kg (174 lb 2.6 oz)  05/25/12 78.926 kg (174 lb)  01/25/12 78.019 kg (172 lb)    History of present illness:   Haley Wilcox is a 79 y.o. female with h/o DM, HTN presents to the ER today with vomiting.  She was in her usual state of health until 2 weeks ago, she had been dealing with severe constipation for the last 2 weeks, tried some home remedies, including dulcolax without much benefit.  Last night, tried 3 dulcolax and then started experiencing abdominal cramps after this, then around 4 am started vomiting, and vomited 4-5 times, vomitus was non bloody or bilious.  Finally this morning her daughter gave her an enema, after which she started having BMs and has had several BMs some loose and some with solid stool.  Her daughter checked and found that her heart rate was very high got concerned and  brought her to the ER.  Upon eval in ER she has been tachycardic, WBC of 25K, lactic acid of 5.6 and CT Abd pelvis with colon wall thickening consistent with diverticulitis   Hospital Course:   Acute Diverticulitis:  [name] was hospitalized with diverticulitis.  Ct demonstrated long segment of contiguous colonic wall thickening of the descending colon.  She was made NPO and started on cipro/flagyl and IVF.  She had several small bloody bowel movements without copious hematochezia and without need for blood transfusion.  She clinically improved over the course of a week and was able to tolerate a regular diabetic diet prior to discharge without pain and with normal bowel movements without blood.  She should continue antibiotics by mouth for a total of 14 days and then follow up with general surgery in 4-6 weeks.  She will need an outpatient colonoscopy to exclude neoplasm once diverticulitis is completely resolved.    DM: fingerstick were initially moderately elevated, likely due to stress response and improved with SSI.  Her metformin may be restarted at discharge.    3. HTN: elevated BP likely due to pain and held BP meds initially.  She was started on IV beta blocker while NPO.  Her oral blood pressure medications were restarted on 3/16, but her blood pressure became mildly elevated prior to discharge.  May be related to steroids given for gout flare.  She should continue her BP medications and follow up with her primary care doctor in 1 week for repeat BP check.  Gout:  Held allopurinol while NPO, but developed left knee pain suggestive of gout flare with warmth and pain on 3/16.  She was started on a Haley Wilcox prednisone taper to avoid NSAIDS after recent small GIB and to prevent renal insufficiency in setting of age and diabetes.    Weakness:  She was seen by PT/OT who recommended no outpatient followup.  Was likely related to acute infection.    Iron deficiency anemia:  Suggestion of iron  deficiency on iron studies, however, consider repeating once patient has improved from infection as she may have had some sequestration of iron in setting of severe infection.    Hypokalemia, likely due to lack of potassium in IVF.  Repleted with IV potassium and will likely resolve now that she is eating a regular diet again.  Chronic constipation.  Recommend outpatient TSH if not already complete.  Recommend high fiber diet and starting miralax to titrate to 2-3 soft BMs daily.    Consultants:  General surgery Procedures:  CT ab/pel Antibiotics:  cipro 3/12 >>  Flagyl 3/12 >>    Discharge Exam: Filed Vitals:   06/06/12 0500  BP: 151/60  Pulse: 69  Temp: 98 F (36.7 C)  Resp: 18   Filed Vitals:   06/05/12 1059 06/05/12 1432 06/05/12 1900 06/06/12 0500  BP: 157/60 157/63 154/69 151/60  Pulse:  84 74 69  Temp:  98.4 F (36.9 C) 98.4 F (36.9 C) 98 F (36.7 C)  TempSrc:  Oral Oral Oral  Resp:  16 20 18   Height:      Weight:      SpO2:  97% 98% 98%   General: AAF, No acute distress  HEENT: NCAT, MMM  Cardiovascular: RRR, nl S1, S2 no mrg, 2+ pulses, warm extremities  Respiratory: CTAB, no increased WOB  Abdomen: NABS, soft, NT/ND  MSK: Normal tone and bulk, no LEE. Decreased warmth and pain of the left knee without effusion. Nonerythematous  Neuro: Grossly intact   Discharge Instructions      Discharge Orders   Future Appointments Provider Department Dept Phone   10/07/2012 9:00 AM Haley Glaze, MD [facility] [phone]   Future Orders Complete By Expires     Call MD for:  difficulty breathing, headache or visual disturbances  As directed     Call MD for:  extreme fatigue  As directed     Call MD for:  hives  As directed     Call MD for:  persistant dizziness or light-headedness  As directed     Call MD for:  persistant nausea and vomiting  As directed     Call MD for:  severe uncontrolled pain  As directed     Call MD for:   temperature >100.4  As directed     Diet Carb Modified  As directed     Comments:      High fiber to avoid constipation    Discharge instructions  As directed     Comments:      You were hospitalized with diverticulitis.  You were started on antibiotics, ciprofloxacin and flagyl.  Please continue these antibiotics for a total of 14 days.  The ciprofloxacin is twice daily and your next dose is this evening and the flagyl is three times daily and your next dose is around lunchtime today.  Because your allopurinol needed to be held, you developed a gout flare.  You were started on a Giulianna Rocha steroid taper for your gout.  Please do not restart your allopurinol until seen by your primary care doctor who may want to give you some colchicine at the same time to prevent a flare, but I don't want to give you colchicine while you have active diverticulitis and I do not want you to need long or repeated courses of steroids with your diabetes.  Please continue a Amonte Brookover steroid taper, then stop.  Follow up with your primary care doctor in 1 week for a blood pressure and fingerstick review and follow up with Dr. Dwain Wilcox from general surgery in 4-6 weeks.  Please call the general surgery office if you notice small blood in your stools, develop diarrhea, or if you have mild abdominal cramps.  If you develop more severe symptoms, please return to the emergency department.    Increase activity slowly  As directed         Medication List    STOP taking these medications       allopurinol 300 MG tablet  Commonly known as:  ZYLOPRIM      TAKE these medications       ciprofloxacin 500 MG tablet  Commonly known as:  CIPRO  Take 1 tablet (500 mg total) by mouth 2 (two) times daily.     furosemide 20 MG tablet  Commonly known as:  LASIX  TAKE 1 TABLET (20MG  TOTAL) BY MOUTH DAILY     metFORMIN 500 MG tablet  Commonly known as:  GLUCOPHAGE  TAKE 1 TABLET (500MG  TOTAL) BY MOUTH DAILY WITH BREAKFAST      metroNIDAZOLE 500 MG tablet  Commonly known as:  FLAGYL  Take 1 tablet (500 mg total) by mouth every 8 (eight) hours.     mometasone 0.1 % cream  Commonly known as:  ELOCON  Apply topically as needed.     polyethylene glycol powder powder  Commonly known as:  GLYCOLAX/MIRALAX  Mix 17gm in 4oz of water.  Start with 2 doses per day, but may take up to 6 doses per day.  Titrate number of doses to allow 2-3 soft bowel movements daily.     predniSONE 10 MG tablet  Commonly known as:  DELTASONE  Take 4 tabs on 3/18, 3 tabs on 3/19, 2 tabs on 3/20, 1 tab on 3/21, then stop.     TARKA 4-240 MG per tablet  Generic drug:  trandolapril-verapamil  TAKE 1 TABLET TWICE A DAY       Follow-up Information   Follow up with Haley Mew, MD. Schedule an appointment as soon as possible for a visit in 1 week.   Contact information:   [address] [place] [address] [phone]       Follow up with Emelia Loron, MD. Schedule an appointment as soon as possible for a visit in 4 weeks.   Contact information:   [address] [place] [address] [phone]       The results of significant diagnostics from this hospitalization (including imaging, microbiology, ancillary and laboratory) are listed below for reference.    Significant Diagnostic Studies: Ct Abdomen Pelvis Wo Contrast  06/01/2012  *RADIOLOGY REPORT*  Clinical Data: Abdominal cramping and nausea  CT ABDOMEN AND PELVIS WITHOUT CONTRAST  Technique:  Multidetector CT imaging of the abdomen and pelvis was performed following the standard protocol without intravenous contrast.  Comparison: None.  Findings: Mild moderate cardiomegaly noted.  Curvilinear and patchy subpleural right lower lobe and lingular airspace opacities noted, likely atelectasis.  No pleural effusion.  Hepatic granulomas are noted.  Fluid density lobulated hepatic lesions, largest in the right hepatic lobe measuring 3.9 x 3.5 cm,  compatible with cysts.  A small amount of contrast reflux is noted in the distal esophagus.  Unenhanced spleen, pancreas, left kidney are unremarkable.  Fluid density probable cyst at the inferior mid aspect of the right kidney measures 1.7 cm image 32.  Mild prominence of both adrenal glands is most compatible with hyperplasia.  There is long segmental left colonic wall thickening and surrounding stranding with trace fluid tracking along the left pericolic gutter.  No surrounding rim enhancing fluid collection is identified to suggest abscess.  Diverticuli are noted.  Small bowel is unremarkable.  The appendix is normal.  Right ovary is normal. Left ovary is not well visualized but no left adnexal mass is seen. The bladder is decompressed but unremarkable.  Lumbar spine degenerative changes noted.  No lytic or sclerotic osseous lesion.  IMPRESSION: Long segmental contiguous left colonic wall thickening most compatible with diverticulitis.  If the patient has not had recent colonoscopy, this would be recommended when symptoms resolve in order to exclude neoplasm.  No complicating features such as abscess or free air.   Original Report Authenticated By: Christiana Pellant, M.D.     Microbiology: Recent Results (from the past 240 hour(s))  MRSA PCR SCREENING     Status: None   Collection Time    06/02/12 12:34 AM      Result Value Range Status   MRSA by PCR NEGATIVE  NEGATIVE Final   Comment:            The GeneXpert MRSA Assay (FDA     approved for NASAL specimens     only), is one component of a     comprehensive MRSA colonization     surveillance program. It is not     intended to diagnose MRSA     infection nor to guide or     monitor treatment for     MRSA infections.     Labs: Basic Metabolic Panel:  Recent Labs Lab 06/02/12 0325 06/03/12 0443 06/04/12 0528 06/05/12 0525 06/06/12 0428  NA 133* 134* 136 135 138  K 3.9 3.7 3.3* 3.4* 3.6  CL 99 104 105 105 108  CO2 23 23 23 25 24    GLUCOSE 154* 99 79 102* 107*  BUN 26* 16 14 9 8   CREATININE 0.74 0.60 0.50 0.58 0.63  CALCIUM 9.4 8.2* 8.2* 8.0* 8.2*   Liver Function Tests:  Recent Labs Lab 06/01/12 1314 06/02/12 0325  AST 40* 27  ALT 27 18  ALKPHOS 102 75  BILITOT 0.4 0.3  PROT 8.4* 6.4  ALBUMIN 4.3 3.0*   No results found for this basename: LIPASE, AMYLASE,  in the last 168 hours No results found for this basename: AMMONIA,  in the last 168 hours CBC:  Recent Labs Lab 06/02/12 0325 06/03/12 0443 06/04/12 0528 06/05/12 0525 06/06/12 0428  WBC 25.3* 17.4* 14.4* 12.1* 13.1*  HGB 15.0 12.0 11.6* 11.1* 10.1*  HCT 42.1 35.6* 34.4* 32.7* 29.1*  MCV 81.7 83.6 82.7 82.4 81.7  PLT 245 207 185 195 171   Cardiac Enzymes: No results found for this basename: CKTOTAL, CKMB, CKMBINDEX, TROPONINI,  in the last 168 hours BNP: BNP (last 3 results) No results found for this basename: PROBNP,  in the last 8760 hours CBG:  Recent Labs Lab 06/05/12 1158 06/05/12 1629 06/05/12 2006 06/06/12 0025 06/06/12 0406  GLUCAP 112* 209* 130*  106* 105*    Time coordinating discharge: 45 minutes  Signed:  Kaleigh Wilcox  [facility] 06/06/2012, 8:57 AM

## 2012-06-05 NOTE — Progress Notes (Signed)
  Subjective: Tolerated clears yesterday. Several bms. No n/v. Little to no abd pain. Walked in halls. C/o L knee pain - "gout"  Objective: Vital signs in last 24 hours: Temp:  [98.5 F (36.9 C)-99.1 F (37.3 C)] 98.6 F (37 C) (03/16 0505) Pulse Rate:  [79-86] 80 (03/16 0505) Resp:  [16-18] 16 (03/16 0505) BP: (165-170)/(68-69) 165/68 mmHg (03/16 0505) SpO2:  [97 %] 97 % (03/16 0505) Last BM Date: 06/05/12  Intake/Output from previous day: 03/15 0701 - 03/16 0700 In: 4720 [P.O.:1060; I.V.:2060; IV Piggyback:1600] Out: 2500 [Urine:2500] Intake/Output this shift:    Alert, smiling Obese, soft, nt, nd  Lab Results:   Recent Labs  06/04/12 0528 06/05/12 0525  WBC 14.4* 12.1*  HGB 11.6* 11.1*  HCT 34.4* 32.7*  PLT 185 195   BMET  Recent Labs  06/04/12 0528 06/05/12 0525  NA 136 135  K 3.3* 3.4*  CL 105 105  CO2 23 25  GLUCOSE 79 102*  BUN 14 9  CREATININE 0.50 0.58  CALCIUM 8.2* 8.0*   PT/INR No results found for this basename: LABPROT, INR,  in the last 72 hours ABG No results found for this basename: PHART, PCO2, PO2, HCO3,  in the last 72 hours  Studies/Results: No results found.  Anti-infectives: Anti-infectives   Start     Dose/Rate Route Frequency Ordered Stop   06/05/12 0900  ciprofloxacin (CIPRO) tablet 500 mg     500 mg Oral 2 times daily 06/05/12 0857     06/05/12 0900  metroNIDAZOLE (FLAGYL) tablet 500 mg     500 mg Oral 3 times per day 06/05/12 0857     06/02/12 0800  ciprofloxacin (CIPRO) IVPB 400 mg  Status:  Discontinued     400 mg 200 mL/hr over 60 Minutes Intravenous Every 12 hours 06/01/12 2049 06/05/12 0857   06/02/12 0200  metroNIDAZOLE (FLAGYL) IVPB 500 mg  Status:  Discontinued     500 mg 100 mL/hr over 60 Minutes Intravenous Every 8 hours 06/01/12 2049 06/05/12 0857   06/01/12 1730  ciprofloxacin (CIPRO) IVPB 400 mg  Status:  Discontinued     400 mg 200 mL/hr over 60 Minutes Intravenous  Once 06/01/12 1726 06/01/12 2026   06/01/12 1730  metroNIDAZOLE (FLAGYL) IVPB 500 mg  Status:  Discontinued     500 mg 100 mL/hr over 60 Minutes Intravenous  Once 06/01/12 1726 06/01/12 1926      Assessment/Plan: L colon/sigmoid diverticulitis - doing well with med mgmt. No fever. Wbc down more. Switch to oral cipro/flagyl today. Adv diet to fulls. Will need a total of 2 weeks of abx. Will need outpt colonoscopy in 6-8 weeks by her GI doc.   Defer left knee pain to primary team  Probably home Monday from an abd standpoint  Mary Sella. Andrey Campanile, MD, FACS General, Bariatric, & Minimally Invasive Surgery [facility], [place]   LOS: 4 days    Atilano Ina 06/05/2012

## 2012-06-06 DIAGNOSIS — M10062 Idiopathic gout, left knee: Secondary | ICD-10-CM

## 2012-06-06 DIAGNOSIS — M1A00X0 Idiopathic chronic gout, unspecified site, without tophus (tophi): Secondary | ICD-10-CM

## 2012-06-06 DIAGNOSIS — M1A9XX0 Chronic gout, unspecified, without tophus (tophi): Secondary | ICD-10-CM

## 2012-06-06 DIAGNOSIS — M109 Gout, unspecified: Secondary | ICD-10-CM

## 2012-06-06 LAB — CBC
HCT: 29.1 % — ABNORMAL LOW (ref 36.0–46.0)
Hemoglobin: 10.1 g/dL — ABNORMAL LOW (ref 12.0–15.0)
MCH: 28.4 pg (ref 26.0–34.0)
MCHC: 34.7 g/dL (ref 30.0–36.0)
RBC: 3.56 MIL/uL — ABNORMAL LOW (ref 3.87–5.11)

## 2012-06-06 LAB — BASIC METABOLIC PANEL
BUN: 8 mg/dL (ref 6–23)
Chloride: 108 mEq/L (ref 96–112)
GFR calc Af Amer: >90 mL/min (ref >90)
GFR calc non Af Amer: 84 mL/min — ABNORMAL LOW (ref >90)
Glucose, Bld: 107 mg/dL — ABNORMAL HIGH (ref 70–99)
Potassium: 3.6 mEq/L (ref 3.5–5.1)
Sodium: 138 mEq/L (ref 135–145)

## 2012-06-06 LAB — GLUCOSE, CAPILLARY: Glucose-Capillary: 105 mg/dL — ABNORMAL HIGH (ref 70–99)

## 2012-06-06 MED ORDER — METRONIDAZOLE 500 MG PO TABS: 500.0000 mg | ORAL_TABLET | Freq: Three times a day (TID) | ORAL | Status: DC

## 2012-06-06 MED ORDER — PREDNISONE 10 MG PO TABS: ORAL_TABLET | ORAL | Status: DC

## 2012-06-06 MED ORDER — POLYETHYLENE GLYCOL 3350 17 GM/SCOOP PO POWD: ORAL | Status: AC

## 2012-06-06 MED ORDER — CIPROFLOXACIN HCL 500 MG PO TABS: 500.0000 mg | ORAL_TABLET | Freq: Two times a day (BID) | ORAL | Status: DC

## 2012-06-06 NOTE — Progress Notes (Signed)
General Surgery [facility]  Patient prepared for discharge.  Will arrange follow up for colonoscopy and with Dr. Dwain Sarna at [facility] office.  Velora Heckler, MD, [facility] Office: [phone]

## 2012-06-06 NOTE — Progress Notes (Signed)
Patient ID: Carmina Miller, female   DOB: 02-13-1932, 79 y.o.   MRN: [id number]    Subjective: Feels good today, denies n/v, no pain or fevers, +bm and flatus, ready to go home  Objective: Vital signs in last 24 hours: Temp:  [98 F (36.7 C)-98.4 F (36.9 C)] 98 F (36.7 C) (03/17 0500) Pulse Rate:  [69-84] 69 (03/17 0500) Resp:  [16-20] 18 (03/17 0500) BP: (151-157)/(60-69) 151/60 mmHg (03/17 0500) SpO2:  [97 %-98 %] 98 % (03/17 0500) Last BM Date: 06/05/12  Intake/Output from previous day: 03/16 0701 - 03/17 0700 In: 2440 [P.O.:390; I.V.:1650; IV Piggyback:400] Out: 300 [Urine:300] Intake/Output this shift: Total I/O In: -  Out: 125 [Urine:125]  PE: Abd: non tender, no peritonitis, +BS General: awake, alert, nad  Lab Results:   Recent Labs  06/05/12 0525 06/06/12 0428  WBC 12.1* 13.1*  HGB 11.1* 10.1*  HCT 32.7* 29.1*  PLT 195 171   BMET  Recent Labs  06/05/12 0525 06/06/12 0428  NA 135 138  K 3.4* 3.6  CL 105 108  CO2 25 24  GLUCOSE 102* 107*  BUN 9 8  CREATININE 0.58 0.63  CALCIUM 8.0* 8.2*   PT/INR No results found for this basename: LABPROT, INR,  in the last 72 hours CMP     Component Value Date/Time   NA 138 06/06/2012 0428   K 3.6 06/06/2012 0428   CL 108 06/06/2012 0428   CO2 24 06/06/2012 0428   GLUCOSE 107* 06/06/2012 0428   BUN 8 06/06/2012 0428   CREATININE 0.63 06/06/2012 0428   CALCIUM 8.2* 06/06/2012 0428   PROT 6.4 06/02/2012 0325   ALBUMIN 3.0* 06/02/2012 0325   AST 27 06/02/2012 0325   ALT 18 06/02/2012 0325   ALKPHOS 75 06/02/2012 0325   BILITOT 0.3 06/02/2012 0325   GFRNONAA 84* 06/06/2012 0428   GFRAA >90 06/06/2012 0428   Lipase  No results found for this basename: lipase       Studies/Results: No results found.  Anti-infectives: Anti-infectives   Start     Dose/Rate Route Frequency Ordered Stop   06/06/12 0000  ciprofloxacin (CIPRO) 500 MG tablet     500 mg Oral 2 times daily 06/06/12 0845     06/06/12 0000   metroNIDAZOLE (FLAGYL) 500 MG tablet     500 mg Oral Every 8 hours 06/06/12 0845     06/05/12 2000  ciprofloxacin (CIPRO) tablet 500 mg     500 mg Oral 2 times daily 06/05/12 0857     06/05/12 1000  metroNIDAZOLE (FLAGYL) tablet 500 mg     500 mg Oral Every 8 hours 06/05/12 0857     06/02/12 0800  ciprofloxacin (CIPRO) IVPB 400 mg  Status:  Discontinued     400 mg 200 mL/hr over 60 Minutes Intravenous Every 12 hours 06/01/12 2049 06/05/12 0857   06/02/12 0200  metroNIDAZOLE (FLAGYL) IVPB 500 mg  Status:  Discontinued     500 mg 100 mL/hr over 60 Minutes Intravenous Every 8 hours 06/01/12 2049 06/05/12 0857   06/01/12 1730  ciprofloxacin (CIPRO) IVPB 400 mg  Status:  Discontinued     400 mg 200 mL/hr over 60 Minutes Intravenous  Once 06/01/12 1726 06/01/12 2026   06/01/12 1730  metroNIDAZOLE (FLAGYL) IVPB 500 mg  Status:  Discontinued     500 mg 100 mL/hr over 60 Minutes Intravenous  Once 06/01/12 1726 06/01/12 1926       Assessment/Plan 1. Sigmoid diverticulitis: doing well,  pain resolved, going home today, needs f/u with Dr. Dwain Sarna in 4-6 weeks Continue cipro/flagyl for total of 2 weeks  LOS: 5 days    WHITE, ELIZABETH 06/06/2012

## 2012-06-07 LAB — GLUCOSE, CAPILLARY: Glucose-Capillary: 88 mg/dL (ref 70–99)

## 2012-06-13 ENCOUNTER — Ambulatory Visit (INDEPENDENT_AMBULATORY_CARE_PROVIDER_SITE_OTHER): Payer: Medicare Other | Admitting: Family

## 2012-06-13 ENCOUNTER — Encounter: Payer: Self-pay | Admitting: Family

## 2012-06-13 VITALS — BP 130/76 | HR 86 | Wt 166.0 lb

## 2012-06-13 DIAGNOSIS — K5900 Constipation, unspecified: Secondary | ICD-10-CM | POA: Diagnosis not present

## 2012-06-13 DIAGNOSIS — K219 Gastro-esophageal reflux disease without esophagitis: Secondary | ICD-10-CM

## 2012-06-13 MED ORDER — OMEPRAZOLE 20 MG PO CPDR: 20.0000 mg | DELAYED_RELEASE_CAPSULE | Freq: Every day | ORAL | Status: DC

## 2012-06-13 NOTE — Progress Notes (Signed)
Subjective:    Patient ID: Haley Wilcox, female    DOB: 1931/12/31, 79 y.o.   MRN: [id number]  HPI 79 year old patient of Dr. Lovell Sheehan that presents to the office today as follow-up from hospitalization on 06/01/12 for constipation and diverticulitis. She verbalizes she is feeling much better, bowels are moving again, she is slowly re-introducing varied foods back to her diet. She has resumed previously prescribed medications with the exception of medications for gout and is currently taking Ciprofloxacin BID through Wednesday 06/15/12.  Patient verbalizes having some discomfort and  difficulty with acid reflux and gas following meals since she was discharged from the hospital. She takes tums that helps but symptoms return.     Review of Systems  Constitutional: Negative.   HENT: Negative.   Eyes: Negative.   Respiratory: Negative.   Cardiovascular: Negative.   Gastrointestinal: Positive for abdominal pain. Negative for nausea, vomiting, constipation and abdominal distention.  Endocrine: Negative.   Genitourinary: Negative.   Musculoskeletal: Negative.   Skin: Negative.   Neurological: Negative.   Hematological: Negative.   Psychiatric/Behavioral: Negative.    Past Medical History  Diagnosis Date  . Gout   . Hypertension   . Diabetes mellitus   . Hyperlipidemia     History   Social History  . Marital Status: Married    Spouse Name: N/A    Number of Children: N/A  . Years of Education: N/A   Occupational History  . Not on file.   Social History Main Topics  . Smoking status: Never Smoker   . Smokeless tobacco: Never Used  . Alcohol Use: No  . Drug Use: No  . Sexually Active: Yes   Other Topics Concern  . Not on file   Social History Narrative  . No narrative on file    Past Surgical History  Procedure Laterality Date  . Abdominal hysterectomy    . D &c    . Dilation and curettage of uterus    . Tubal ligation    . Eye surgery      bilateral cataract     Family History  Problem Relation Age of Onset  . Hypertension Mother     Allergies  Allergen Reactions  . Fluoride Preparations   . Ivp Dye (Iodinated Diagnostic Agents)     Current Outpatient Prescriptions on File Prior to Visit  Medication Sig Dispense Refill  . ciprofloxacin (CIPRO) 500 MG tablet Take 1 tablet (500 mg total) by mouth 2 (two) times daily.  16 tablet  0  . furosemide (LASIX) 20 MG tablet TAKE 1 TABLET (20MG  TOTAL) BY MOUTH DAILY  90 tablet  0  . metFORMIN (GLUCOPHAGE) 500 MG tablet TAKE 1 TABLET (500MG  TOTAL) BY MOUTH DAILY WITH BREAKFAST  90 tablet  0  . metroNIDAZOLE (FLAGYL) 500 MG tablet Take 1 tablet (500 mg total) by mouth every 8 (eight) hours.  24 tablet  0  . mometasone (ELOCON) 0.1 % cream Apply topically as needed.        . polyethylene glycol powder (GLYCOLAX/MIRALAX) powder Mix 17gm in 4oz of water.  Start with 2 doses per day, but may take up to 6 doses per day.  Titrate number of doses to allow 2-3 soft bowel movements daily.  850 g  1  . TARKA 4-240 MG per tablet TAKE 1 TABLET TWICE A DAY  180 tablet  0  . predniSONE (DELTASONE) 10 MG tablet Take 4 tabs on 3/18, 3 tabs on 3/19, 2 tabs  on 3/20, 1 tab on 3/21, then stop.  10 tablet  0   No current facility-administered medications on file prior to visit.    BP 130/76  Pulse 86  Wt 166 lb (75.297 kg)  BMI 30.35 kg/m2  SpO2 98%chart    Objective:   Physical Exam  Constitutional: She is oriented to person, place, and time. She appears well-developed and well-nourished.  HENT:  Right Ear: External ear normal.  Left Ear: External ear normal.  Mouth/Throat: Oropharynx is clear and moist.  Neck: Normal range of motion.  Cardiovascular: Normal rate, regular rhythm and normal heart sounds.   Pulmonary/Chest: Effort normal and breath sounds normal.  Abdominal: Soft. Bowel sounds are normal.  Musculoskeletal: Normal range of motion.  Neurological: She is alert and oriented to person, place, and  time. She has normal reflexes.  Skin: Skin is warm and dry.  Psychiatric: She has a normal mood and affect.          Assessment & Plan:  Assessment 1: Follow-up assessment  from hospitalization 2. GERD 3. Constipation  Plan: Education and information provided to patient about GERD along with foods to avoid. Ordered Prilosec daily for s/sx GERD. Encouraged increase of fiber to diet with consumption of 6-8 full glasses of water daily. Patient to continue use of Metamucil daily prn for prevention of constipation. Hold gout medications until gut has stabilized. Return for follow up visit with physician as scheduled.

## 2012-06-13 NOTE — Patient Instructions (Signed)
Diet for Gastroesophageal Reflux Disease, Adult Reflux (acid reflux) is when acid from your stomach flows up into the esophagus. When acid comes in contact with the esophagus, the acid causes irritation and soreness (inflammation) in the esophagus. When reflux happens often or so severely that it causes damage to the esophagus, it is called gastroesophageal reflux disease (GERD). Nutrition therapy can help ease the discomfort of GERD. FOODS OR DRINKS TO AVOID OR LIMIT  Smoking or chewing tobacco. Nicotine is one of the most potent stimulants to acid production in the gastrointestinal tract.  Caffeinated and decaffeinated coffee and black tea.  Regular or low-calorie carbonated beverages or energy drinks (caffeine-free carbonated beverages are allowed).   Strong spices, such as black pepper, white pepper, red pepper, cayenne, curry powder, and chili powder.  Peppermint or spearmint.  Chocolate.  High-fat foods, including meats and fried foods. Extra added fats including oils, butter, salad dressings, and nuts. Limit these to less than 8 tsp per day.  Fruits and vegetables if they are not tolerated, such as citrus fruits or tomatoes.  Alcohol.  Any food that seems to aggravate your condition. If you have questions regarding your diet, call your caregiver or a registered dietitian. OTHER THINGS THAT MAY HELP GERD INCLUDE:   Eating your meals slowly, in a relaxed setting.  Eating 5 to 6 small meals per day instead of 3 large meals.  Eliminating food for a period of time if it causes distress.  Not lying down until 3 hours after eating a meal.  Keeping the head of your bed raised 6 to 9 inches (15 to 23 cm) by using a foam wedge or blocks under the legs of the bed. Lying flat may make symptoms worse.  Being physically active. Weight loss may be helpful in reducing reflux in overweight or obese adults.  Wear loose fitting clothing EXAMPLE MEAL PLAN This meal plan is approximately  2,000 calories based on https://www.bernard.org/ meal planning guidelines. Breakfast   cup cooked oatmeal.  1 cup strawberries.  1 cup low-fat milk.  1 oz almonds. Snack  1 cup cucumber slices.  6 oz yogurt (made from low-fat or fat-free milk). Lunch  2 slice whole-wheat bread.  2 oz sliced [place].  2 tsp mayonnaise.  1 cup blueberries.  1 cup snap peas. Snack  6 whole-wheat crackers.  1 oz string cheese. Dinner   cup brown rice.  1 cup mixed veggies.  1 tsp olive oil.  3 oz grilled fish. Document Released: 03/09/2005 Document Revised: 06/01/2011 Document Reviewed: 01/23/2011 [facility] Patient Information 2013 [place], [place].   Constipation, Adult Constipation is when a person has fewer than 3 bowel movements a week; has difficulty having a bowel movement; or has stools that are dry, hard, or larger than normal. As people grow older, constipation is more common. If you try to fix constipation with medicines that make you have a bowel movement (laxatives), the problem may get worse. Long-term laxative use may cause the muscles of the colon to become weak. A low-fiber diet, not taking in enough fluids, and taking certain medicines may make constipation worse. CAUSES   Certain medicines, such as antidepressants, pain medicine, iron supplements, antacids, and water pills.   Certain diseases, such as diabetes, irritable bowel syndrome (IBS), thyroid disease, or depression.   Not drinking enough water.   Not eating enough fiber-rich foods.   Stress or travel.  Lack of physical activity or exercise.  Not going to the restroom when there is the urge  to have a bowel movement.  Ignoring the urge to have a bowel movement.  Using laxatives too much. SYMPTOMS   Having fewer than 3 bowel movements a week.   Straining to have a bowel movement.   Having hard, dry, or larger than normal stools.   Feeling full or bloated.   Pain in the lower  abdomen.  Not feeling relief after having a bowel movement. DIAGNOSIS  Your caregiver will take a medical history and perform a physical exam. Further testing may be done for severe constipation. Some tests may include:   A barium enema X-ray to examine your rectum, colon, and sometimes, your small intestine.  A sigmoidoscopy to examine your lower colon.  A colonoscopy to examine your entire colon. TREATMENT  Treatment will depend on the severity of your constipation and what is causing it. Some dietary treatments include drinking more fluids and eating more fiber-rich foods. Lifestyle treatments may include regular exercise. If these diet and lifestyle recommendations do not help, your caregiver may recommend taking over-the-counter laxative medicines to help you have bowel movements. Prescription medicines may be prescribed if over-the-counter medicines do not work.  HOME CARE INSTRUCTIONS   Increase dietary fiber in your diet, such as fruits, vegetables, whole grains, and beans. Limit high-fat and processed sugars in your diet, such as [place] fries, hamburgers, cookies, candies, and soda.   A fiber supplement may be added to your diet if you cannot get enough fiber from foods.   Drink enough fluids to keep your urine clear or pale yellow.   Exercise regularly or as directed by your caregiver.   Go to the restroom when you have the urge to go. Do not hold it.  Only take medicines as directed by your caregiver. Do not take other medicines for constipation without talking to your caregiver first. SEEK IMMEDIATE MEDICAL CARE IF:   You have bright red blood in your stool.   Your constipation lasts for more than 4 days or gets worse.   You have abdominal or rectal pain.   You have thin, pencil-like stools.  You have unexplained weight loss. MAKE SURE YOU:   Understand these instructions.  Will watch your condition.  Will get help right away if you are not doing well or  get worse. Document Released: 12/06/2003 Document Revised: 06/01/2011 Document Reviewed: 02/10/2011 [facility] Patient Information 2013 [place], [place].

## 2012-10-07 ENCOUNTER — Encounter: Payer: Self-pay | Admitting: Internal Medicine

## 2012-10-07 ENCOUNTER — Ambulatory Visit (INDEPENDENT_AMBULATORY_CARE_PROVIDER_SITE_OTHER): Payer: Medicare Other | Admitting: Internal Medicine

## 2012-10-07 VITALS — BP 130/70 | HR 72 | Temp 98.6°F | Resp 16 | Ht 62.0 in | Wt 174.0 lb

## 2012-10-07 DIAGNOSIS — I10 Essential (primary) hypertension: Secondary | ICD-10-CM | POA: Diagnosis not present

## 2012-10-07 DIAGNOSIS — Z0000 Encounter for general adult medical examination without abnormal findings: Secondary | ICD-10-CM

## 2012-10-07 DIAGNOSIS — D509 Iron deficiency anemia, unspecified: Secondary | ICD-10-CM | POA: Diagnosis not present

## 2012-10-07 DIAGNOSIS — E119 Type 2 diabetes mellitus without complications: Secondary | ICD-10-CM | POA: Diagnosis not present

## 2012-10-07 DIAGNOSIS — E785 Hyperlipidemia, unspecified: Secondary | ICD-10-CM | POA: Diagnosis not present

## 2012-10-07 DIAGNOSIS — M1A00X0 Idiopathic chronic gout, unspecified site, without tophus (tophi): Secondary | ICD-10-CM

## 2012-10-07 LAB — CBC WITH DIFFERENTIAL/PLATELET
Basophils Absolute: 0.1 10*3/uL (ref 0.0–0.1)
Eosinophils Absolute: 0.1 10*3/uL (ref 0.0–0.7)
HCT: 38.8 % (ref 36.0–46.0)
Hemoglobin: 13 g/dL (ref 12.0–15.0)
Lymphs Abs: 2.4 10*3/uL (ref 0.7–4.0)
MCHC: 33.6 g/dL (ref 30.0–36.0)
Neutro Abs: 5 10*3/uL (ref 1.4–7.7)
RDW: 14.9 % — ABNORMAL HIGH (ref 11.5–14.6)

## 2012-10-07 LAB — BASIC METABOLIC PANEL
GFR: 103.89 mL/min (ref >60.00)
Glucose, Bld: 74 mg/dL (ref 70–99)
Potassium: 4.3 mEq/L (ref 3.5–5.1)
Sodium: 139 mEq/L (ref 135–145)

## 2012-10-07 LAB — URIC ACID: Uric Acid, Serum: 6.7 mg/dL (ref 2.4–7.0)

## 2012-10-07 LAB — HEPATIC FUNCTION PANEL
Alkaline Phosphatase: 75 U/L (ref 39–117)
Bilirubin, Direct: 0 mg/dL (ref 0.0–0.3)

## 2012-10-07 LAB — LIPID PANEL
Total CHOL/HDL Ratio: 4
VLDL: 40.8 mg/dL — ABNORMAL HIGH (ref 0.0–40.0)

## 2012-10-07 MED ORDER — MOMETASONE FUROATE 0.1 % EX CREA: TOPICAL_CREAM | CUTANEOUS | Status: DC | PRN

## 2012-10-07 MED ORDER — METFORMIN HCL 500 MG PO TABS: ORAL_TABLET | ORAL | Status: DC

## 2012-10-07 NOTE — Progress Notes (Signed)
Subjective:    Patient ID: Haley Wilcox, female    DOB: October 23, 1931, 79 y.o.   MRN: [id number]  HPI  Patient presents for Medicare wellness examination.  She is followed for adult onset diabetes and hypertension she has a history of gout she has a history of an iron deficiency anemia that occurred around about of diverticulitis for which she was hospitalized in the month of March 2014.  During that hospital admission she also was found to have an iron deficiency anemia  Review of Systems  Constitutional: Positive for fatigue. Negative for activity change and appetite change.  HENT: Negative for ear pain, congestion, neck pain, postnasal drip and sinus pressure.   Eyes: Negative for redness and visual disturbance.  Respiratory: Negative for cough, shortness of breath and wheezing.   Gastrointestinal: Negative for abdominal pain and abdominal distention.  Genitourinary: Negative for dysuria, frequency and menstrual problem.  Musculoskeletal: Negative for myalgias, joint swelling and arthralgias.  Skin: Negative for rash and wound.  Neurological: Negative for dizziness, weakness and headaches.  Hematological: Negative for adenopathy. Does not bruise/bleed easily.  Psychiatric/Behavioral: Negative for sleep disturbance and decreased concentration.       Objective:   Physical Exam  Constitutional: She is oriented to person, place, and time. She appears well-developed. She appears distressed.  HENT:  Head: Normocephalic and atraumatic.  Right Ear: External ear normal.  Eyes: Conjunctivae and EOM are normal. Pupils are equal, round, and reactive to light.  Neck: Normal range of motion. Neck supple. No JVD present. No tracheal deviation present. No thyromegaly present.  Cardiovascular: Normal rate and regular rhythm.   Murmur heard. Pulmonary/Chest: Effort normal and breath sounds normal. She has no wheezes. She exhibits no tenderness.  Abdominal: Soft. Bowel sounds are normal.   Musculoskeletal: Normal range of motion. She exhibits no edema and no tenderness.  Lymphadenopathy:    She has no cervical adenopathy.  Neurological: She is alert and oriented to person, place, and time. She has normal reflexes. No cranial nerve deficit.  Skin: Skin is warm and dry. She is not diaphoretic.  Psychiatric:  depression          Assessment & Plan:  HTN and AODM  stable Monitoring labs No recent gout Iron def. Anemia diagnosed in hospital in [facility] Hospitalized in March for diverticulitis hypokalemia and iron deficiency anemia were diagnosed at that time monitoring today to make sure that deficiency has resolved and her potassium has normalized A1c should be obtained to monitor diabetes lipid panel should be obtained as a part diabetic monitoring Subjective:    Haley Wilcox is a 79 y.o. female who presents for Medicare Annual/Subsequent preventive examination.  Preventive Screening-Counseling & Management  Tobacco History  Smoking status  . Never Smoker   Smokeless tobacco  . Never Used     Problems Prior to Visit 1.   Current Problems (verified) Patient Active Problem List   Diagnosis Date Noted  . Idiopathic gout of left knee 06/06/2012  . Iron deficiency anemia 06/03/2012  . Sinus tachycardia 06/02/2012  . Other and unspecified hyperlipidemia 10/23/2010  . DIABETES MELLITUS, TYPE II 11/23/2006  . HYPERTENSION 10/13/2006    Medications Prior to Visit Current Outpatient Prescriptions on File Prior to Visit  Medication Sig Dispense Refill  . furosemide (LASIX) 20 MG tablet TAKE 1 TABLET (20MG  TOTAL) BY MOUTH DAILY  90 tablet  0  . metroNIDAZOLE (FLAGYL) 500 MG tablet Take 1 tablet (500 mg total) by mouth every 8 (eight) hours.  24 tablet  0  . omeprazole (PRILOSEC) 20 MG capsule Take 1 capsule (20 mg total) by mouth daily.  30 capsule  3  . polyethylene glycol powder (GLYCOLAX/MIRALAX) powder Mix 17gm in 4oz of water.  Start with 2 doses per day,  but may take up to 6 doses per day.  Titrate number of doses to allow 2-3 soft bowel movements daily.  850 g  1  . TARKA 4-240 MG per tablet TAKE 1 TABLET TWICE A DAY  180 tablet  0   No current facility-administered medications on file prior to visit.    Current Medications (verified) Current Outpatient Prescriptions  Medication Sig Dispense Refill  . furosemide (LASIX) 20 MG tablet TAKE 1 TABLET (20MG  TOTAL) BY MOUTH DAILY  90 tablet  0  . metFORMIN (GLUCOPHAGE) 500 MG tablet TAKE 1 TABLET (500MG  TOTAL) BY MOUTH DAILY WITH BREAKFAST  90 tablet  3  . metroNIDAZOLE (FLAGYL) 500 MG tablet Take 1 tablet (500 mg total) by mouth every 8 (eight) hours.  24 tablet  0  . mometasone (ELOCON) 0.1 % cream Apply topically as needed. Use on affected area bid  45 g  3  . omeprazole (PRILOSEC) 20 MG capsule Take 1 capsule (20 mg total) by mouth daily.  30 capsule  3  . polyethylene glycol powder (GLYCOLAX/MIRALAX) powder Mix 17gm in 4oz of water.  Start with 2 doses per day, but may take up to 6 doses per day.  Titrate number of doses to allow 2-3 soft bowel movements daily.  850 g  1  . TARKA 4-240 MG per tablet TAKE 1 TABLET TWICE A DAY  180 tablet  0   No current facility-administered medications for this visit.     Allergies (verified) Fluoride preparations and Ivp dye   PAST HISTORY  Family History Family History  Problem Relation Age of Onset  . Hypertension Mother     Social History History  Substance Use Topics  . Smoking status: Never Smoker   . Smokeless tobacco: Never Used  . Alcohol Use: No     Are there smokers in your home (other than you)? No  Risk Factors Current exercise habits: The patient does not participate in regular exercise at present.  Dietary issues discussed: diet   Cardiac risk factors: diabetes mellitus, hypertension and sedentary lifestyle.  Depression Screen (Note: if answer to either of the following is "Yes", a more complete depression screening is  indicated)   Over the past two weeks, have you felt down, depressed or hopeless? Yes  Over the past two weeks, have you felt little interest or pleasure in doing things? No  Have you lost interest or pleasure in daily life? No  Do you often feel hopeless? No  Do you cry easily over simple problems? Yes  Activities of Daily Living In your present state of health, do you have any difficulty performing the following activities?:  Driving? No Managing money?  No Feeding yourself? No Getting from bed to chair? No Climbing a flight of stairs? No Preparing food and eating?: No Bathing or showering? No Getting dressed: No Getting to the toilet? No Using the toilet:No Moving around from place to place: No In the past year have you fallen or had a near fall?:No   Are you sexually active?  No  Do you have more than one partner?  No  Hearing Difficulties: No Do you often ask people to speak up or repeat themselves? No Do you experience ringing  or noises in your ears? No Do you have difficulty understanding soft or whispered voices? No   Do you feel that you have a problem with memory? No  Do you often misplace items? No  Do you feel safe at home?  Yes  Cognitive Testing  Alert? Yes  Normal Appearance?Yes  Oriented to person? Yes  Place? Yes   Time? Yes  Recall of three objects?  Yes  Can perform simple calculations? Yes  Displays appropriate judgment?Yes  Can read the correct time from a watch face?Yes   Advanced Directives have been discussed with the patient? Yes  List the Names of Other Physician/Practitioners you currently use: 1.    Indicate any recent Medical Services you may have received from other than [facility] providers in the past year (date may be approximate).  Immunization History  Administered Date(s) Administered  . Influenza Split 12/22/2010  . Influenza Whole 03/23/2002, 12/23/2007, 12/17/2008, 12/18/2009  . Pneumococcal Polysaccharide 03/23/2000  . Td  03/23/2005    Screening Tests Health Maintenance  Topic Date Due  . Zostavax  01/28/1994  . Influenza Vaccine  11/21/2012  . Tetanus/tdap  03/24/2015  . Colonoscopy  06/07/2016  . Pneumococcal Polysaccharide Vaccine Age 76 And Over  Completed    All answers were reviewed with the patient and necessary referrals were made:  Carrie Mew, MD   10/07/2012   History reviewed: allergies, current medications, past family history, past medical history, past social history, past surgical history and problem list  Review of Systems Pertinent items are noted in HPI.    Objective:     Vision by Snellen chart: right eye:20/20, left eye:20/20  Body mass index is 31.82 kg/(m^2). BP 130/70  Pulse 72  Temp(Src) 98.6 F (37 C)  Resp 16  Ht 5\' 2"  (1.575 m)  Wt 174 lb (78.926 kg)  BMI 31.82 kg/m2  BP 130/70  Pulse 72  Temp(Src) 98.6 F (37 C)  Resp 16  Ht 5\' 2"  (1.575 m)  Wt 174 lb (78.926 kg)  BMI 31.82 kg/m2  General Appearance:    Alert, cooperative, no distress, appears stated age  Head:    Normocephalic, without obvious abnormality, atraumatic  Eyes:    PERRL, conjunctiva/corneas clear, EOM's intact, fundi    benign, both eyes  Ears:    Normal TM's and external ear canals, both ears  Nose:   Nares normal, septum midline, mucosa normal, no drainage    or sinus tenderness  Throat:   Lips, mucosa, and tongue normal; teeth and gums normal  Neck:   Supple, symmetrical, trachea midline, no adenopathy;    thyroid:  no enlargement/tenderness/nodules; no carotid   bruit or JVD  Back:     Symmetric, no curvature, ROM normal, no CVA tenderness  Lungs:     Clear to auscultation bilaterally, respirations unlabored  Chest Wall:    No tenderness or deformity   Heart:    Regular rate and rhythm, S1 and S2 normal, no murmur, rub   or gallop  Breast Exam:    No tenderness, masses, or nipple abnormality  Abdomen:     Soft, non-tender, bowel sounds active all four quadrants,    no  masses, no organomegaly  Genitalia:    Normal female without lesion, discharge or tenderness  Rectal:    Normal tone, normal prostate, no masses or tenderness;   guaiac negative stool  Extremities:   Extremities normal, atraumatic, no cyanosis or edema  Pulses:   2+ and symmetric all extremities  Skin:   Skin color, texture, turgor normal, no rashes or lesions  Lymph nodes:   Cervical, supraclavicular, and axillary nodes normal  Neurologic:   CNII-XII intact, normal strength, sensation and reflexes    throughout       Assessment:      This is a routine physical examination for this healthy  Female. Reviewed all health maintenance protocols including mammography colonoscopy bone density and reviewed appropriate screening labs. Her immunization history was reviewed as well as her current medications and allergies refills of her chronic medications were given and the plan for yearly health maintenance was discussed all orders and referrals were made as appropriate.      Plan:     During the course of the visit the patient was educated and counseled about appropriate screening and preventive services including:    Influenza vaccine  Td vaccine  Bone densitometry screening  Diet review for nutrition referral? Yes ____  Not Indicated ____   Patient Instructions (the written plan) was given to the patient.  Medicare Attestation I have personally reviewed: The patient's medical and social history Their use of alcohol, tobacco or illicit drugs Their current medications and supplements The patient's functional ability including ADLs,fall risks, home safety risks, cognitive, and hearing and visual impairment Diet and physical activities Evidence for depression or mood disorders  The patient's weight, height, BMI, and visual acuity have been recorded in the chart.  I have made referrals, counseling, and provided education to the patient based on review of the above and I have  provided the patient with a written personalized care plan for preventive services.     Carrie Mew, MD   10/07/2012

## 2012-10-18 ENCOUNTER — Encounter: Payer: Self-pay | Admitting: Internal Medicine

## 2012-10-18 ENCOUNTER — Other Ambulatory Visit: Payer: Self-pay | Admitting: *Deleted

## 2012-11-25 ENCOUNTER — Other Ambulatory Visit: Payer: Self-pay | Admitting: Internal Medicine

## 2012-12-28 DIAGNOSIS — Z1231 Encounter for screening mammogram for malignant neoplasm of breast: Secondary | ICD-10-CM | POA: Diagnosis not present

## 2013-01-11 ENCOUNTER — Encounter: Payer: Self-pay | Admitting: Internal Medicine

## 2013-04-10 ENCOUNTER — Encounter: Payer: Self-pay | Admitting: Internal Medicine

## 2013-04-10 ENCOUNTER — Ambulatory Visit (INDEPENDENT_AMBULATORY_CARE_PROVIDER_SITE_OTHER): Payer: Medicare Other | Admitting: Internal Medicine

## 2013-04-10 VITALS — BP 150/74 | HR 76 | Temp 98.2°F | Resp 16 | Ht 62.0 in | Wt 173.0 lb

## 2013-04-10 DIAGNOSIS — D649 Anemia, unspecified: Secondary | ICD-10-CM

## 2013-04-10 DIAGNOSIS — E1165 Type 2 diabetes mellitus with hyperglycemia: Secondary | ICD-10-CM

## 2013-04-10 DIAGNOSIS — IMO0001 Reserved for inherently not codable concepts without codable children: Secondary | ICD-10-CM

## 2013-04-10 DIAGNOSIS — E876 Hypokalemia: Secondary | ICD-10-CM | POA: Diagnosis not present

## 2013-04-10 DIAGNOSIS — I10 Essential (primary) hypertension: Secondary | ICD-10-CM | POA: Diagnosis not present

## 2013-04-10 DIAGNOSIS — T887XXA Unspecified adverse effect of drug or medicament, initial encounter: Secondary | ICD-10-CM

## 2013-04-10 DIAGNOSIS — Z8739 Personal history of other diseases of the musculoskeletal system and connective tissue: Secondary | ICD-10-CM | POA: Diagnosis not present

## 2013-04-10 LAB — CBC WITH DIFFERENTIAL/PLATELET
Basophils Absolute: 0 10*3/uL (ref 0.0–0.1)
Basophils Relative: 0.3 % (ref 0.0–3.0)
EOS PCT: 1.6 % (ref 0.0–5.0)
Eosinophils Absolute: 0.1 10*3/uL (ref 0.0–0.7)
HEMATOCRIT: 40.6 % (ref 36.0–46.0)
HEMOGLOBIN: 13.2 g/dL (ref 12.0–15.0)
LYMPHS ABS: 2 10*3/uL (ref 0.7–4.0)
Lymphocytes Relative: 27 % (ref 12.0–46.0)
MCHC: 32.5 g/dL (ref 30.0–36.0)
MCV: 85.9 fl (ref 78.0–100.0)
MONO ABS: 0.5 10*3/uL (ref 0.1–1.0)
MONOS PCT: 7.3 % (ref 3.0–12.0)
NEUTROS ABS: 4.8 10*3/uL (ref 1.4–7.7)
Neutrophils Relative %: 63.8 % (ref 43.0–77.0)
Platelets: 246 10*3/uL (ref 150.0–400.0)
RBC: 4.73 Mil/uL (ref 3.87–5.11)
RDW: 14.7 % — AB (ref 11.5–14.6)
WBC: 7.6 10*3/uL (ref 4.5–10.5)

## 2013-04-10 LAB — COMPREHENSIVE METABOLIC PANEL
ALBUMIN: 4.1 g/dL (ref 3.5–5.2)
ALK PHOS: 77 U/L (ref 39–117)
ALT: 14 U/L (ref 0–35)
AST: 18 U/L (ref 0–37)
BILIRUBIN TOTAL: 0.3 mg/dL (ref 0.3–1.2)
BUN: 14 mg/dL (ref 6–23)
CO2: 29 mEq/L (ref 19–32)
Calcium: 9.5 mg/dL (ref 8.4–10.5)
Chloride: 102 mEq/L (ref 96–112)
Creatinine, Ser: 0.7 mg/dL (ref 0.4–1.2)
GFR: 113.02 mL/min (ref >60.00)
GLUCOSE: 98 mg/dL (ref 70–99)
POTASSIUM: 3.7 meq/L (ref 3.5–5.1)
SODIUM: 140 meq/L (ref 135–145)
TOTAL PROTEIN: 7.5 g/dL (ref 6.0–8.3)

## 2013-04-10 LAB — URIC ACID: URIC ACID, SERUM: 6.6 mg/dL (ref 2.4–7.0)

## 2013-04-10 LAB — HEMOGLOBIN A1C: HEMOGLOBIN A1C: 6.5 % (ref 4.6–6.5)

## 2013-04-10 MED ORDER — TRANDOLAPRIL-VERAPAMIL HCL ER 4-240 MG PO TBCR: EXTENDED_RELEASE_TABLET | ORAL | Status: DC

## 2013-04-10 MED ORDER — METFORMIN HCL 500 MG PO TABS: ORAL_TABLET | ORAL | Status: DC

## 2013-04-10 MED ORDER — FUROSEMIDE 20 MG PO TABS: ORAL_TABLET | ORAL | Status: DC

## 2013-04-10 NOTE — Progress Notes (Signed)
Pre visit review using our clinic review tool, if applicable. No additional management support is needed unless otherwise documented below in the visit note. 

## 2013-04-10 NOTE — Progress Notes (Signed)
Subjective:    Patient ID: Haley Wilcox, female    DOB: [birth year], 80 y.o.   MRN: [id number]  HPI  Did not take the lasix and the blood pressure was elevated this AM Stable no HA of chest pain Monitoring due to renal and A1C Stable CBg's History of iron deficiency history of hypokalemia associated with diverticulitis  Review of Systems  Constitutional: Negative.   Eyes: Negative.   Respiratory: Negative.   Cardiovascular: Negative.   Gastrointestinal: Positive for constipation.  Musculoskeletal: Negative.   Skin: Negative.   Psychiatric/Behavioral: Negative.    Past Medical History  Diagnosis Date  . Gout   . Hypertension   . Diabetes mellitus   . Hyperlipidemia     History   Social History  . Marital Status: Married    Spouse Name: N/A    Number of Children: N/A  . Years of Education: N/A   Occupational History  . Not on file.   Social History Main Topics  . Smoking status: Never Smoker   . Smokeless tobacco: Never Used  . Alcohol Use: No  . Drug Use: No  . Sexual Activity: Yes   Other Topics Concern  . Not on file   Social History Narrative  . No narrative on file    Past Surgical History  Procedure Laterality Date  . Abdominal hysterectomy    . D &c    . Dilation and curettage of uterus    . Tubal ligation    . Eye surgery      bilateral cataract    Family History  Problem Relation Age of Onset  . Hypertension Mother     Allergies  Allergen Reactions  . Fluoride Preparations   . Ivp Dye [Iodinated Diagnostic Agents]     Current Outpatient Prescriptions on File Prior to Visit  Medication Sig Dispense Refill  . mometasone (ELOCON) 0.1 % cream Apply topically as needed. Use on affected area bid  45 g  3  . omeprazole (PRILOSEC) 20 MG capsule Take 1 capsule (20 mg total) by mouth daily.  30 capsule  3  . polyethylene glycol powder (GLYCOLAX/MIRALAX) powder Mix 17gm in 4oz of water.  Start with 2 doses per day, but may take up to  6 doses per day.  Titrate number of doses to allow 2-3 soft bowel movements daily.  850 g  1   No current facility-administered medications on file prior to visit.    BP 150/74  Pulse 76  Temp(Src) 98.2 F (36.8 C)  Resp 16  Ht 5\' 2"  (1.575 m)  Wt 173 lb (78.472 kg)  BMI 31.63 kg/m2       Objective:   Physical Exam  Constitutional: She is oriented to person, place, and time. She appears well-developed.  HENT:  Head: Normocephalic and atraumatic.  Eyes: Conjunctivae are normal. Pupils are equal, round, and reactive to light.  Neck: Normal range of motion. Neck supple.  Cardiovascular: Normal rate and regular rhythm.   Murmur heard. Pulmonary/Chest: Effort normal and breath sounds normal.  Abdominal: Soft. Bowel sounds are normal. She exhibits no distension. There is no tenderness.  Neurological: She is alert and oriented to person, place, and time.  Skin: Skin is warm and dry.          Assessment & Plan:  Patient is on furosemide and we will monitor her potassium as well as her renal function she did have hypokalemia this summer after a bout of diverticulitis.  She has  mild adult-onset diabetes on metformin at a minimal dose we'll measure hemoglobin A1c.  She has a history of iron deficiency anemia and we will check a CBC she also has a history of hyperlipidemia we'll check a lipid profile.  She's had gout involving her left knee but has had no recurrence of symptomology a uric acid will be checked since she is no longer on I appear normal

## 2013-04-11 ENCOUNTER — Telehealth: Payer: Self-pay

## 2013-04-11 NOTE — Telephone Encounter (Signed)
Relevant patient education assigned to patient using Emmi. ° °

## 2013-04-17 ENCOUNTER — Other Ambulatory Visit: Payer: Self-pay | Admitting: Internal Medicine

## 2013-04-26 ENCOUNTER — Telehealth: Payer: Self-pay | Admitting: Internal Medicine

## 2013-04-26 NOTE — Telephone Encounter (Signed)
Relevant patient education mailed to patient.  

## 2013-10-09 ENCOUNTER — Ambulatory Visit: Payer: Medicare Other | Admitting: Internal Medicine

## 2013-10-17 ENCOUNTER — Ambulatory Visit: Payer: Medicare Other | Admitting: Family

## 2013-10-17 ENCOUNTER — Ambulatory Visit (INDEPENDENT_AMBULATORY_CARE_PROVIDER_SITE_OTHER): Payer: Medicare Other | Admitting: Family

## 2013-10-17 ENCOUNTER — Encounter: Payer: Self-pay | Admitting: Family

## 2013-10-17 VITALS — BP 132/70 | HR 74 | Ht 62.0 in | Wt 177.0 lb

## 2013-10-17 DIAGNOSIS — E119 Type 2 diabetes mellitus without complications: Secondary | ICD-10-CM

## 2013-10-17 DIAGNOSIS — K219 Gastro-esophageal reflux disease without esophagitis: Secondary | ICD-10-CM | POA: Diagnosis not present

## 2013-10-17 DIAGNOSIS — I10 Essential (primary) hypertension: Secondary | ICD-10-CM

## 2013-10-17 LAB — MICROALBUMIN / CREATININE URINE RATIO
CREATININE, U: 119.5 mg/dL
MICROALB UR: 51.9 mg/dL — AB (ref 0.0–1.9)
MICROALB/CREAT RATIO: 43.4 mg/g — AB (ref 0.0–30.0)

## 2013-10-17 LAB — BASIC METABOLIC PANEL
BUN: 18 mg/dL (ref 6–23)
CALCIUM: 9.6 mg/dL (ref 8.4–10.5)
CHLORIDE: 102 meq/L (ref 96–112)
CO2: 30 meq/L (ref 19–32)
CREATININE: 0.7 mg/dL (ref 0.4–1.2)
GFR: 98.72 mL/min (ref >60.00)
Glucose, Bld: 91 mg/dL (ref 70–99)
Potassium: 3.6 mEq/L (ref 3.5–5.1)
Sodium: 139 mEq/L (ref 135–145)

## 2013-10-17 LAB — HEPATIC FUNCTION PANEL
ALBUMIN: 4 g/dL (ref 3.5–5.2)
ALT: 15 U/L (ref 0–35)
AST: 20 U/L (ref 0–37)
Alkaline Phosphatase: 73 U/L (ref 39–117)
Bilirubin, Direct: 0 mg/dL (ref 0.0–0.3)
TOTAL PROTEIN: 7.2 g/dL (ref 6.0–8.3)
Total Bilirubin: 0.4 mg/dL (ref 0.2–1.2)

## 2013-10-17 LAB — HEMOGLOBIN A1C: Hgb A1c MFr Bld: 6.2 % (ref 4.6–6.5)

## 2013-10-17 NOTE — Progress Notes (Signed)
Subjective:    Patient ID: Haley Wilcox, female    DOB: [birth year], 80 y.o.   MRN: [id number]  HPI  80 year old, female, nonsmoker, presents today for a recheck of type 2 diabetes, hypertension, hyperlipidemia, and gout. She's currently stable on medication. Reports she discontinued taking MiraLax due to diarrhea. She's been taking Colace and that works well. She walks daily.  Review of Systems  Constitutional: Negative.   HENT: Negative.   Eyes: Negative for photophobia and visual disturbance.  Respiratory: Negative.   Cardiovascular: Negative.   Gastrointestinal: Negative.   Endocrine: Negative.  Negative for polydipsia and polyphagia.  Genitourinary: Negative.   Musculoskeletal: Negative.   Skin: Negative.   Allergic/Immunologic: Negative.   Neurological: Negative.   Psychiatric/Behavioral: Negative.    Past Medical History  Diagnosis Date  . Gout   . Hypertension   . Diabetes mellitus   . Hyperlipidemia     History   Social History  . Marital Status: Married    Spouse Name: N/A    Number of Children: N/A  . Years of Education: N/A   Occupational History  . Not on file.   Social History Main Topics  . Smoking status: Never Smoker   . Smokeless tobacco: Never Used  . Alcohol Use: No  . Drug Use: No  . Sexual Activity: Yes   Other Topics Concern  . Not on file   Social History Narrative  . No narrative on file    Past Surgical History  Procedure Laterality Date  . Abdominal hysterectomy    . D &c    . Dilation and curettage of uterus    . Tubal ligation    . Eye surgery      bilateral cataract    Family History  Problem Relation Age of Onset  . Hypertension Mother     Allergies  Allergen Reactions  . Fluoride Preparations   . Ivp Dye [Iodinated Diagnostic Agents]     Current Outpatient Prescriptions on File Prior to Visit  Medication Sig Dispense Refill  . furosemide (LASIX) 20 MG tablet TAKE 1 TABLET (20MG  TOTAL) BY MOUTH DAILY   90 tablet  3  . metFORMIN (GLUCOPHAGE) 500 MG tablet TAKE 1 TABLET (500MG  TOTAL) BY MOUTH DAILY WITH BREAKFAST  90 tablet  3  . mometasone (ELOCON) 0.1 % cream Apply topically as needed. Use on affected area bid  45 g  3  . omeprazole (PRILOSEC) 20 MG capsule Take 1 capsule (20 mg total) by mouth daily.  30 capsule  3  . polyethylene glycol powder (GLYCOLAX/MIRALAX) powder Mix 17gm in 4oz of water.  Start with 2 doses per day, but may take up to 6 doses per day.  Titrate number of doses to allow 2-3 soft bowel movements daily.  850 g  1  . TARKA 4-240 MG per tablet TAKE 1 TABLET TWICE A DAY  180 tablet  3  . trandolapril-verapamil (TARKA) 4-240 MG per tablet TAKE 1 TABLET TWICE A DAY  180 tablet  3   No current facility-administered medications on file prior to visit.    BP 132/70  Pulse 74  Ht 5\' 2"  (1.575 m)  Wt 177 lb (80.287 kg)  BMI 32.37 kg/m2chart    Objective:   Physical Exam  Constitutional: She is oriented to person, place, and time. She appears well-developed and well-nourished.  HENT:  Right Ear: External ear normal.  Left Ear: External ear normal.  Nose: Nose normal.  Mouth/Throat: Oropharynx is clear  and moist.  Neck: Normal range of motion. Neck supple. No thyromegaly present.  Cardiovascular: Normal rate, regular rhythm and normal heart sounds.   Pulmonary/Chest: Effort normal and breath sounds normal.  Abdominal: Soft. Bowel sounds are normal.  Neurological: She is alert and oriented to person, place, and time.  Skin: Skin is warm and dry.  Psychiatric: She has a normal mood and affect.          Assessment & Plan:  Haley Wilcox was seen today for hypertension.  Diagnoses and associated orders for this visit:  Unspecified essential hypertension - Basic Metabolic Panel - Hepatic Function Panel  Gastroesophageal reflux disease without esophagitis  Type II or unspecified type diabetes mellitus without mention of complication, not stated as  uncontrolled - Hemoglobin Q8G - Basic Metabolic Panel - Microalbumin/Creatinine Ratio, Urine    call the office with any questions or concerns. Recheck in 6 months and sooner as needed.

## 2013-10-17 NOTE — Patient Instructions (Signed)
Diabetes and Exercise Exercising regularly is important. It is not just about losing weight. It has many health benefits, such as:  Improving your overall fitness, flexibility, and endurance.  Increasing your bone density.  Helping with weight control.  Decreasing your body fat.  Increasing your muscle strength.  Reducing stress and tension.  Improving your overall health. People with diabetes who exercise gain additional benefits because exercise:  Reduces appetite.  Improves the body's use of blood sugar (glucose).  Helps lower or control blood glucose.  Decreases blood pressure.  Helps control blood lipids (such as cholesterol and triglycerides).  Improves the body's use of the hormone insulin by:  Increasing the body's insulin sensitivity.  Reducing the body's insulin needs.  Decreases the risk for heart disease because exercising:  Lowers cholesterol and triglycerides levels.  Increases the levels of good cholesterol (such as high-density lipoproteins [HDL]) in the body.  Lowers blood glucose levels. YOUR ACTIVITY PLAN  Choose an activity that you enjoy and set realistic goals. Your health care provider or diabetes educator can help you make an activity plan that works for you. Exercise regularly as directed by your health care provider. This includes:  Performing resistance training twice a week such as push-ups, sit-ups, lifting weights, or using resistance bands.  Performing 150 minutes of cardio exercises each week such as walking, running, or playing sports.  Staying active and spending no more than 90 minutes at one time being inactive. Even short bursts of exercise are good for you. Three 10-minute sessions spread throughout the day are just as beneficial as a single 30-minute session. Some exercise ideas include:  Taking the dog for a walk.  Taking the stairs instead of the elevator.  Dancing to your favorite song.  Doing an exercise  video.  Doing your favorite exercise with a friend. RECOMMENDATIONS FOR EXERCISING WITH TYPE 1 OR TYPE 2 DIABETES   Check your blood glucose before exercising. If blood glucose levels are greater than 240 mg/dL, check for urine ketones. Do not exercise if ketones are present.  Avoid injecting insulin into areas of the body that are going to be exercised. For example, avoid injecting insulin into:  The arms when playing tennis.  The legs when jogging.  Keep a record of:  Food intake before and after you exercise.  Expected peak times of insulin action.  Blood glucose levels before and after you exercise.  The type and amount of exercise you have done.  Review your records with your health care provider. Your health care provider will help you to develop guidelines for adjusting food intake and insulin amounts before and after exercising.  If you take insulin or oral hypoglycemic agents, watch for signs and symptoms of hypoglycemia. They include:  Dizziness.  Shaking.  Sweating.  Chills.  Confusion.  Drink plenty of water while you exercise to prevent dehydration or heat stroke. Body water is lost during exercise and must be replaced.  Talk to your health care provider before starting an exercise program to make sure it is safe for you. Remember, almost any type of activity is better than none. Document Released: 05/30/2003 Document Revised: 07/24/2013 Document Reviewed: 08/16/2012 [employer] Patient Information 2015 [employer]. This information is not intended to replace advice given to you by your health care provider. Make sure you discuss any questions you have with your health care provider.  

## 2013-10-17 NOTE — Progress Notes (Signed)
Pre visit review using our clinic review tool, if applicable. No additional management support is needed unless otherwise documented below in the visit note. 

## 2014-03-06 ENCOUNTER — Other Ambulatory Visit: Payer: Self-pay | Admitting: *Deleted

## 2014-03-06 MED ORDER — TRANDOLAPRIL-VERAPAMIL HCL ER 4-240 MG PO TBCR: 1.0000 | EXTENDED_RELEASE_TABLET | Freq: Two times a day (BID) | ORAL | Status: DC

## 2014-03-30 ENCOUNTER — Other Ambulatory Visit: Payer: Self-pay | Admitting: *Deleted

## 2014-03-30 MED ORDER — METFORMIN HCL 500 MG PO TABS: ORAL_TABLET | ORAL | Status: DC

## 2014-03-30 MED ORDER — FUROSEMIDE 20 MG PO TABS: ORAL_TABLET | ORAL | Status: DC

## 2014-04-16 ENCOUNTER — Encounter: Payer: Self-pay | Admitting: Family

## 2014-04-16 ENCOUNTER — Ambulatory Visit (INDEPENDENT_AMBULATORY_CARE_PROVIDER_SITE_OTHER): Payer: Medicare Other | Admitting: Family

## 2014-04-16 VITALS — BP 180/98 | HR 79 | Temp 98.6°F | Ht 63.0 in | Wt 173.6 lb

## 2014-04-16 DIAGNOSIS — Z23 Encounter for immunization: Secondary | ICD-10-CM | POA: Diagnosis not present

## 2014-04-16 DIAGNOSIS — K219 Gastro-esophageal reflux disease without esophagitis: Secondary | ICD-10-CM

## 2014-04-16 DIAGNOSIS — E785 Hyperlipidemia, unspecified: Secondary | ICD-10-CM | POA: Diagnosis not present

## 2014-04-16 DIAGNOSIS — I10 Essential (primary) hypertension: Secondary | ICD-10-CM

## 2014-04-16 DIAGNOSIS — E119 Type 2 diabetes mellitus without complications: Secondary | ICD-10-CM

## 2014-04-16 LAB — BASIC METABOLIC PANEL
BUN: 16 mg/dL (ref 6–23)
CHLORIDE: 104 meq/L (ref 96–112)
CO2: 28 mEq/L (ref 19–32)
Calcium: 9.9 mg/dL (ref 8.4–10.5)
Creatinine, Ser: 0.64 mg/dL (ref 0.40–1.20)
GFR: 114.76 mL/min (ref >60.00)
Glucose, Bld: 101 mg/dL — ABNORMAL HIGH (ref 70–99)
Potassium: 3.8 mEq/L (ref 3.5–5.1)
Sodium: 140 mEq/L (ref 135–145)

## 2014-04-16 LAB — HEPATIC FUNCTION PANEL
ALT: 9 U/L (ref 0–35)
AST: 15 U/L (ref 0–37)
Albumin: 4 g/dL (ref 3.5–5.2)
Alkaline Phosphatase: 78 U/L (ref 39–117)
Bilirubin, Direct: 0.1 mg/dL (ref 0.0–0.3)
TOTAL PROTEIN: 7.1 g/dL (ref 6.0–8.3)
Total Bilirubin: 0.3 mg/dL (ref 0.2–1.2)

## 2014-04-16 LAB — LIPID PANEL
CHOLESTEROL: 228 mg/dL — AB (ref 0–200)
HDL: 59.6 mg/dL (ref >39.00)
LDL CALC: 139 mg/dL — AB (ref 0–99)
NonHDL: 168.4
Total CHOL/HDL Ratio: 4
Triglycerides: 145 mg/dL (ref 0.0–149.0)
VLDL: 29 mg/dL (ref 0.0–40.0)

## 2014-04-16 LAB — HEMOGLOBIN A1C: Hgb A1c MFr Bld: 6.5 % (ref 4.6–6.5)

## 2014-04-16 MED ORDER — OMEPRAZOLE 20 MG PO CPDR: 20.0000 mg | DELAYED_RELEASE_CAPSULE | Freq: Every day | ORAL | Status: DC

## 2014-04-16 MED ORDER — AMLODIPINE BESY-BENAZEPRIL HCL 5-40 MG PO CAPS: 1.0000 | ORAL_CAPSULE | Freq: Every day | ORAL | Status: DC

## 2014-04-16 NOTE — Progress Notes (Signed)
Pre visit review using our clinic review tool, if applicable. No additional management support is needed unless otherwise documented below in the visit note. 

## 2014-04-16 NOTE — Progress Notes (Signed)
Subjective:    Patient ID: Haley Wilcox, female    DOB: June 29, 1931, 81 y.o.   MRN: [id number]  HPI  81 year old African-American female, nonsmoker with a history of type 2 diabetes, hypertension, hyperlipidemia, and chronic constipation is in today for recheck. Does not routinely check her blood glucose that was unsure of how her readings have been over the last 6 months. Has not had a diabetic eye exam or influenza vaccine. Tolerates medications well. Reports walking daily as her source of exercise.   Review of Systems  Constitutional: Negative.   HENT: Negative.   Eyes: Negative.   Respiratory: Negative.   Cardiovascular: Negative.   Gastrointestinal: Negative.   Endocrine: Negative.   Genitourinary: Negative.   Musculoskeletal: Negative.   Skin: Negative.   Allergic/Immunologic: Negative.   Neurological: Negative.   Hematological: Negative.   Psychiatric/Behavioral: Positive for dysphoric mood.   Past Medical History  Diagnosis Date  . Gout   . Hypertension   . Diabetes mellitus   . Hyperlipidemia     History   Social History  . Marital Status: Married    Spouse Name: N/A    Number of Children: N/A  . Years of Education: N/A   Occupational History  . Not on file.   Social History Main Topics  . Smoking status: Never Smoker   . Smokeless tobacco: Never Used  . Alcohol Use: No  . Drug Use: No  . Sexual Activity: Yes   Other Topics Concern  . Not on file   Social History Narrative    Past Surgical History  Procedure Laterality Date  . Abdominal hysterectomy    . D &c    . Dilation and curettage of uterus    . Tubal ligation    . Eye surgery      bilateral cataract    Family History  Problem Relation Age of Onset  . Hypertension Mother     Allergies  Allergen Reactions  . Fluoride Preparations   . Ivp Dye [Iodinated Diagnostic Agents]     Current Outpatient Prescriptions on File Prior to Visit  Medication Sig Dispense Refill  .  furosemide (LASIX) 20 MG tablet TAKE 1 TABLET (20MG  TOTAL) BY MOUTH DAILY 90 tablet 1  . metFORMIN (GLUCOPHAGE) 500 MG tablet TAKE 1 TABLET (500MG  TOTAL) BY MOUTH DAILY WITH BREAKFAST 90 tablet 1  . mometasone (ELOCON) 0.1 % cream Apply topically as needed. Use on affected area bid 45 g 3  . polyethylene glycol powder (GLYCOLAX/MIRALAX) powder Mix 17gm in 4oz of water.  Start with 2 doses per day, but may take up to 6 doses per day.  Titrate number of doses to allow 2-3 soft bowel movements daily. 850 g 1   No current facility-administered medications on file prior to visit.    BP 180/98 mmHg  Pulse 79  Temp(Src) 98.6 F (37 C) (Oral)  Ht 5\' 3"  (1.6 m)  Wt 173 lb 9.6 oz (78.744 kg)  BMI 30.76 kg/m2chart    Objective:   Physical Exam  Constitutional: She is oriented to person, place, and time. She appears well-developed and well-nourished.  HENT:  Right Ear: External ear normal.  Left Ear: External ear normal.  Nose: Nose normal.  Mouth/Throat: Oropharynx is clear and moist.  Neck: Normal range of motion. Neck supple. No thyromegaly present.  Cardiovascular: Normal rate, regular rhythm and normal heart sounds.   Pulmonary/Chest: Effort normal and breath sounds normal.  Abdominal: Soft. Bowel sounds are normal.  Musculoskeletal:  Normal range of motion.  Neurological: She is alert and oriented to person, place, and time.  Skin: Skin is warm and dry.  Psychiatric: She has a normal mood and affect.          Assessment & Plan:  Haley Wilcox was seen today for follow-up.  Diagnoses and associated orders for this visit:  Type 2 diabetes mellitus without complication - Hemoglobin O4C - Basic Metabolic Panel - Hepatic Function Panel - Lipid Panel  Hypertension, uncontrolled - Basic Metabolic Panel - Hepatic Function Panel - Lipid Panel  Gastroesophageal reflux disease without esophagitis  Hyperlipemia - Basic Metabolic Panel - Hepatic Function Panel - Lipid  Panel  Encounter for immunization  Other Orders - Discontinue: amLODipine-benazepril (LOTREL) 5-40 MG per capsule; Take 1 capsule by mouth daily. - Discontinue: omeprazole (PRILOSEC) 20 MG capsule; Take 1 capsule (20 mg total) by mouth daily. - amLODipine-benazepril (LOTREL) 5-40 MG per capsule; Take 1 capsule by mouth daily. - omeprazole (PRILOSEC) 20 MG capsule; Take 1 capsule (20 mg total) by mouth daily. - Flu vaccine HIGH DOSE PF    Blood pressure significantly elevated today discontinue Tarka and start Lotrel 5/40 once daily. Recheck in 2-3 weeks. Encouraged diabetic eye exam. Continue exercising. Call the office with any questions or concerns.

## 2014-04-16 NOTE — Patient Instructions (Signed)
Diabetes and Standards of Medical Care Diabetes is complicated. You may find that your diabetes team includes a dietitian, nurse, diabetes educator, eye doctor, and more. To help everyone know what is going on and to help you get the care you deserve, the following schedule of care was developed to help keep you on track. Below are the tests, exams, vaccines, medicines, education, and plans you will need. HbA1c test This test shows how well you have controlled your glucose over the past 2-3 months. It is used to see if your diabetes management plan needs to be adjusted.   It is performed at least 2 times a year if you are meeting treatment goals.  It is performed 4 times a year if therapy has changed or if you are not meeting treatment goals. Blood pressure test  This test is performed at every routine medical visit. The goal is less than 140/90 mm Hg for most people, but 130/80 mm Hg in some cases. Ask your health care provider about your goal. Dental exam  Follow up with the dentist regularly. Eye exam  If you are diagnosed with type 1 diabetes as a child, get an exam upon reaching the age of 37 years or older and have had diabetes for 3-5 years. Yearly eye exams are recommended after that initial eye exam.  If you are diagnosed with type 1 diabetes as an adult, get an exam within 5 years of diagnosis and then yearly.  If you are diagnosed with type 2 diabetes, get an exam as soon as possible after the diagnosis and then yearly. Foot care exam  Visual foot exams are performed at every routine medical visit. The exams check for cuts, injuries, or other problems with the feet.  A comprehensive foot exam should be done yearly. This includes visual inspection as well as assessing foot pulses and testing for loss of sensation.  Check your feet nightly for cuts, injuries, or other problems with your feet. Tell your health care provider if anything is not healing. Kidney function test (urine  microalbumin)  This test is performed once a year.  Type 1 diabetes: The first test is performed 5 years after diagnosis.  Type 2 diabetes: The first test is performed at the time of diagnosis.  A serum creatinine and estimated glomerular filtration rate (eGFR) test is done once a year to assess the level of chronic kidney disease (CKD), if present. Lipid profile (cholesterol, HDL, LDL, triglycerides)  Performed every 5 years for most people.  The goal for LDL is less than 100 mg/dL. If you are at high risk, the goal is less than 70 mg/dL.  The goal for HDL is 40 mg/dL-50 mg/dL for men and 50 mg/dL-60 mg/dL for women. An HDL cholesterol of 60 mg/dL or higher gives some protection against heart disease.  The goal for triglycerides is less than 150 mg/dL. Influenza vaccine, pneumococcal vaccine, and hepatitis B vaccine  The influenza vaccine is recommended yearly.  It is recommended that people with diabetes who are over 24 years old get the pneumonia vaccine. In some cases, two separate shots may be given. Ask your health care provider if your pneumonia vaccination is up to date.  The hepatitis B vaccine is also recommended for adults with diabetes. Diabetes self-management education  Education is recommended at diagnosis and ongoing as needed. Treatment plan  Your treatment plan is reviewed at every medical visit. Document Released: 01/04/2009 Document Revised: 07/24/2013 Document Reviewed: 08/09/2012 [facility] Patient Information 2015 [place]. This information is not intended to replace advice given to you by your health care provider. Make sure you discuss any questions you have with your health care provider.  

## 2014-04-17 ENCOUNTER — Telehealth: Payer: Self-pay | Admitting: Internal Medicine

## 2014-04-17 NOTE — Telephone Encounter (Signed)
emmi emailed °

## 2014-04-19 ENCOUNTER — Ambulatory Visit: Payer: Medicare Other | Admitting: Family

## 2014-04-20 DIAGNOSIS — Z124 Encounter for screening for malignant neoplasm of cervix: Secondary | ICD-10-CM | POA: Diagnosis not present

## 2014-04-20 DIAGNOSIS — Z01419 Encounter for gynecological examination (general) (routine) without abnormal findings: Secondary | ICD-10-CM | POA: Diagnosis not present

## 2014-05-08 ENCOUNTER — Ambulatory Visit (INDEPENDENT_AMBULATORY_CARE_PROVIDER_SITE_OTHER): Payer: Medicare Other | Admitting: Family Medicine

## 2014-05-08 ENCOUNTER — Encounter: Payer: Self-pay | Admitting: Family Medicine

## 2014-05-08 VITALS — BP 160/84 | Temp 98.7°F | Wt 173.0 lb

## 2014-05-08 DIAGNOSIS — I10 Essential (primary) hypertension: Secondary | ICD-10-CM

## 2014-05-08 MED ORDER — AMLODIPINE BESY-BENAZEPRIL HCL 5-40 MG PO CAPS: 1.0000 | ORAL_CAPSULE | Freq: Two times a day (BID) | ORAL | Status: DC

## 2014-05-08 NOTE — Assessment & Plan Note (Addendum)
Still poor control despite recent change from tarka (verapamil and trandalopril) to amlodipine-benazepril at 5-40mg  by Dr. Megan Salon. We will increase medication to BID and follow up within 1-2 weeks. Asymptomatic fortunately.   Also on lasix for history of edema. With gout history and unclear indication (is this simply venous insufficiency needing compression stockings?), consider D/c in future

## 2014-05-08 NOTE — Patient Instructions (Signed)
Start taking lotrel twice a day  Follow up with me within 1-2 weeks. Can use SDA slot if needed.

## 2014-05-08 NOTE — Progress Notes (Signed)
  Garret Reddish, MD Phone: [phone]  Subjective:   Haley Wilcox is a 81 y.o. year old very pleasant female patient who presents with the following:  Hypertension-Still poor control despite recent change from tarka (verapamil and trandalopril) to amlodipine-benazepril at 5-40mg  by Dr. Megan Salon. Also takes lasix for history or edema BP Readings from Last 3 Encounters:  05/08/14 160/84  04/16/14 180/98  10/17/13 132/70  Home BP monitoring-yes in last week varying from 142-168 probably averagin low 150s. Diastolic range from 20-10. This is perhaps a slight improvement from previous values.  Compliant with medications-yes without side effects ROS-Denies any CP, HA, SOB, blurry vision, LE edema  Past Medical History- HLD, gout, anemia, DM II  Medications- reviewed and updated Current Outpatient Prescriptions  Medication Sig Dispense Refill  . amLODipine-benazepril (LOTREL) 5-40 MG per capsule Take 1 capsule by mouth daily. 30 capsule 3  . furosemide (LASIX) 20 MG tablet TAKE 1 TABLET (20MG  TOTAL) BY MOUTH DAILY 90 tablet 1  . metFORMIN (GLUCOPHAGE) 500 MG tablet TAKE 1 TABLET (500MG  TOTAL) BY MOUTH DAILY WITH BREAKFAST 90 tablet 1  . omeprazole (PRILOSEC) 20 MG capsule Take 1 capsule (20 mg total) by mouth daily. 90 capsule 1  . mometasone (ELOCON) 0.1 % cream Apply topically as needed. Use on affected area bid (Patient not taking: Reported on 05/08/2014) 45 g 3  . polyethylene glycol powder (GLYCOLAX/MIRALAX) powder Mix 17gm in 4oz of water.  Start with 2 doses per day, but may take up to 6 doses per day.  Titrate number of doses to allow 2-3 soft bowel movements daily. (Patient not taking: Reported on 05/08/2014) 850 g 1   No current facility-administered medications for this visit.    Objective: BP 160/84 mmHg  Temp(Src) 98.7 F (37.1 C)  Wt 173 lb (78.472 kg) Gen: NAD, resting comfortably PERRLA CV: RRR no murmurs rubs or gallops Lungs: CTAB no crackles, wheeze,  rhonchi Abdomen: soft/nontender/nondistended/normal bowel sounds.  Ext: no edema Skin: warm, dry, no rash  Assessment/Plan:  Essential hypertension Still poor control despite recent change from tarka (verapamil and trandalopril) to amlodipine-benazepril at 5-40mg  by Dr. Megan Salon. We will increase medication to BID and follow up within 1-2 weeks. Asymptomatic fortunately.    Return precautions advised. 1-2 weeks planned. [facility] if becomes controlled  Meds ordered this encounter  Medications  . amLODipine-benazepril (LOTREL) 5-40 MG per capsule    Sig: Take 1 capsule by mouth 2 (two) times daily.    Dispense:  60 capsule    Refill:  5

## 2014-05-23 ENCOUNTER — Encounter: Payer: Self-pay | Admitting: Family Medicine

## 2014-05-23 ENCOUNTER — Ambulatory Visit (INDEPENDENT_AMBULATORY_CARE_PROVIDER_SITE_OTHER): Payer: Medicare Other | Admitting: Family Medicine

## 2014-05-23 VITALS — BP 152/82 | HR 97 | Temp 98.6°F | Wt 174.0 lb

## 2014-05-23 DIAGNOSIS — I10 Essential (primary) hypertension: Secondary | ICD-10-CM | POA: Diagnosis not present

## 2014-05-23 NOTE — Progress Notes (Signed)
  Garret Reddish, MD Phone: [phone]  Subjective:   Haley Wilcox is a 81 y.o. year old very pleasant female patient who presents with the following:  Hypertension-mild poor control in office, good control at home  BP Readings from Last 3 Encounters:  05/23/14 152/82  05/08/14 160/84  04/16/14 180/98  Recent change from verapamil and trandalopril to amlodipine-benazepril 5-40mg  by Dr. Megan Salon. Plan was for BID dosing and f/u 2 weeks which is today Home BP monitoring-over last 3 days systolic has ranged from 485-462 and diastolic from 70-35. Compliant with medications-yes with side effects of frontal headache in first few days now resolved ROS-Denies any CP,  SOB, blurry vision, LE edema.  Past Medical History- Patient Active Problem List   Diagnosis Date Noted  . Diabetes mellitus type II, controlled 11/23/2006    Priority: High  . Idiopathic gout of left knee 06/06/2012    Priority: Medium  . Hyperlipidemia 10/23/2010    Priority: Medium  . Essential hypertension 10/13/2006    Priority: Medium  . Iron deficiency anemia 06/03/2012    Priority: Low  . Sinus tachycardia 06/02/2012    Priority: Low   Medications- reviewed and updated Current Outpatient Prescriptions  Medication Sig Dispense Refill  . amLODipine-benazepril (LOTREL) 5-40 MG per capsule Take 1 capsule by mouth 2 (two) times daily. 60 capsule 5  . furosemide (LASIX) 20 MG tablet TAKE 1 TABLET (20MG  TOTAL) BY MOUTH DAILY 90 tablet 1  . metFORMIN (GLUCOPHAGE) 500 MG tablet TAKE 1 TABLET (500MG  TOTAL) BY MOUTH DAILY WITH BREAKFAST 90 tablet 1  . mometasone (ELOCON) 0.1 % cream Apply topically as needed. Use on affected area bid 45 g 3  . omeprazole (PRILOSEC) 20 MG capsule Take 1 capsule (20 mg total) by mouth daily. 90 capsule 1  . polyethylene glycol powder (GLYCOLAX/MIRALAX) powder Mix 17gm in 4oz of water.  Start with 2 doses per day, but may take up to 6 doses per day.  Titrate number of doses to allow  2-3 soft bowel movements daily. 850 g 1   No current facility-administered medications for this visit.   Objective: BP 152/82 mmHg  Pulse 97  Temp(Src) 98.6 F (37 C) (Oral)  Wt 174 lb (78.926 kg)  SpO2 99% Gen: NAD, resting comfortably CV: RRR no murmurs rubs or gallops Lungs: CTAB no crackles, wheeze, rhonchi Abdomen: soft/nontender/nondistended/normal bowel sounds.   Ext: no edema Skin: warm, dry, no rash Neuro: grossly normal, moves all extremities, normal gait   Assessment/Plan:  Essential hypertension Poor control in office today with goal systolic at least between 140-150 though could push lower with diabetes. Will continue Benazepril 5-40 mg BID. Follow up 3 months. Considered adding HCTZ but history of gout. Consider beta blocker at follow up definitely if in office gain over 150 and home cuff does not verify-she is to bring it. Advised dash diet as well.    3 month follow up or sooner if home BPs above 150.

## 2014-05-23 NOTE — Assessment & Plan Note (Addendum)
Poor control in office today with goal systolic at least between 140-150 though could push lower with diabetes. Will continue Benazepril 5-40 mg BID. Follow up 3 months. Considered adding HCTZ but history of gout. Consider beta blocker at follow up definitely if in office gain over 150 and home cuff does not verify-she is to bring it. Advised dash diet as well.

## 2014-05-23 NOTE — Patient Instructions (Addendum)
Blood pressures at home over last 5 days have looked great. Please continue to monitor at least once a day. If you are regularly above 150, come see me sooner, but if not, let's just check in 3 months from now. Bring your cuff with you next time.   Sign release of information at the front desk for [facility] for a copy of your bone density.   Please get an updated diabetic eye exam and have them send [place] a copy.   DASH Eating Plan DASH stands for "Dietary Approaches to Stop Hypertension." The DASH eating plan is a healthy eating plan that has been shown to reduce high blood pressure (hypertension). Additional health benefits may include reducing the risk of type 2 diabetes mellitus, heart disease, and stroke. The DASH eating plan may also help with weight loss. WHAT DO I NEED TO KNOW ABOUT THE DASH EATING PLAN? For the DASH eating plan, you will follow these general guidelines:  Choose foods with a percent daily value for sodium of less than 5% (as listed on the food label).  Use salt-free seasonings or herbs instead of table salt or sea salt.  Check with your health care provider or pharmacist before using salt substitutes.  Eat lower-sodium products, often labeled as "lower sodium" or "no salt added."  Eat fresh foods.  Eat more vegetables, fruits, and low-fat dairy products.  Choose whole grains. Look for the word "whole" as the first word in the ingredient list.  Choose fish and skinless chicken or [place] more often than red meat. Limit fish, poultry, and meat to 6 oz (170 g) each day.  Limit sweets, desserts, sugars, and sugary drinks.  Choose heart-healthy fats.  Limit cheese to 1 oz (28 g) per day.  Eat more home-cooked food and less restaurant, buffet, and fast food.  Limit fried foods.  Cook foods using methods other than frying.  Limit canned vegetables. If you do use them, rinse them well to decrease the sodium.  When eating at a restaurant, ask that your food  be prepared with less salt, or no salt if possible. WHAT FOODS CAN I EAT? Seek help from a dietitian for individual calorie needs. Grains Whole grain or whole wheat bread. Brown rice. Whole grain or whole wheat pasta. Quinoa, bulgur, and whole grain cereals. Low-sodium cereals. Corn or whole wheat flour tortillas. Whole grain cornbread. Whole grain crackers. Low-sodium crackers. Vegetables Fresh or frozen vegetables (raw, steamed, roasted, or grilled). Low-sodium or reduced-sodium tomato and vegetable juices. Low-sodium or reduced-sodium tomato sauce and paste. Low-sodium or reduced-sodium canned vegetables.  Fruits All fresh, canned (in natural juice), or frozen fruits. Meat and Other Protein Products Ground beef (85% or leaner), grass-fed beef, or beef trimmed of fat. Skinless chicken or [place]. Ground chicken or [place]. Pork trimmed of fat. All fish and seafood. Eggs. Dried beans, peas, or lentils. Unsalted nuts and seeds. Unsalted canned beans. Dairy Low-fat dairy products, such as skim or 1% milk, 2% or reduced-fat cheeses, low-fat ricotta or cottage cheese, or plain low-fat yogurt. Low-sodium or reduced-sodium cheeses. Fats and Oils Tub margarines without trans fats. Light or reduced-fat mayonnaise and salad dressings (reduced sodium). Avocado. Safflower, olive, or canola oils. Natural peanut or almond butter. Other Unsalted popcorn and pretzels. The items listed above may not be a complete list of recommended foods or beverages. Contact your dietitian for more options. WHAT FOODS ARE NOT RECOMMENDED? Grains White bread. White pasta. White rice. Refined cornbread. Bagels and croissants. Crackers that contain  trans fat. Vegetables Creamed or fried vegetables. Vegetables in a cheese sauce. Regular canned vegetables. Regular canned tomato sauce and paste. Regular tomato and vegetable juices. Fruits Dried fruits. Canned fruit in light or heavy syrup. Fruit juice. Meat and Other Protein  Products Fatty cuts of meat. Ribs, chicken wings, bacon, sausage, bologna, salami, chitterlings, fatback, hot dogs, bratwurst, and packaged luncheon meats. Salted nuts and seeds. Canned beans with salt. Dairy Whole or 2% milk, cream, half-and-half, and cream cheese. Whole-fat or sweetened yogurt. Full-fat cheeses or blue cheese. Nondairy creamers and whipped toppings. Processed cheese, cheese spreads, or cheese curds. Condiments Onion and garlic salt, seasoned salt, table salt, and sea salt. Canned and packaged gravies. Worcestershire sauce. Tartar sauce. Barbecue sauce. Teriyaki sauce. Soy sauce, including reduced sodium. Steak sauce. Fish sauce. Oyster sauce. Cocktail sauce. Horseradish. Ketchup and mustard. Meat flavorings and tenderizers. Bouillon cubes. Hot sauce. Tabasco sauce. Marinades. Taco seasonings. Relishes. Fats and Oils Butter, stick margarine, lard, shortening, ghee, and bacon fat. Coconut, palm kernel, or palm oils. Regular salad dressings. Other Pickles and olives. Salted popcorn and pretzels. The items listed above may not be a complete list of foods and beverages to avoid. Contact your dietitian for more information. WHERE CAN I FIND MORE INFORMATION? National Heart, Lung, and Blood Institute: travelstabloid.com Document Released: 02/26/2011 Document Revised: 07/24/2013 Document Reviewed: 01/11/2013 [facility] Patient Information 2015 [place], [place]. This information is not intended to replace advice given to you by your health care provider. Make sure you discuss any questions you have with your health care provider.

## 2014-06-01 ENCOUNTER — Ambulatory Visit (INDEPENDENT_AMBULATORY_CARE_PROVIDER_SITE_OTHER): Payer: Medicare Other | Admitting: Family Medicine

## 2014-06-01 ENCOUNTER — Encounter: Payer: Self-pay | Admitting: Family Medicine

## 2014-06-01 VITALS — BP 110/71 | HR 105 | Temp 102.8°F | Ht 63.0 in | Wt 170.0 lb

## 2014-06-01 DIAGNOSIS — J209 Acute bronchitis, unspecified: Secondary | ICD-10-CM | POA: Diagnosis not present

## 2014-06-01 MED ORDER — HYDROCODONE-HOMATROPINE 5-1.5 MG/5ML PO SYRP: 5.0000 mL | ORAL_SOLUTION | ORAL | Status: DC | PRN

## 2014-06-01 MED ORDER — AZITHROMYCIN 250 MG PO TABS: ORAL_TABLET | ORAL | Status: DC

## 2014-06-01 NOTE — Progress Notes (Signed)
Pre visit review using our clinic review tool, if applicable. No additional management support is needed unless otherwise documented below in the visit note. 

## 2014-06-01 NOTE — Progress Notes (Signed)
   Subjective:    Patient ID: Haley Wilcox, female    DOB: 11/07/1931, 81 y.o.   MRN: [id number]  HPI Here for 3 days of fever, chest tightness and coughing up yellow sputum. On Tylenol.    Review of Systems  Constitutional: Positive for fever and chills.  HENT: Positive for congestion. Negative for postnasal drip and sinus pressure.   Eyes: Negative.   Respiratory: Positive for cough and chest tightness. Negative for shortness of breath.        Objective:   Physical Exam  Constitutional: She appears well-developed and well-nourished.  HENT:  Right Ear: External ear normal.  Left Ear: External ear normal.  Nose: Nose normal.  Mouth/Throat: Oropharynx is clear and moist.  Eyes: Conjunctivae are normal.  Pulmonary/Chest: Effort normal. She has no wheezes. She has no rales.  Scattered rhonchi   Lymphadenopathy:    She has no cervical adenopathy.          Assessment & Plan:  Add Mucinex and fluids

## 2014-07-02 DIAGNOSIS — H40053 Ocular hypertension, bilateral: Secondary | ICD-10-CM | POA: Diagnosis not present

## 2014-07-02 DIAGNOSIS — E119 Type 2 diabetes mellitus without complications: Secondary | ICD-10-CM | POA: Diagnosis not present

## 2014-07-02 LAB — HM DIABETES EYE EXAM

## 2014-07-05 DIAGNOSIS — D485 Neoplasm of uncertain behavior of skin: Secondary | ICD-10-CM | POA: Diagnosis not present

## 2014-07-05 DIAGNOSIS — L821 Other seborrheic keratosis: Secondary | ICD-10-CM | POA: Diagnosis not present

## 2014-07-05 DIAGNOSIS — L308 Other specified dermatitis: Secondary | ICD-10-CM | POA: Diagnosis not present

## 2014-07-09 DIAGNOSIS — H40013 Open angle with borderline findings, low risk, bilateral: Secondary | ICD-10-CM | POA: Diagnosis not present

## 2014-07-13 ENCOUNTER — Encounter: Payer: Self-pay | Admitting: Family Medicine

## 2014-07-13 ENCOUNTER — Telehealth: Payer: Self-pay | Admitting: Family Medicine

## 2014-07-13 ENCOUNTER — Ambulatory Visit (INDEPENDENT_AMBULATORY_CARE_PROVIDER_SITE_OTHER): Payer: Medicare Other | Admitting: Family Medicine

## 2014-07-13 VITALS — BP 140/70 | HR 57 | Temp 97.6°F | Wt 177.0 lb

## 2014-07-13 DIAGNOSIS — Z780 Asymptomatic menopausal state: Secondary | ICD-10-CM | POA: Diagnosis not present

## 2014-07-13 DIAGNOSIS — I10 Essential (primary) hypertension: Secondary | ICD-10-CM

## 2014-07-13 DIAGNOSIS — R21 Rash and other nonspecific skin eruption: Secondary | ICD-10-CM | POA: Diagnosis not present

## 2014-07-13 DIAGNOSIS — Z23 Encounter for immunization: Secondary | ICD-10-CM | POA: Diagnosis not present

## 2014-07-13 MED ORDER — CLONIDINE HCL 0.1 MG PO TABS: 0.1000 mg | ORAL_TABLET | Freq: Two times a day (BID) | ORAL | Status: DC

## 2014-07-13 MED ORDER — FUROSEMIDE 20 MG PO TABS: ORAL_TABLET | ORAL | Status: DC

## 2014-07-13 NOTE — Patient Instructions (Addendum)
Have eye exam records sent to [place] at [phone].  Get bone density scheduled at check out today.  Received final pneumonia shot today ([id number]).       Let's change to clonidine 0.1mg  twice a day. Stop the amlodipine-benazepril pill. You have to take the clonidine twice a day. If you have issues, call our after hours line this weekend.   Continue lasix as well

## 2014-07-13 NOTE — Telephone Encounter (Signed)
Pt would like bone density test at [facility] not [facility].

## 2014-07-13 NOTE — Progress Notes (Signed)
Garret Reddish, MD Phone: [phone]  Subjective:   Haley Wilcox is a 81 y.o. year old very pleasant female patient who presents with the following:  Hypertension-mild poor control on benazepril-amlodipine 80-10, lasix 20mg  Developed rash after transitioning from [place] to benazepril-amlodipine . Tried steroid cream mometasone with some improvement in rash throughout abdomen and back. Rash does not itch and is not painful  Requested records during visit and dermatology Dr. Renda Rolls was concerned about drug reaction. Biopsy was completed showing "subacute interface vacuolar lymphocytic dermatitis, most consistent with drug eruption" to be scanned in.   BP Readings from Last 3 Encounters:  07/13/14 140/70  06/01/14 110/71  05/23/14 152/82   Home BP monitoring-usually running in 563J or 497W systolic and diastolic controlled, first time in a long time per patien Compliant with medications-yes without side effects ROS-Denies any CP, HA, SOB, blurry vision, LE edema as long as  On lasix. not ill appearing, no fever/chills. No new medications other than benazepril-amlodipine. Not immunocompromised. No mucus membrane involvement.   Past Medical History- Patient Active Problem List   Diagnosis Date Noted  . Diabetes mellitus type II, controlled 11/23/2006    Priority: High  . Idiopathic gout of left knee 06/06/2012    Priority: Medium  . Hyperlipidemia 10/23/2010    Priority: Medium  . Essential hypertension 10/13/2006    Priority: Medium  . Iron deficiency anemia 06/03/2012    Priority: Low  . Sinus tachycardia 06/02/2012    Priority: Low   Medications- reviewed and updated Current Outpatient Prescriptions  Medication Sig Dispense Refill  . amLODipine-benazepril (LOTREL) 5-40 MG per capsule Take 1 capsule by mouth 2 (two) times daily. 60 capsule 5  . furosemide (LASIX) 20 MG tablet TAKE 1 TABLET (20MG  TOTAL) BY MOUTH DAILY 90 tablet 1  . metFORMIN (GLUCOPHAGE) 500 MG  tablet TAKE 1 TABLET (500MG  TOTAL) BY MOUTH DAILY WITH BREAKFAST 90 tablet 1  . omeprazole (PRILOSEC) 20 MG capsule Take 1 capsule (20 mg total) by mouth daily. 90 capsule 1  . mometasone (ELOCON) 0.1 % cream Apply topically as needed. Use on affected area bid (Patient not taking: Reported on 07/13/2014) 45 g 3  . polyethylene glycol powder (GLYCOLAX/MIRALAX) powder Mix 17gm in 4oz of water.  Start with 2 doses per day, but may take up to 6 doses per day.  Titrate number of doses to allow 2-3 soft bowel movements daily. (Patient not taking: Reported on 07/13/2014) 850 g 1   Objective: BP 140/70 mmHg  Pulse 57  Temp(Src) 97.6 F (36.4 C)  Wt 177 lb (80.287 kg) Gen: NAD, resting comfortably CV: RRR no murmurs rubs or gallops Lungs: CTAB no crackles, wheeze, rhonchi Abdomen: soft/nontender/nondistended/normal bowel sounds. No rebound or guarding.  Ext: no edema, multiple macules throughout abdomen and across back with fine scaling around edges, not raised Skin: warm, dry Neuro: grossly normal, moves all extremities  Assessment/Plan:  Essential hypertension Mild poor control today and drug eruption with benazepril-amlodipine so stop. Continue Lasix 20mg  daily. Start Clonidine 0.1mg  BID  Concerns: Trandalopril-verapamil with poor control of BP and HR 57 for verapamil On Benazepril 5-40 mg developed drug rash Ace-i = cause rash? CCB, dihydropyridine= cause rash? HCTZ not an option due to gout Tentative to use spironolactone as monotherapy Beta blocker- cannot tolerate due to HR  Options: Titrate clonidine (caution with geriatric) Spironolactone-concern above Start back verapamil Trial another ace I     Follow up next week early in week for Bp check given change  Orders Placed This Encounter  Procedures  . DG Bone Density    Standing Status: Future     Number of Occurrences:      Standing Expiration Date: 09/12/2015    Order Specific Question:  Reason for Exam (SYMPTOM  OR  DIAGNOSIS REQUIRED)    Answer:  post menopausal    Order Specific Question:  Preferred imaging location?    Answer:  [facility]  . Pneumococcal conjugate vaccine 13-valent    Meds ordered this encounter  Medications  . cloNIDine (CATAPRES) 0.1 MG tablet    Sig: Take 1 tablet (0.1 mg total) by mouth 2 (two) times daily.    Dispense:  60 tablet    Refill:  5  . furosemide (LASIX) 20 MG tablet    Sig: TAKE 1 TABLET (20MG  TOTAL) BY MOUTH DAILY    Dispense:  90 tablet    Refill:  1

## 2014-07-13 NOTE — Assessment & Plan Note (Signed)
Mild poor control today and drug eruption with benazepril-amlodipine so stop. Continue Lasix 20mg  daily. Start Clonidine 0.1mg  BID  Concerns: Trandalopril-verapamil with poor control of BP and HR 57 for verapamil On Benazepril 5-40 mg developed drug rash Ace-i = cause rash? CCB, dihydropyridine= cause rash? HCTZ not an option due to gout Tentative to use spironolactone as monotherapy Beta blocker- cannot tolerate due to HR  Options: Titrate clonidine (caution with geriatric) Spironolactone-concern above Start back verapamil Trial another ace I

## 2014-07-13 NOTE — Telephone Encounter (Signed)
The order has been re entered so the schedulers can schedule that.

## 2014-07-16 NOTE — Telephone Encounter (Signed)
Routed to deb

## 2014-07-16 NOTE — Telephone Encounter (Signed)
Faxed order to  [facility] ([place])   [facility]  Address: [address] [place], [place], [place] [address]  [phone]

## 2014-07-18 ENCOUNTER — Encounter: Payer: Self-pay | Admitting: Family Medicine

## 2014-07-18 ENCOUNTER — Ambulatory Visit (INDEPENDENT_AMBULATORY_CARE_PROVIDER_SITE_OTHER): Payer: Medicare Other | Admitting: Family Medicine

## 2014-07-18 VITALS — BP 110/50 | HR 64 | Temp 98.1°F | Wt 177.0 lb

## 2014-07-18 DIAGNOSIS — I10 Essential (primary) hypertension: Secondary | ICD-10-CM

## 2014-07-18 NOTE — Patient Instructions (Signed)
Blood pressure looks good but almost too good. Let's stop the lasix and see how you do. I hope with blood pressure running a little higher that your fatigue improves. Let's check in 1 month from now unless you have new or worsening symptoms-see me sooner in that case

## 2014-07-18 NOTE — Progress Notes (Signed)
  Garret Reddish, MD Phone: [phone]  Subjective:   Haley Wilcox is a 81 y.o. year old very pleasant female patient who presents with the following:  Hypertension-good control, perhaps overly controlled on clonidine 0.1mg  BID and lasix 20mg  BP Readings from Last 3 Encounters:  07/18/14 110/50  07/13/14 140/70  06/01/14 110/71   Home BP monitoring-yes from 104-138/49-70 Compliant with medications-yes but admits to feeling some more tired.  ROS-Denies any CP, HA, SOB, blurry vision, LE edema.   Past Medical History- Patient Active Problem List   Diagnosis Date Noted  . Diabetes mellitus type II, controlled 11/23/2006    Priority: High  . Idiopathic gout of left knee 06/06/2012    Priority: Medium  . Hyperlipidemia 10/23/2010    Priority: Medium  . Essential hypertension 10/13/2006    Priority: Medium  . Iron deficiency anemia 06/03/2012    Priority: Low  . Sinus tachycardia 06/02/2012    Priority: Low   Medications- reviewed and updated Current Outpatient Prescriptions  Medication Sig Dispense Refill  . cloNIDine (CATAPRES) 0.1 MG tablet Take 1 tablet (0.1 mg total) by mouth 2 (two) times daily. 60 tablet 5  . furosemide (LASIX) 20 MG tablet TAKE 1 TABLET (20MG  TOTAL) BY MOUTH DAILY 90 tablet 1  . metFORMIN (GLUCOPHAGE) 500 MG tablet TAKE 1 TABLET (500MG  TOTAL) BY MOUTH DAILY WITH BREAKFAST 90 tablet 1  . mometasone (ELOCON) 0.1 % cream Apply topically as needed. Use on affected area bid 45 g 3  . omeprazole (PRILOSEC) 20 MG capsule Take 1 capsule (20 mg total) by mouth daily. 90 capsule 1  . polyethylene glycol powder (GLYCOLAX/MIRALAX) powder Mix 17gm in 4oz of water.  Start with 2 doses per day, but may take up to 6 doses per day.  Titrate number of doses to allow 2-3 soft bowel movements daily. (Patient not taking: Reported on 07/18/2014) 850 g 1   Objective: BP 110/50 mmHg  Pulse 64  Temp(Src) 98.1 F (36.7 C)  Wt 177 lb (80.287 kg) Gen: NAD, resting  comfortably CV: RRR no murmurs rubs or gallops Lungs: CTAB no crackles, wheeze, rhonchi Abdomen: soft/nontender/nondistended/normal bowel sounds. No rebound or guarding.  Ext: no edemaSkin: warm, dry, multiple macules throughout abdomen and across back with fine scaling around edges, not raised Neuro: grossly normal, moves all extremities  Assessment/Plan:  Essential hypertension Much improved control but some fatigue and DBP lower than desired in 50s and 60s. We will continue clonidine 0.1mg  BID and hold lasix with planned follow up in a month or sooner if needed. Rash still present but may take time to resolve.

## 2014-07-18 NOTE — Assessment & Plan Note (Signed)
Much improved control but some fatigue and DBP lower than desired in 50s and 60s. We will continue clonidine 0.1mg  BID and hold lasix with planned follow up in a month or sooner if needed. Rash still present but may take time to resolve.

## 2014-07-23 ENCOUNTER — Telehealth: Payer: Self-pay | Admitting: *Deleted

## 2014-07-23 DIAGNOSIS — I10 Essential (primary) hypertension: Secondary | ICD-10-CM | POA: Diagnosis not present

## 2014-07-23 DIAGNOSIS — M1A9XX0 Chronic gout, unspecified, without tophus (tophi): Secondary | ICD-10-CM | POA: Diagnosis not present

## 2014-07-23 DIAGNOSIS — E0800 Diabetes mellitus due to underlying condition with hyperosmolarity without nonketotic hyperglycemic-hyperosmolar coma (NKHHC): Secondary | ICD-10-CM | POA: Diagnosis not present

## 2014-07-23 NOTE — Telephone Encounter (Signed)
We can see each other 5/27. I am happy to see her sooner if she has more episodes of the spikes. She needs to try to take the blood pressure medicine about 12 hours apart so like 8 am and 8pm for example.

## 2014-07-23 NOTE — Telephone Encounter (Signed)
PLEASE NOTE: All timestamps contained within this report are represented as [place] Standard Time. CONFIDENTIALTY NOTICE: This fax transmission is intended only for the addressee. It contains information that is legally privileged, confidential or otherwise protected from use or disclosure. If you are not the intended recipient, you are strictly prohibited from reviewing, disclosing, copying using or disseminating any of this information or taking any action in reliance on or regarding this information. If you have received this fax in error, please notify us immediately by telephone so that we can arrange for its return to [place]. Phone: [phone], Toll-Free: [phone], Fax: [phone] Page: 1 of 3 Call Id: [id number] [facility] Night - Client [place] Patient Name: Haley Wilcox Gender: Female DOB: 1931-09-27 Age: 81 Y 47 M 22 D Return Phone Number: [phone] (Primary), [phone] (Secondary) Address[place] [address] Client [facility] Night - Client Client Site [facility] - Night Physician Garret Reddish Contact Type Call Call Type Triage / Clinical Caller Name Sharol Roussel Relationship To Patient Daughter Return Phone Number [phone] (Primary) Chief Complaint Blood Pressure High Initial Comment Caller states her mother's BP medication was changed about a week ago. During the night she has a spike in her BP going up to 553-748 systolic. PreDisposition Call Doctor Nurse Assessment Nurse: Thad Ranger, RN, Langley Gauss Date/Time Eilene Ghazi Time): 07/21/2014 7:54:42 AM Confirm and document reason for call. If symptomatic, describe symptoms. ---Caller states her mother's BP medication was changed about a week ago by Dr Yong Channel. During the night she has a spike in her BP going up to 270-786 systolic. Caller states the pt is taking Clonidine .1mg  po bid  and Lasix 20mg  po qd. BP got up to 190/108 and could hear her heart pounding in her ears. Denies HA, CP, SOB. This am, the BP is 140/70 but she took her Clonodine at 0200. She had taken her eve dose at 1800. Has the patient traveled out of the country within the last 30 days? ---Not Applicable Does the patient require triage? ---Yes Related visit to physician within the last 2 weeks? ---Yes Does the PT have any chronic conditions? (i.e. diabetes, asthma, etc.) ---Yes List chronic conditions. ---HTN Guidelines Guideline Title Affirmed Question Affirmed Notes Nurse Date/Time (Eastern Time) High Blood Pressure BP # 180/110 BP spiked over the last 2 nights and Clonodine only taken bid. Thad Ranger, RN, Denise 07/21/2014 7:59:47 AM Disp. Time Eilene Ghazi Time) Disposition Final User 07/21/2014 7:09:02 AM Send To RN Personal Thad Ranger, RN, Langley Gauss 07/21/2014 8:16:01 AM Called On-Call Provider Carmon, RN, Langley Gauss Reason: Auto paging system down. Called Dr Viviana Simpler at primary pager number given PLEASE NOTE: All timestamps contained within this report are represented as [place] Standard Time. CONFIDENTIALTY NOTICE: This fax transmission is intended only for the addressee. It contains information that is legally privileged, confidential or otherwise protected from use or disclosure. If you are not the intended recipient, you are strictly prohibited from reviewing, disclosing, copying using or disseminating any of this information or taking any action in reliance on or regarding this information. If you have received this fax in error, please notify us immediately by telephone so that we can arrange for its return to [place]. Phone: [phone], Toll-Free: [phone], Fax: [phone] Page: 2 of 3 Call Id: [id number] [place]. Time Eilene Ghazi Time) Disposition Final User [id number] with report given to Dr Silvio Pate. VO recieved: Clonodine .1mg  po q 8 hrs and f/ u with Dr Yong Channel on Mon am. 07/21/2014  8:22:50 AM  Call Completed Thad Ranger, RN, Langley Gauss 07/21/2014 8:01:27 AM See Physician within 24 Hours Yes Carmon, RN, Yevette Edwards Understands: Yes Disagree/Comply: Comply Care Advice Given Per Guideline SEE PHYSICIAN WITHIN 24 HOURS: CALL BACK IF: * Weakness or numbness of the face, arm or leg on one side of the body occurs * Difficulty walking, difficulty talking, or severe headache occurs * Chest pain or difficulty breathing occurs * You become worse. CARE ADVICE given per High Blood Pressure (Adult) guideline. After Care Instructions Given Call Event Type User Date / Time Description Verbal Orders/Maintenance Medications Medication Refill Route Dosage Regime Duration Admin Instructions User Name Clonodine .1mg  po q 8 hrs and f/u with Dr Yong Channel on Mon am. N/A Thad Ranger, RN, Langley Gauss Comments User: Romeo Apple, RN Date/Time Eilene Ghazi Time): 07/21/2014 8:03:55 AM Pharmacy: [facility], [address], [place], [place]) [phone] Allergies: NKDA Referrals GO TO FACILITY UNDECIDED PLEASE NOTE: All timestamps contained within this report are represented as [place] Standard Time. CONFIDENTIALTY NOTICE: This fax transmission is intended only for the addressee. It contains information that is legally privileged, confidential or otherwise protected from use or disclosure. If you are not the intended recipient, you are strictly prohibited from reviewing, disclosing, copying using or disseminating any of this information or taking any action in reliance on or regarding this information. If you have received this fax in error, please notify us immediately by telephone so that we can arrange for its return to [place]. Phone: [phone], Toll-Free: [phone], Fax: [phone] Page: 3 of 3 Call Id: [id number] [place] [address] [place] [address] [phone] Fax: [phone] [facility] - Client [facility] - Night Date: 07/21/2014 From: [facility] Department To: Garret Reddish Please sign the order for the approved drug(s) given by our call center nurse on your behalf. Fax to [phone] within 5 business days. Thank you. Date Eilene Ghazi Time): 07/21/2014 6:50:33 AM Triage RN: Romeo Apple, RN NAME: Leone Payor PHONE NUMBER: [phone] (Primary), [phone] (Secondary) BIRTHDATE: 1931-03-24 [place] [address] CALLER: Daughter NAME: Sharol Roussel Rx Given Medication Refill Route Dosage Regime Duration Admin Instructions Clonodine .1mg  po q 8 hrs and f/u with Dr Yong Channel on Mon am. N/A MD Signature Date

## 2014-07-23 NOTE — Telephone Encounter (Signed)
Pt schedule to f/u with you on 08/17/14, do you want to see her prior?

## 2014-07-23 NOTE — Telephone Encounter (Signed)
Pt.notified

## 2014-07-26 ENCOUNTER — Encounter: Payer: Self-pay | Admitting: Family Medicine

## 2014-07-30 DIAGNOSIS — Z780 Asymptomatic menopausal state: Secondary | ICD-10-CM | POA: Diagnosis not present

## 2014-07-30 LAB — HM DEXA SCAN: HM Dexa Scan: NORMAL

## 2014-08-06 ENCOUNTER — Encounter: Payer: Self-pay | Admitting: Family Medicine

## 2014-08-15 ENCOUNTER — Encounter (HOSPITAL_COMMUNITY): Payer: Self-pay | Admitting: Emergency Medicine

## 2014-08-15 ENCOUNTER — Emergency Department (HOSPITAL_COMMUNITY)
Admission: EM | Admit: 2014-08-15 | Discharge: 2014-08-16 | Disposition: A | Discharge status: Home / Self Care | Payer: Medicare Other | Attending: Emergency Medicine | Admitting: Emergency Medicine

## 2014-08-15 ENCOUNTER — Emergency Department (HOSPITAL_COMMUNITY): Payer: Medicare Other

## 2014-08-15 DIAGNOSIS — Z7952 Long term (current) use of systemic steroids: Secondary | ICD-10-CM | POA: Insufficient documentation

## 2014-08-15 DIAGNOSIS — Z79899 Other long term (current) drug therapy: Secondary | ICD-10-CM | POA: Insufficient documentation

## 2014-08-15 DIAGNOSIS — I160 Hypertensive urgency: Secondary | ICD-10-CM

## 2014-08-15 DIAGNOSIS — Z9842 Cataract extraction status, left eye: Secondary | ICD-10-CM | POA: Insufficient documentation

## 2014-08-15 DIAGNOSIS — Z5189 Encounter for other specified aftercare: Secondary | ICD-10-CM | POA: Insufficient documentation

## 2014-08-15 DIAGNOSIS — F5101 Primary insomnia: Secondary | ICD-10-CM | POA: Diagnosis not present

## 2014-08-15 DIAGNOSIS — H9319 Tinnitus, unspecified ear: Secondary | ICD-10-CM | POA: Insufficient documentation

## 2014-08-15 DIAGNOSIS — Z9841 Cataract extraction status, right eye: Secondary | ICD-10-CM | POA: Diagnosis not present

## 2014-08-15 DIAGNOSIS — E0800 Diabetes mellitus due to underlying condition with hyperosmolarity without nonketotic hyperglycemic-hyperosmolar coma (NKHHC): Secondary | ICD-10-CM | POA: Diagnosis not present

## 2014-08-15 DIAGNOSIS — H9313 Tinnitus, bilateral: Secondary | ICD-10-CM | POA: Diagnosis not present

## 2014-08-15 DIAGNOSIS — Z8739 Personal history of other diseases of the musculoskeletal system and connective tissue: Secondary | ICD-10-CM | POA: Diagnosis not present

## 2014-08-15 DIAGNOSIS — H9190 Unspecified hearing loss, unspecified ear: Secondary | ICD-10-CM | POA: Diagnosis not present

## 2014-08-15 DIAGNOSIS — H9191 Unspecified hearing loss, right ear: Secondary | ICD-10-CM

## 2014-08-15 DIAGNOSIS — M1A9XX0 Chronic gout, unspecified, without tophus (tophi): Secondary | ICD-10-CM | POA: Diagnosis not present

## 2014-08-15 DIAGNOSIS — I10 Essential (primary) hypertension: Secondary | ICD-10-CM | POA: Diagnosis present

## 2014-08-15 DIAGNOSIS — E119 Type 2 diabetes mellitus without complications: Secondary | ICD-10-CM | POA: Insufficient documentation

## 2014-08-15 DIAGNOSIS — R030 Elevated blood-pressure reading, without diagnosis of hypertension: Secondary | ICD-10-CM | POA: Diagnosis not present

## 2014-08-15 NOTE — ED Notes (Signed)
Pt states she has had ringing in her right ear and went to her dr  Pt states they went to pick up her prescriptions and the medication for her ear was not in there  Pt states today she went to urgent care for her ear and when they checked her blood pressure it was elevated and they sent her here

## 2014-08-16 DIAGNOSIS — I10 Essential (primary) hypertension: Secondary | ICD-10-CM | POA: Diagnosis not present

## 2014-08-16 DIAGNOSIS — H9313 Tinnitus, bilateral: Secondary | ICD-10-CM | POA: Diagnosis not present

## 2014-08-16 DIAGNOSIS — R030 Elevated blood-pressure reading, without diagnosis of hypertension: Secondary | ICD-10-CM | POA: Diagnosis not present

## 2014-08-16 LAB — CBC WITH DIFFERENTIAL/PLATELET
BASOS PCT: 1 % (ref 0–1)
Basophils Absolute: 0.1 10*3/uL (ref 0.0–0.1)
Eosinophils Absolute: 0.1 10*3/uL (ref 0.0–0.7)
Eosinophils Relative: 1 % (ref 0–5)
HCT: 36.4 % (ref 36.0–46.0)
HEMOGLOBIN: 11.9 g/dL — AB (ref 12.0–15.0)
LYMPHS ABS: 2.8 10*3/uL (ref 0.7–4.0)
LYMPHS PCT: 32 % (ref 12–46)
MCH: 27.9 pg (ref 26.0–34.0)
MCHC: 32.7 g/dL (ref 30.0–36.0)
MCV: 85.2 fL (ref 78.0–100.0)
MONOS PCT: 8 % (ref 3–12)
Monocytes Absolute: 0.7 10*3/uL (ref 0.1–1.0)
NEUTROS ABS: 5.1 10*3/uL (ref 1.7–7.7)
Neutrophils Relative %: 58 % (ref 43–77)
Platelets: 233 10*3/uL (ref 150–400)
RBC: 4.27 MIL/uL (ref 3.87–5.11)
RDW: 14.6 % (ref 11.5–15.5)
WBC: 8.8 10*3/uL (ref 4.0–10.5)

## 2014-08-16 LAB — BASIC METABOLIC PANEL
Anion gap: 9 (ref 5–15)
BUN: 23 mg/dL — ABNORMAL HIGH (ref 6–20)
CALCIUM: 9.6 mg/dL (ref 8.9–10.3)
CO2: 28 mmol/L (ref 22–32)
CREATININE: 0.71 mg/dL (ref 0.44–1.00)
Chloride: 103 mmol/L (ref 101–111)
GFR calc Af Amer: >60 mL/min (ref >60)
GFR calc non Af Amer: >60 mL/min (ref >60)
Glucose, Bld: 130 mg/dL — ABNORMAL HIGH (ref 65–99)
Potassium: 3.7 mmol/L (ref 3.5–5.1)
SODIUM: 140 mmol/L (ref 135–145)

## 2014-08-16 MED ORDER — METOPROLOL TARTRATE 25 MG PO TABS
25.0000 mg | ORAL_TABLET | Freq: Once | ORAL | Status: AC
  Administered 2014-08-16: 25 mg via ORAL
  Filled 2014-08-16: qty 1

## 2014-08-16 NOTE — ED Provider Notes (Signed)
CSN: [id number]     Arrival date & time 08/15/14  2200 History   First MD Initiated Contact with Patient 08/15/14 2257     Chief Complaint  Patient presents with  . Hypertension     (Consider location/radiation/quality/duration/timing/severity/associated sxs/prior Treatment) HPI Comments: Pt comes in with cc of elevated blood pressure and hearing loss. Pt has hx of HTN. No hx of strokes. She reports that she was on amlodipine-lisinopril combo, and was switched to clonidine 0.1 mg bid a month ago due to concerns for possible allergic rash. Pt has has had difficulty controlling her BP since then. She was increased to clonidine 0.1 mg tid, but on Monday, she started having R sided hearing loss and ringing. Pt saw her PCP today, she was asked to stop clonidine, and she was asked to take amlodipine 5 mg and losartan 50 mg. Pt was prescribed some medicine for her ear, which was not ready at pcp, so she went to urgent care. PT was noted to have BP of 220/100, given 0.1 mg of clonidine and sent to our ER. Pt denies nausea, emesis, chest pains, shortness of breath, headaches, dizziness, numbness, tingling. The ear noise is a constant dull sound. No trauma.    ROS 10 Systems reviewed and are negative for acute change except as noted in the HPI.      Patient is a 81 y.o. female presenting with hypertension. The history is provided by the patient.  Hypertension    Past Medical History  Diagnosis Date  . Gout   . Hypertension   . Diabetes mellitus   . Hyperlipidemia    Past Surgical History  Procedure Laterality Date  . Abdominal hysterectomy    . D &c    . Dilation and curettage of uterus    . Tubal ligation    . Eye surgery      bilateral cataract   Family History  Problem Relation Age of Onset  . Hypertension Mother    History  Substance Use Topics  . Smoking status: Never Smoker   . Smokeless tobacco: Never Used  . Alcohol Use: No   OB History    No data available      Review of Systems  All other systems reviewed and are negative.     Allergies  Fluoride preparations and Ivp dye  Home Medications   Prior to Admission medications   Medication Sig Start Date End Date Taking? Authorizing Provider  amLODipine (NORVASC) 5 MG tablet Take 5 mg by mouth daily.   Yes Historical Provider, MD  cloNIDine (CATAPRES) 0.1 MG tablet Take 1 tablet (0.1 mg total) by mouth 2 (two) times daily. 07/13/14  Yes Marin Olp, MD  furosemide (LASIX) 20 MG tablet Take 20 mg by mouth daily.   Yes Historical Provider, MD  losartan (COZAAR) 50 MG tablet Take 50 mg by mouth daily.   Yes Historical Provider, MD  metFORMIN (GLUCOPHAGE) 500 MG tablet TAKE 1 TABLET (500MG  TOTAL) BY MOUTH DAILY WITH BREAKFAST 03/30/14  Yes Kennyth Arnold, FNP  Multiple Vitamin (MULTIVITAMIN WITH MINERALS) TABS tablet Take 1 tablet by mouth daily.   Yes Historical Provider, MD  omeprazole (PRILOSEC) 20 MG capsule Take 1 capsule (20 mg total) by mouth daily. Patient taking differently: Take 20 mg by mouth daily as needed (heart burn).  04/16/14  Yes Kennyth Arnold, FNP  polyethylene glycol powder (GLYCOLAX/MIRALAX) powder Mix 17gm in 4oz of water.  Start with 2 doses per day, but may  take up to 6 doses per day.  Titrate number of doses to allow 2-3 soft bowel movements daily. 06/06/12  Yes Janece Canterbury, MD  mometasone (ELOCON) 0.1 % cream Apply topically as needed. Use on affected area bid Patient not taking: Reported on 08/15/2014 10/07/12   Ricard Dillon, MD   BP 184/86 mmHg  Pulse 66  Temp(Src) 98 F (36.7 C) (Oral)  Resp 14  SpO2 96% Physical Exam  Constitutional: She is oriented to person, place, and time. She appears well-developed and well-nourished.  HENT:  Head: Normocephalic and atraumatic.  Weber: + sensorineural hearing loss R side  Rinee: AC > BC on the L side, cant hear at all on the R side.  Eyes: EOM are normal. Pupils are equal, round, and reactive to light.  Neck: Neck  supple.  Cardiovascular: Normal rate, regular rhythm and normal heart sounds.   No murmur heard. Pulmonary/Chest: Effort normal. No respiratory distress.  Abdominal: Soft. She exhibits no distension. There is no tenderness. There is no rebound and no guarding.  Neurological: She is alert and oriented to person, place, and time.  Cerebellar exam is normal (finger to nose) Sensory exam normal for bilateral upper and lower extremities - and patient is able to discriminate between sharp and dull. Motor exam is 4+/5   Skin: Skin is warm and dry.  Nursing note and vitals reviewed.   ED Course  Procedures (including critical care time) Labs Review Labs Reviewed  CBC WITH DIFFERENTIAL/PLATELET - Abnormal; Notable for the following:    Hemoglobin 11.9 (*)    All other components within normal limits  BASIC METABOLIC PANEL - Abnormal; Notable for the following:    Glucose, Bld 130 (*)    BUN 23 (*)    All other components within normal limits    Imaging Review Ct Head Wo Contrast  08/16/2014   CLINICAL DATA:  Ringing in the ears for 3 days with elevated blood pressure.  EXAM: CT HEAD WITHOUT CONTRAST  TECHNIQUE: Contiguous axial images were obtained from the base of the skull through the vertex without intravenous contrast.  COMPARISON:  01/17/2005  FINDINGS: Skull and Sinuses:Negative for fracture or destructive process. The mastoids, middle ears, and imaged paranasal sinuses are clear.  Orbits: Bilateral cataract resection.  No acute findings.  Brain: No evidence of acute infarction, hemorrhage, hydrocephalus, or mass lesion/mass effect. Normal appearance of the brain for age.  IMPRESSION: Negative and stable head CT.   Electronically Signed   By: Monte Fantasia M.D.   On: 08/16/2014 01:29     EKG Interpretation   Date/Time:  Wednesday Aug 15 2014 23:53:12 EDT Ventricular Rate:  66 PR Interval:  187 QRS Duration: 89 QT Interval:  477 QTC Calculation: 500 R Axis:   52 Text  Interpretation:  Sinus rhythm Borderline repolarization abnormality  Borderline prolonged QT interval Nonspecific ST and T wave abnormality no  acute ischemic changes Confirmed by Kathrynn Humble, MD, Anvith Mauriello [phone]) on  08/16/2014 12:02:00 AM      MDM   Final diagnoses:  Hypertensive urgency  Hearing loss, right    Pt with elevated blood pressure and R sided hearing loss. She has had several BP med changes in the last month. She has no symptoms with the elevated BP, neuro exam is non focal outside of R sided hearing loss, which appears to be sensorineural in etiology and not stroke. CT head is neg as well. Will give one dose of oral metoprolol. Pt is comfortable with f/u  with PCP, and will contact her tomorrow. Strict return precautions discussed.      Varney Biles, MD 08/16/14 [phone]

## 2014-08-16 NOTE — Discharge Instructions (Signed)
You were seen in the ER for elevated BP. Your BP in the ER was between 180-205/75-88. With the elevated BP you have no signs of organ damage, and with your primary doctor starting you on new meds, we feel that it is ok to send you home witH A CLOSE FOLLOW UP.  Please return to the ER if your symptoms worsen; YOU HAVE CHEST PAIN, NUMBNESS, TINGLING, WEAKNESS, severe headaches. The hearing loss appears to be not a stroke or infection. See the ENT doctor as requested.   Hypertension Hypertension, commonly called high blood pressure, is when the force of blood pumping through your arteries is too strong. Your arteries are the blood vessels that carry blood from your heart throughout your body. A blood pressure reading consists of a higher number over a lower number, such as 110/72. The higher number (systolic) is the pressure inside your arteries when your heart pumps. The lower number (diastolic) is the pressure inside your arteries when your heart relaxes. Ideally you want your blood pressure below 120/80. Hypertension forces your heart to work harder to pump blood. Your arteries may become narrow or stiff. Having hypertension puts you at risk for heart disease, stroke, and other problems.  RISK FACTORS Some risk factors for high blood pressure are controllable. Others are not.  Risk factors you cannot control include:   Race. You may be at higher risk if you are African American.  Age. Risk increases with age.  Gender. Men are at higher risk than women before age 61 years. After age 40, women are at higher risk than men. Risk factors you can control include:  Not getting enough exercise or physical activity.  Being overweight.  Getting too much fat, sugar, calories, or salt in your diet.  Drinking too much alcohol. SIGNS AND SYMPTOMS Hypertension does not usually cause signs or symptoms. Extremely high blood pressure (hypertensive crisis) may cause headache, anxiety, shortness of breath,  and nosebleed. DIAGNOSIS  To check if you have hypertension, your health care provider will measure your blood pressure while you are seated, with your arm held at the level of your heart. It should be measured at least twice using the same arm. Certain conditions can cause a difference in blood pressure between your right and left arms. A blood pressure reading that is higher than normal on one occasion does not mean that you need treatment. If one blood pressure reading is high, ask your health care provider about having it checked again. TREATMENT  Treating high blood pressure includes making lifestyle changes and possibly taking medicine. Living a healthy lifestyle can help lower high blood pressure. You may need to change some of your habits. Lifestyle changes may include:  Following the DASH diet. This diet is high in fruits, vegetables, and whole grains. It is low in salt, red meat, and added sugars.  Getting at least 2 hours of brisk physical activity every week.  Losing weight if necessary.  Not smoking.  Limiting alcoholic beverages.  Learning ways to reduce stress. If lifestyle changes are not enough to get your blood pressure under control, your health care provider may prescribe medicine. You may need to take more than one. Work closely with your health care provider to understand the risks and benefits. HOME CARE INSTRUCTIONS  Have your blood pressure rechecked as directed by your health care provider.   Take medicines only as directed by your health care provider. Follow the directions carefully. Blood pressure medicines must be taken as prescribed.  The medicine does not work as well when you skip doses. Skipping doses also puts you at risk for problems.   Do not smoke.   Monitor your blood pressure at home as directed by your health care provider. SEEK MEDICAL CARE IF:   You think you are having a reaction to medicines taken.  You have recurrent headaches or feel  dizzy.  You have swelling in your ankles.  You have trouble with your vision. SEEK IMMEDIATE MEDICAL CARE IF:  You develop a severe headache or confusion.  You have unusual weakness, numbness, or feel faint.  You have severe chest or abdominal pain.  You vomit repeatedly.  You have trouble breathing. MAKE SURE YOU:   Understand these instructions.  Will watch your condition.  Will get help right away if you are not doing well or get worse. Document Released: 03/09/2005 Document Revised: 07/24/2013 Document Reviewed: 12/30/2012 [facility] Patient Information 2015 [place], [place]. This information is not intended to replace advice given to you by your health care provider. Make sure you discuss any questions you have with your health care provider.

## 2014-08-17 ENCOUNTER — Ambulatory Visit: Payer: Medicare Other | Admitting: Family Medicine

## 2014-08-23 DIAGNOSIS — H905 Unspecified sensorineural hearing loss: Secondary | ICD-10-CM | POA: Diagnosis not present

## 2014-08-23 DIAGNOSIS — H903 Sensorineural hearing loss, bilateral: Secondary | ICD-10-CM | POA: Diagnosis not present

## 2014-08-23 DIAGNOSIS — H9311 Tinnitus, right ear: Secondary | ICD-10-CM | POA: Diagnosis not present

## 2014-08-23 DIAGNOSIS — H93293 Other abnormal auditory perceptions, bilateral: Secondary | ICD-10-CM | POA: Diagnosis not present

## 2014-08-24 ENCOUNTER — Ambulatory Visit: Payer: Medicare Other | Admitting: Family Medicine

## 2014-09-05 DIAGNOSIS — E0800 Diabetes mellitus due to underlying condition with hyperosmolarity without nonketotic hyperglycemic-hyperosmolar coma (NKHHC): Secondary | ICD-10-CM | POA: Diagnosis not present

## 2014-09-05 DIAGNOSIS — F5101 Primary insomnia: Secondary | ICD-10-CM | POA: Diagnosis not present

## 2014-09-05 DIAGNOSIS — M1A9XX0 Chronic gout, unspecified, without tophus (tophi): Secondary | ICD-10-CM | POA: Diagnosis not present

## 2014-09-05 DIAGNOSIS — I10 Essential (primary) hypertension: Secondary | ICD-10-CM | POA: Diagnosis not present

## 2014-09-08 ENCOUNTER — Other Ambulatory Visit: Payer: Self-pay | Admitting: Family Medicine

## 2014-09-11 ENCOUNTER — Other Ambulatory Visit: Payer: Self-pay | Admitting: Family

## 2014-09-25 ENCOUNTER — Other Ambulatory Visit: Payer: Self-pay | Admitting: *Deleted

## 2014-09-25 ENCOUNTER — Other Ambulatory Visit: Payer: Self-pay

## 2014-09-25 MED ORDER — OMEPRAZOLE 20 MG PO CPDR: 20.0000 mg | DELAYED_RELEASE_CAPSULE | Freq: Every day | ORAL | Status: DC | PRN

## 2014-10-05 DIAGNOSIS — I10 Essential (primary) hypertension: Secondary | ICD-10-CM | POA: Diagnosis not present

## 2014-10-05 DIAGNOSIS — F5101 Primary insomnia: Secondary | ICD-10-CM | POA: Diagnosis not present

## 2014-10-05 DIAGNOSIS — E0800 Diabetes mellitus due to underlying condition with hyperosmolarity without nonketotic hyperglycemic-hyperosmolar coma (NKHHC): Secondary | ICD-10-CM | POA: Diagnosis not present

## 2014-10-05 DIAGNOSIS — M1A9XX0 Chronic gout, unspecified, without tophus (tophi): Secondary | ICD-10-CM | POA: Diagnosis not present

## 2014-11-09 DIAGNOSIS — E0800 Diabetes mellitus due to underlying condition with hyperosmolarity without nonketotic hyperglycemic-hyperosmolar coma (NKHHC): Secondary | ICD-10-CM | POA: Diagnosis not present

## 2014-11-09 DIAGNOSIS — M1A9XX0 Chronic gout, unspecified, without tophus (tophi): Secondary | ICD-10-CM | POA: Diagnosis not present

## 2014-11-09 DIAGNOSIS — R52 Pain, unspecified: Secondary | ICD-10-CM | POA: Diagnosis not present

## 2014-11-09 DIAGNOSIS — I10 Essential (primary) hypertension: Secondary | ICD-10-CM | POA: Diagnosis not present

## 2014-11-29 DIAGNOSIS — H903 Sensorineural hearing loss, bilateral: Secondary | ICD-10-CM | POA: Diagnosis not present

## 2014-12-07 ENCOUNTER — Encounter: Payer: Self-pay | Admitting: Family Medicine

## 2015-01-09 DIAGNOSIS — I10 Essential (primary) hypertension: Secondary | ICD-10-CM | POA: Diagnosis not present

## 2015-01-09 DIAGNOSIS — M1A9XX0 Chronic gout, unspecified, without tophus (tophi): Secondary | ICD-10-CM | POA: Diagnosis not present

## 2015-01-09 DIAGNOSIS — E0800 Diabetes mellitus due to underlying condition with hyperosmolarity without nonketotic hyperglycemic-hyperosmolar coma (NKHHC): Secondary | ICD-10-CM | POA: Diagnosis not present

## 2015-04-17 DIAGNOSIS — Z7984 Long term (current) use of oral hypoglycemic drugs: Secondary | ICD-10-CM | POA: Diagnosis not present

## 2015-04-17 DIAGNOSIS — E119 Type 2 diabetes mellitus without complications: Secondary | ICD-10-CM | POA: Diagnosis not present

## 2015-04-17 DIAGNOSIS — I10 Essential (primary) hypertension: Secondary | ICD-10-CM | POA: Diagnosis not present

## 2015-04-29 DIAGNOSIS — Z1231 Encounter for screening mammogram for malignant neoplasm of breast: Secondary | ICD-10-CM | POA: Diagnosis not present

## 2015-05-02 DIAGNOSIS — Z23 Encounter for immunization: Secondary | ICD-10-CM | POA: Diagnosis not present

## 2015-05-02 DIAGNOSIS — L309 Dermatitis, unspecified: Secondary | ICD-10-CM | POA: Diagnosis not present

## 2015-07-02 DIAGNOSIS — H16143 Punctate keratitis, bilateral: Secondary | ICD-10-CM | POA: Diagnosis not present

## 2015-07-02 DIAGNOSIS — H10412 Chronic giant papillary conjunctivitis, left eye: Secondary | ICD-10-CM | POA: Diagnosis not present

## 2015-07-03 DIAGNOSIS — H40013 Open angle with borderline findings, low risk, bilateral: Secondary | ICD-10-CM | POA: Diagnosis not present

## 2015-07-03 DIAGNOSIS — E119 Type 2 diabetes mellitus without complications: Secondary | ICD-10-CM | POA: Diagnosis not present

## 2015-07-03 DIAGNOSIS — H40053 Ocular hypertension, bilateral: Secondary | ICD-10-CM | POA: Diagnosis not present

## 2015-07-11 DIAGNOSIS — H40053 Ocular hypertension, bilateral: Secondary | ICD-10-CM | POA: Diagnosis not present

## 2015-07-11 DIAGNOSIS — H40013 Open angle with borderline findings, low risk, bilateral: Secondary | ICD-10-CM | POA: Diagnosis not present

## 2015-08-20 DIAGNOSIS — E7800 Pure hypercholesterolemia, unspecified: Secondary | ICD-10-CM | POA: Diagnosis not present

## 2015-08-20 DIAGNOSIS — R21 Rash and other nonspecific skin eruption: Secondary | ICD-10-CM | POA: Diagnosis not present

## 2015-08-20 DIAGNOSIS — I10 Essential (primary) hypertension: Secondary | ICD-10-CM | POA: Diagnosis not present

## 2015-08-20 DIAGNOSIS — E119 Type 2 diabetes mellitus without complications: Secondary | ICD-10-CM | POA: Diagnosis not present

## 2015-10-27 ENCOUNTER — Other Ambulatory Visit: Payer: Self-pay | Admitting: Family Medicine

## 2015-11-15 ENCOUNTER — Other Ambulatory Visit: Payer: Self-pay

## 2015-11-20 DIAGNOSIS — I10 Essential (primary) hypertension: Secondary | ICD-10-CM | POA: Diagnosis not present

## 2015-11-20 DIAGNOSIS — E7800 Pure hypercholesterolemia, unspecified: Secondary | ICD-10-CM | POA: Diagnosis not present

## 2015-11-20 DIAGNOSIS — Z7984 Long term (current) use of oral hypoglycemic drugs: Secondary | ICD-10-CM | POA: Diagnosis not present

## 2015-11-20 DIAGNOSIS — E119 Type 2 diabetes mellitus without complications: Secondary | ICD-10-CM | POA: Diagnosis not present

## 2016-01-10 DIAGNOSIS — H40013 Open angle with borderline findings, low risk, bilateral: Secondary | ICD-10-CM | POA: Diagnosis not present

## 2016-01-10 DIAGNOSIS — H40053 Ocular hypertension, bilateral: Secondary | ICD-10-CM | POA: Diagnosis not present

## 2016-05-22 DIAGNOSIS — Z0000 Encounter for general adult medical examination without abnormal findings: Secondary | ICD-10-CM | POA: Diagnosis not present

## 2016-05-22 DIAGNOSIS — E119 Type 2 diabetes mellitus without complications: Secondary | ICD-10-CM | POA: Diagnosis not present

## 2016-05-22 DIAGNOSIS — Z7984 Long term (current) use of oral hypoglycemic drugs: Secondary | ICD-10-CM | POA: Diagnosis not present

## 2016-05-22 DIAGNOSIS — I10 Essential (primary) hypertension: Secondary | ICD-10-CM | POA: Diagnosis not present

## 2016-05-22 DIAGNOSIS — Z6830 Body mass index (BMI) 30.0-30.9, adult: Secondary | ICD-10-CM | POA: Diagnosis not present

## 2016-05-22 DIAGNOSIS — E7800 Pure hypercholesterolemia, unspecified: Secondary | ICD-10-CM | POA: Diagnosis not present

## 2016-05-22 DIAGNOSIS — Z23 Encounter for immunization: Secondary | ICD-10-CM | POA: Diagnosis not present

## 2016-05-22 DIAGNOSIS — E663 Overweight: Secondary | ICD-10-CM | POA: Diagnosis not present

## 2016-05-22 DIAGNOSIS — Z1389 Encounter for screening for other disorder: Secondary | ICD-10-CM | POA: Diagnosis not present

## 2016-05-22 DIAGNOSIS — E1165 Type 2 diabetes mellitus with hyperglycemia: Secondary | ICD-10-CM | POA: Diagnosis not present

## 2016-06-29 DIAGNOSIS — H40053 Ocular hypertension, bilateral: Secondary | ICD-10-CM | POA: Diagnosis not present

## 2016-06-29 DIAGNOSIS — H40013 Open angle with borderline findings, low risk, bilateral: Secondary | ICD-10-CM | POA: Diagnosis not present

## 2016-06-29 DIAGNOSIS — E113291 Type 2 diabetes mellitus with mild nonproliferative diabetic retinopathy without macular edema, right eye: Secondary | ICD-10-CM | POA: Diagnosis not present

## 2016-07-03 DIAGNOSIS — Z1231 Encounter for screening mammogram for malignant neoplasm of breast: Secondary | ICD-10-CM | POA: Diagnosis not present

## 2016-11-24 DIAGNOSIS — I10 Essential (primary) hypertension: Secondary | ICD-10-CM | POA: Diagnosis not present

## 2016-11-24 DIAGNOSIS — R252 Cramp and spasm: Secondary | ICD-10-CM | POA: Diagnosis not present

## 2016-11-24 DIAGNOSIS — E7800 Pure hypercholesterolemia, unspecified: Secondary | ICD-10-CM | POA: Diagnosis not present

## 2016-11-24 DIAGNOSIS — R809 Proteinuria, unspecified: Secondary | ICD-10-CM | POA: Diagnosis not present

## 2016-11-24 DIAGNOSIS — E1129 Type 2 diabetes mellitus with other diabetic kidney complication: Secondary | ICD-10-CM | POA: Diagnosis not present

## 2017-01-01 DIAGNOSIS — H40053 Ocular hypertension, bilateral: Secondary | ICD-10-CM | POA: Diagnosis not present

## 2017-01-01 DIAGNOSIS — H40013 Open angle with borderline findings, low risk, bilateral: Secondary | ICD-10-CM | POA: Diagnosis not present

## 2017-02-03 DIAGNOSIS — Z23 Encounter for immunization: Secondary | ICD-10-CM | POA: Diagnosis not present

## 2017-03-11 ENCOUNTER — Encounter (INDEPENDENT_AMBULATORY_CARE_PROVIDER_SITE_OTHER): Payer: Self-pay

## 2017-03-30 ENCOUNTER — Encounter (INDEPENDENT_AMBULATORY_CARE_PROVIDER_SITE_OTHER): Payer: Self-pay | Admitting: Family Medicine

## 2017-03-30 ENCOUNTER — Ambulatory Visit (INDEPENDENT_AMBULATORY_CARE_PROVIDER_SITE_OTHER): Payer: Medicare Other | Admitting: Family Medicine

## 2017-03-30 VITALS — BP 150/82 | HR 88 | Temp 97.7°F | Ht 63.0 in | Wt 170.0 lb

## 2017-03-30 DIAGNOSIS — E669 Obesity, unspecified: Secondary | ICD-10-CM | POA: Diagnosis not present

## 2017-03-30 DIAGNOSIS — Z6830 Body mass index (BMI) 30.0-30.9, adult: Secondary | ICD-10-CM

## 2017-03-30 DIAGNOSIS — R5383 Other fatigue: Secondary | ICD-10-CM

## 2017-03-30 DIAGNOSIS — Z1331 Encounter for screening for depression: Secondary | ICD-10-CM

## 2017-03-30 DIAGNOSIS — E559 Vitamin D deficiency, unspecified: Secondary | ICD-10-CM | POA: Diagnosis not present

## 2017-03-30 DIAGNOSIS — E119 Type 2 diabetes mellitus without complications: Secondary | ICD-10-CM | POA: Diagnosis not present

## 2017-03-30 DIAGNOSIS — I10 Essential (primary) hypertension: Secondary | ICD-10-CM

## 2017-03-30 DIAGNOSIS — Z0289 Encounter for other administrative examinations: Secondary | ICD-10-CM

## 2017-03-30 NOTE — Progress Notes (Signed)
.  Office: [phone]  /  Fax: [phone]   HPI:   Chief Complaint: OBESITY  Haley Wilcox (MR# [id number]) is a 84 y.o. female who presents on 03/30/2017 for obesity evaluation and treatment. Current BMI is Body mass index is 30.11 kg/m.Marland Kitchen Jermia has struggled with obesity for years and has been unsuccessful in either losing weight or maintaining long term weight loss. Alyssia attended our information session and states she is currently in the action stage of change and ready to dedicate time achieving and maintaining a healthier weight.  Tyreshia states her family eats meals together she thinks her family will eat healthier with  her her desired weight loss is 24 she started gaining weight after childbirth her heaviest weight ever was 188 lbs. she has significant food cravings issues  she snacks frequently in the evenings she frequently makes poor food choices she frequently eats larger portions than normal  she has binge eating behaviors she struggles with emotional eating    Fatigue Sheketa feels her energy is lower than it should be. This has worsened with weight gain and has not worsened recently. Gracen admits to daytime somnolence and admits to waking up still tired. Patient is at risk for obstructive sleep apnea. Patent has a history of symptoms of daytime fatigue and morning fatigue. Patient generally gets 3 or 4 hours of sleep per night, and states they generally have restless sleep. Snoring is present. Apneic episodes are not present. Epworth Sleepiness Score is 3  Diabetes II Shiasia has a diagnosis of diabetes type II. Elaynah states she is not checking blood sugar at home and she denies any hypoglycemic episodes. There is no recent Hgb A1c in Epic. She is attempting to work on intensive lifestyle modifications including diet, exercise, and weight loss to help control her blood glucose levels.  Hypertension Shabria JACQUELYNN FRIEND is a 84 y.o. female with hypertension. Her  blood pressure is elevated today at 150/82 and she states her blood pressure is normally controlled. Cadince ZURIA FOSDICK denies chest pain, headache or shortness of breath on exertion. She is attempting to work on weight loss to help control her blood pressure with the goal of decreasing her risk of heart attack and stroke. [name] blood pressure is not currently controlled.  Vitamin D deficiency Zoiey has a diagnosis of vitamin D deficiency. There are no recent labs and she is not currently taking vit D. Kimmie denies nausea, vomiting or muscle weakness.  Depression Screen Charlayne's Food and Mood (modified PHQ-9) score was  Depression screen PHQ 2/9 03/30/2017  Decreased Interest 0  Down, Depressed, Hopeless 0  PHQ - 2 Score 0  Altered sleeping 0  Tired, decreased energy 0  Change in appetite 1  Feeling bad or failure about yourself  0  Trouble concentrating 1  Moving slowly or fidgety/restless 0  Suicidal thoughts 0  PHQ-9 Score 2    ALLERGIES: Allergies  Allergen Reactions  . Fluoride Preparations Swelling    "lips swell" when brushing teeth with toothpaste that has flouride  . Ivp Dye [Iodinated Diagnostic Agents] Rash    MEDICATIONS: Current Outpatient Medications on File Prior to Visit  Medication Sig Dispense Refill  . amLODipine (NORVASC) 5 MG tablet Take 10 mg by mouth daily.     . cloNIDine (CATAPRES) 0.1 MG tablet Take 1 tablet (0.1 mg total) by mouth 2 (two) times daily. (Patient taking differently: Take 0.1 mg by mouth daily. ) 60 tablet 5  . colchicine 0.6 MG tablet Take  0.6 mg by mouth daily.    . diphenhydramine-acetaminophen (TYLENOL PM EXTRA STRENGTH) 25-500 MG TABS tablet Take 1 tablet by mouth at bedtime as needed.    . doxazosin (CARDURA) 2 MG tablet Take 2 mg by mouth daily.    Marland Kitchen losartan-hydrochlorothiazide (HYZAAR) 100-12.5 MG tablet Take 1 tablet by mouth daily.    . metFORMIN (GLUCOPHAGE) 500 MG tablet TAKE 1 TABLET DAILY WITH BREAKFAST 90 tablet 2  .  Multiple Vitamin (MULTIVITAMIN WITH MINERALS) TABS tablet Take 2 tablets by mouth daily.     Marland Kitchen omeprazole (PRILOSEC) 20 MG capsule TAKE 1 CAPSULE DAILY AS NEEDED (HEART BURN) 90 capsule 1  . polyethylene glycol powder (GLYCOLAX/MIRALAX) powder Mix 17gm in 4oz of water.  Start with 2 doses per day, but may take up to 6 doses per day.  Titrate number of doses to allow 2-3 soft bowel movements daily. 850 g 1   No current facility-administered medications on file prior to visit.     PAST MEDICAL HISTORY: Past Medical History:  Diagnosis Date  . Constipation   . Diabetes mellitus   . Gout   . Hyperlipidemia   . Hypertension     PAST SURGICAL HISTORY: Past Surgical History:  Procedure Laterality Date  . ABDOMINAL HYSTERECTOMY    . d &c    . DILATION AND CURETTAGE OF UTERUS    . EYE SURGERY     bilateral cataract  . TUBAL LIGATION      SOCIAL HISTORY: Social History   Tobacco Use  . Smoking status: Never Smoker  . Smokeless tobacco: Never Used  Substance Use Topics  . Alcohol use: No    Alcohol/week: 0.0 oz  . Drug use: No    FAMILY HISTORY: Family History  Problem Relation Age of Onset  . Hypertension Mother   . Heart disease Father     ROS: Review of Systems  Constitutional: Positive for malaise/fatigue.  HENT: Positive for hearing loss.   Respiratory: Negative for shortness of breath (on exertion).   Cardiovascular: Negative for chest pain.       Leg Cramping   Gastrointestinal: Positive for constipation. Negative for nausea and vomiting.  Musculoskeletal:       Negative muscle weakness  Neurological: Negative for headaches.  Endo/Heme/Allergies:       Negative hypoglycemia  Psychiatric/Behavioral: The patient has insomnia.     PHYSICAL EXAM: Blood pressure (!) 150/82, pulse 88, temperature 97.7 F (36.5 C), temperature source Oral, height 5\' 3"  (1.6 m), weight 170 lb (77.1 kg), SpO2 99 %. Body mass index is 30.11 kg/m. Physical Exam  Constitutional:  She is oriented to person, place, and time. She appears well-developed and well-nourished.  HENT:  Head: Normocephalic and atraumatic.  Nose: Nose normal.  Eyes: EOM are normal. No scleral icterus.  Neck: Normal range of motion. Neck supple. No thyromegaly present.  Cardiovascular: Normal rate and regular rhythm.  Pulmonary/Chest: Effort normal. No respiratory distress.  Abdominal: Soft. There is no tenderness.  + obesity  Musculoskeletal: Normal range of motion.  Range of Motion normal in all 4 extremities Trace edema noted in bilateral lower extremities  Neurological: She is alert and oriented to person, place, and time. Coordination normal.  Skin: Skin is warm and dry.  Psychiatric: She has a normal mood and affect. Her behavior is normal.  Vitals reviewed.   RECENT LABS AND TESTS: BMET    Component Value Date/Time   NA 140 08/15/2014 2358   K 3.7 08/15/2014 2358  CL 103 08/15/2014 2358   CO2 28 08/15/2014 2358   GLUCOSE 130 (H) 08/15/2014 2358   BUN 23 (H) 08/15/2014 2358   CREATININE 0.71 08/15/2014 2358   CALCIUM 9.6 08/15/2014 2358   GFRNONAA >60 08/15/2014 2358   GFRAA >60 08/15/2014 2358   Lab Results  Component Value Date   HGBA1C 6.5 04/16/2014   No results found for: INSULIN CBC    Component Value Date/Time   WBC 8.8 08/15/2014 2358   RBC 4.27 08/15/2014 2358   HGB 11.9 (L) 08/15/2014 2358   HCT 36.4 08/15/2014 2358   PLT 233 08/15/2014 2358   MCV 85.2 08/15/2014 2358   MCH 27.9 08/15/2014 2358   MCHC 32.7 08/15/2014 2358   RDW 14.6 08/15/2014 2358   LYMPHSABS 2.8 08/15/2014 2358   MONOABS 0.7 08/15/2014 2358   EOSABS 0.1 08/15/2014 2358   BASOSABS 0.1 08/15/2014 2358   Iron/TIBC/Ferritin/ %Sat    Component Value Date/Time   IRON 51 10/07/2012 1032   TIBC 192 (L) 06/03/2012 0443   FERRITIN 114 06/03/2012 0443   IRONPCTSAT 8 (L) 06/03/2012 0443   Lipid Panel     Component Value Date/Time   CHOL 228 (H) 04/16/2014 0935   TRIG 145.0  04/16/2014 0935   HDL 59.60 04/16/2014 0935   CHOLHDL 4 04/16/2014 0935   VLDL 29.0 04/16/2014 0935   LDLCALC 139 (H) 04/16/2014 0935   LDLDIRECT 140.8 10/07/2012 1032   Hepatic Function Panel     Component Value Date/Time   PROT 7.1 04/16/2014 0935   ALBUMIN 4.0 04/16/2014 0935   AST 15 04/16/2014 0935   ALT 9 04/16/2014 0935   ALKPHOS 78 04/16/2014 0935   BILITOT 0.3 04/16/2014 0935   BILIDIR 0.1 04/16/2014 0935      Component Value Date/Time   TSH 3.12 10/07/2012 1032   Vitamin D No recent labs  ECG  shows NSR with a rate of 85 BPM INDIRECT CALORIMETER done today shows a VO2 of 278 and a REE of 1937. Her calculated basal metabolic rate is 3329 thus her basal metabolic rate is better than expected.    ASSESSMENT AND PLAN: Other fatigue - Plan: EKG 12-Lead, CBC With Differential, T3, T4, free, TSH  Type 2 diabetes mellitus without complication, without long-term current use of insulin (HCC) - Plan: Comprehensive metabolic panel, Hemoglobin A1c, Insulin, random, Microalbumin / creatinine urine ratio  Essential hypertension - Plan: Lipid Panel With LDL/HDL Ratio  Vitamin D deficiency - Plan: VITAMIN D 25 Hydroxy (Vit-D Deficiency, Fractures)  Depression screening  Class 1 obesity with serious comorbidity and body mass index (BMI) of 30.0 to 30.9 in adult, unspecified obesity type  PLAN:  Fatigue Lucill was informed that her fatigue may be related to obesity, depression or many other causes. Labs will be ordered, and in the meanwhile Zamorah has agreed to work on diet, exercise and weight loss to help with fatigue. Proper sleep hygiene was discussed including the need for 7-8 hours of quality sleep each night. A sleep study was not ordered based on symptoms and Epworth score.  Diabetes II Baily has been given extensive diabetes education by myself today including ideal fasting and post-prandial blood glucose readings, individual ideal Hgb A1c goals and hypoglycemia  prevention. We discussed the importance of good blood sugar control to decrease the likelihood of diabetic complications such as nephropathy, neuropathy, limb loss, blindness, coronary artery disease, and death. We discussed the importance of intensive lifestyle modification including diet, exercise and weight loss as  the first line treatment for diabetes. Janila agrees to check her blood sugar daily and bring her log in. We will check labs and she agrees to work on diet and exercise. Joelly agrees to follow up with our clinic in 2 weeks.   Hypertension This may be white coat hypertension. We discussed sodium restriction, working on healthy weight loss, and a regular exercise program as the means to achieve improved blood pressure control. Kerston agreed with this plan. We will recheck blood pressure in 2 weeks and will continue to monitor her blood pressure as well as her progress with the above lifestyle modifications. She will continue her medications as prescribed and will watch for signs of hypotension as she continues her lifestyle modifications. We will check labs and she agrees to follow up with our clinic in 2 weeks.  Vitamin D Deficiency Arneisha was informed that low vitamin D levels contributes to fatigue and are associated with obesity, breast, and colon cancer. We will check labs and will follow up for routine testing of vitamin D, at least 2-3 times per year. Martavia agrees to follow up with our clinic in 2 weeks.  Depression Screen Delynn had a negative depression screening. Depression is commonly associated with obesity and often results in emotional eating behaviors. We will monitor this closely and work on CBT to help improve the non-hunger eating patterns. Referral to Psychology may be required if no improvement is seen as she continues in our clinic.  Obesity Darcell is currently in the action stage of change and her goal is to continue with weight loss efforts She has agreed to  follow the Category 2 plan Sharena has been instructed to work up to a goal of 150 minutes of combined cardio and strengthening exercise per week for weight loss and overall health benefits. We discussed the following Behavioral Modification Strategies today: increasing lean protein intake, decreasing simple carbohydrates  and work on meal planning and easy cooking plans  Samanthamarie has agreed to follow up with our clinic in 2 weeks. She was informed of the importance of frequent follow up visits to maximize her success with intensive lifestyle modifications for her multiple health conditions. She was informed we would discuss her lab results at her next visit unless there is a critical issue that needs to be addressed sooner. Symiah agreed to keep her next visit at the agreed upon time to discuss these results.    OBESITY BEHAVIORAL INTERVENTION VISIT  Today's visit was # 1 out of 22.  Starting weight: 170 lbs Starting date: 03/30/17 Today's weight : 170 lbs  Today's date: 03/30/2017 Total lbs lost to date: 0 (Patients must lose 7 lbs in the first 6 months to continue with counseling)   ASK: We discussed the diagnosis of obesity with Jamie Marisue [place] today and Korianna agreed to give [place] permission to discuss obesity behavioral modification therapy today.  ASSESS: Kaelie has the diagnosis of obesity and her BMI today is 30.12 Kaylah is in the action stage of change   ADVISE: Jouri was educated on the multiple health risks of obesity as well as the benefit of weight loss to improve her health. She was advised of the need for long term treatment and the importance of lifestyle modifications.  AGREE: Multiple dietary modification options and treatment options were discussed and  Jarielys agreed to the above obesity treatment plan.   I, Doreene Nest, am acting as transcriptionist for Dennard Nip, MD  I have reviewed the above documentation for  accuracy and completeness, and I agree  with the above. -Dennard Nip, MD

## 2017-03-31 LAB — COMPREHENSIVE METABOLIC PANEL
ALBUMIN: 4.3 g/dL (ref 3.5–4.7)
ALK PHOS: 87 IU/L (ref 39–117)
ALT: 18 IU/L (ref 0–32)
AST: 21 IU/L (ref 0–40)
Albumin/Globulin Ratio: 1.4 (ref 1.2–2.2)
BUN / CREAT RATIO: 21 (ref 12–28)
BUN: 13 mg/dL (ref 8–27)
Bilirubin Total: 0.3 mg/dL (ref 0.0–1.2)
CALCIUM: 9.7 mg/dL (ref 8.7–10.3)
CO2: 27 mmol/L (ref 20–29)
Chloride: 98 mmol/L (ref 96–106)
Creatinine, Ser: 0.61 mg/dL (ref 0.57–1.00)
GFR calc Af Amer: 97 mL/min/{1.73_m2} (ref >59)
GFR calc non Af Amer: 84 mL/min/{1.73_m2} (ref >59)
GLUCOSE: 108 mg/dL — AB (ref 65–99)
Globulin, Total: 3 g/dL (ref 1.5–4.5)
Potassium: 3.9 mmol/L (ref 3.5–5.2)
Sodium: 142 mmol/L (ref 134–144)
Total Protein: 7.3 g/dL (ref 6.0–8.5)

## 2017-03-31 LAB — VITAMIN D 25 HYDROXY (VIT D DEFICIENCY, FRACTURES): Vit D, 25-Hydroxy: 32.7 ng/mL (ref 30.0–100.0)

## 2017-03-31 LAB — TSH: TSH: 4.88 u[IU]/mL — AB (ref 0.450–4.500)

## 2017-03-31 LAB — HEMOGLOBIN A1C
Est. average glucose Bld gHb Est-mCnc: 151 mg/dL
HEMOGLOBIN A1C: 6.9 % — AB (ref 4.8–5.6)

## 2017-03-31 LAB — INSULIN, RANDOM: INSULIN: 15.4 u[IU]/mL (ref 2.6–24.9)

## 2017-03-31 LAB — T4, FREE: Free T4: 1.1 ng/dL (ref 0.82–1.77)

## 2017-03-31 LAB — T3: T3, Total: 127 ng/dL (ref 71–180)

## 2017-03-31 LAB — CBC WITH DIFFERENTIAL
BASOS ABS: 0 10*3/uL (ref 0.0–0.2)
Basos: 0 %
EOS (ABSOLUTE): 0.1 10*3/uL (ref 0.0–0.4)
EOS: 1 %
Hematocrit: 40.2 % (ref 34.0–46.6)
Hemoglobin: 13.8 g/dL (ref 11.1–15.9)
IMMATURE GRANS (ABS): 0.1 10*3/uL (ref 0.0–0.1)
Immature Granulocytes: 1 %
LYMPHS: 29 %
Lymphocytes Absolute: 2.1 10*3/uL (ref 0.7–3.1)
MCH: 28.2 pg (ref 26.6–33.0)
MCHC: 34.3 g/dL (ref 31.5–35.7)
MCV: 82 fL (ref 79–97)
Monocytes Absolute: 0.4 10*3/uL (ref 0.1–0.9)
Monocytes: 6 %
Neutrophils Absolute: 4.6 10*3/uL (ref 1.4–7.0)
Neutrophils: 63 %
RBC: 4.9 x10E6/uL (ref 3.77–5.28)
RDW: 15.3 % (ref 12.3–15.4)
WBC: 7.1 10*3/uL (ref 3.4–10.8)

## 2017-03-31 LAB — LIPID PANEL WITH LDL/HDL RATIO
Cholesterol, Total: 245 mg/dL — ABNORMAL HIGH (ref 100–199)
HDL: 61 mg/dL (ref >39)
LDL CALC: 154 mg/dL — AB (ref 0–99)
LDl/HDL Ratio: 2.5 ratio (ref 0.0–3.2)
Triglycerides: 151 mg/dL — ABNORMAL HIGH (ref 0–149)
VLDL Cholesterol Cal: 30 mg/dL (ref 5–40)

## 2017-03-31 LAB — MICROALBUMIN / CREATININE URINE RATIO
Creatinine, Urine: 26.8 mg/dL
MICROALBUM., U, RANDOM: 1927.8 ug/mL
Microalb/Creat Ratio: 7193.3 mg/g creat — ABNORMAL HIGH (ref 0.0–30.0)

## 2017-04-13 ENCOUNTER — Ambulatory Visit (INDEPENDENT_AMBULATORY_CARE_PROVIDER_SITE_OTHER): Payer: Medicare Other | Admitting: Family Medicine

## 2017-04-13 VITALS — BP 173/84 | HR 85 | Temp 97.9°F | Ht 63.0 in | Wt 165.0 lb

## 2017-04-13 DIAGNOSIS — I10 Essential (primary) hypertension: Secondary | ICD-10-CM

## 2017-04-13 DIAGNOSIS — E669 Obesity, unspecified: Secondary | ICD-10-CM

## 2017-04-13 DIAGNOSIS — Z6830 Body mass index (BMI) 30.0-30.9, adult: Secondary | ICD-10-CM

## 2017-04-13 DIAGNOSIS — K5909 Other constipation: Secondary | ICD-10-CM | POA: Diagnosis not present

## 2017-04-13 DIAGNOSIS — E119 Type 2 diabetes mellitus without complications: Secondary | ICD-10-CM | POA: Diagnosis not present

## 2017-04-13 DIAGNOSIS — R899 Unspecified abnormal finding in specimens from other organs, systems and tissues: Secondary | ICD-10-CM | POA: Diagnosis not present

## 2017-04-13 DIAGNOSIS — E559 Vitamin D deficiency, unspecified: Secondary | ICD-10-CM

## 2017-04-13 DIAGNOSIS — E66811 Obesity, class 1: Secondary | ICD-10-CM

## 2017-04-13 MED ORDER — VITAMIN D (ERGOCALCIFEROL) 1.25 MG (50000 UNIT) PO CAPS: 50000.0000 [IU] | ORAL_CAPSULE | ORAL | 0 refills | Status: DC

## 2017-04-13 NOTE — Progress Notes (Signed)
Office: [phone]  /  Fax: [phone]   HPI:   Chief Complaint: OBESITY Haley Wilcox is here to discuss her progress with her obesity treatment plan. She is on the Category 2 plan and is following her eating plan approximately 100 % of the time. She states she is exercising 0 minutes 0 times per week. Cody did well with weight loss, but she struggled to eat all her lean protein. She rarely eats meat and most days does not eat even 50% of her recommended protein. Her weight is 165 lb (74.8 kg) today and has had a weight loss of 5 pounds over a period of 2 weeks since her last visit. She has lost 5 lbs since starting treatment with [place].  Hypertension Haley Wilcox is a 84 y.o. female with hypertension. She is on Norvasc, Cardura, Hyzaar and Clonidine in the pm. Her blood pressure is very elevated today at 173/84. She has fluctuated blood pressure, significantly in the past. Tinzlee Marisue [place] denies chest pain or shortness of breath on exertion. She is working weight loss to help control her blood pressure with the goal of decreasing her risk of heart attack and stroke. [name] blood pressure is not currently controlled.  Diabetes II Haley Wilcox has a diagnosis of diabetes type II. She is on metformin and her last A1c was at 6.9, which is at goal, at her age. Meggen states fasting BGs range between 120 and 140, 2 hour post prandial range betweemn 103 and 130 and she denies any hypoglycemic episodes. Haley Wilcox has normal renal function with creatinine and BUN. She has been working on intensive lifestyle modifications including diet, exercise, and weight loss to help control her blood glucose levels.  Constipation Flavia started having worsening constipation on diet prescription. She has increased miralax and symptoms have improved. She denies hematochezia or melena.   Vitamin D deficiency Haley Wilcox has a diagnosis of vitamin D deficiency. She is not currently taking vit D and level is not yet at  goal. She denies nausea, vomiting or muscle weakness.   Ref. Range 03/30/2017 10:52  Vitamin D, 25-Hydroxy Latest Ref Range: 30.0 - 100.0 ng/mL 32.7   Abnormal Labs Haley Wilcox has a very elevated microalbuminin creatinine ratio in the 7,000 range, but she has a normal BUN and creatinine, subsequently lab error.  ALLERGIES: Allergies  Allergen Reactions  . Fluoride Preparations Swelling    "lips swell" when brushing teeth with toothpaste that has flouride  . Ivp Dye [Iodinated Diagnostic Agents] Rash    MEDICATIONS: Current Outpatient Medications on File Prior to Visit  Medication Sig Dispense Refill  . amLODipine (NORVASC) 5 MG tablet Take 10 mg by mouth daily.     . cloNIDine (CATAPRES) 0.1 MG tablet Take 1 tablet (0.1 mg total) by mouth 2 (two) times daily. (Patient taking differently: Take 0.1 mg by mouth daily. ) 60 tablet 5  . colchicine 0.6 MG tablet Take 0.6 mg by mouth daily.    . diphenhydramine-acetaminophen (TYLENOL PM EXTRA STRENGTH) 25-500 MG TABS tablet Take 1 tablet by mouth at bedtime as needed.    . doxazosin (CARDURA) 2 MG tablet Take 2 mg by mouth daily.    Marland Kitchen losartan-hydrochlorothiazide (HYZAAR) 100-12.5 MG tablet Take 1 tablet by mouth daily.    . metFORMIN (GLUCOPHAGE) 500 MG tablet TAKE 1 TABLET DAILY WITH BREAKFAST 90 tablet 2  . Multiple Vitamin (MULTIVITAMIN WITH MINERALS) TABS tablet Take 2 tablets by mouth daily.     Marland Kitchen omeprazole (PRILOSEC) 20 MG capsule TAKE  1 CAPSULE DAILY AS NEEDED (HEART BURN) 90 capsule 1  . polyethylene glycol powder (GLYCOLAX/MIRALAX) powder Mix 17gm in 4oz of water.  Start with 2 doses per day, but may take up to 6 doses per day.  Titrate number of doses to allow 2-3 soft bowel movements daily. 850 g 1   No current facility-administered medications on file prior to visit.     PAST MEDICAL HISTORY: Past Medical History:  Diagnosis Date  . Constipation   . Diabetes mellitus   . Gout   . Hyperlipidemia   . Hypertension     PAST  SURGICAL HISTORY: Past Surgical History:  Procedure Laterality Date  . ABDOMINAL HYSTERECTOMY    . d &c    . DILATION AND CURETTAGE OF UTERUS    . EYE SURGERY     bilateral cataract  . TUBAL LIGATION      SOCIAL HISTORY: Social History   Tobacco Use  . Smoking status: Never Smoker  . Smokeless tobacco: Never Used  Substance Use Topics  . Alcohol use: No    Alcohol/week: 0.0 oz  . Drug use: No    FAMILY HISTORY: Family History  Problem Relation Age of Onset  . Hypertension Mother   . Heart disease Father     ROS: Review of Systems  Constitutional: Positive for weight loss.  Respiratory: Negative for shortness of breath (on exertion).   Cardiovascular: Negative for chest pain.  Gastrointestinal: Positive for constipation. Negative for melena, nausea and vomiting.       Negative hematochezia  Musculoskeletal:       Negative muscle weakness    PHYSICAL EXAM: Blood pressure (!) 173/84, pulse 85, temperature 97.9 F (36.6 C), temperature source Oral, height 5\' 3"  (1.6 m), weight 165 lb (74.8 kg), SpO2 98 %. Body mass index is 29.23 kg/m. Physical Exam  Constitutional: She is oriented to person, place, and time. She appears well-developed and well-nourished.  Cardiovascular: Normal rate.  Pulmonary/Chest: Effort normal.  Musculoskeletal: Normal range of motion.  Neurological: She is oriented to person, place, and time.  Skin: Skin is warm and dry.  Psychiatric: She has a normal mood and affect. Her behavior is normal.  Vitals reviewed.   RECENT LABS AND TESTS: BMET    Component Value Date/Time   NA 142 03/30/2017 1052   K 3.9 03/30/2017 1052   CL 98 03/30/2017 1052   CO2 27 03/30/2017 1052   GLUCOSE 108 (H) 03/30/2017 1052   GLUCOSE 130 (H) 08/15/2014 2358   BUN 13 03/30/2017 1052   CREATININE 0.61 03/30/2017 1052   CALCIUM 9.7 03/30/2017 1052   GFRNONAA 84 03/30/2017 1052   GFRAA 97 03/30/2017 1052   Lab Results  Component Value Date   HGBA1C 6.9  (H) 03/30/2017   HGBA1C 6.5 04/16/2014   HGBA1C 6.2 10/17/2013   HGBA1C 6.5 04/10/2013   HGBA1C 6.7 (H) 10/07/2012   Lab Results  Component Value Date   INSULIN 15.4 03/30/2017   CBC    Component Value Date/Time   WBC 7.1 03/30/2017 1052   WBC 8.8 08/15/2014 2358   RBC 4.90 03/30/2017 1052   RBC 4.27 08/15/2014 2358   HGB 13.8 03/30/2017 1052   HCT 40.2 03/30/2017 1052   PLT 233 08/15/2014 2358   MCV 82 03/30/2017 1052   MCH 28.2 03/30/2017 1052   MCH 27.9 08/15/2014 2358   MCHC 34.3 03/30/2017 1052   MCHC 32.7 08/15/2014 2358   RDW 15.3 03/30/2017 1052   LYMPHSABS 2.1 03/30/2017  1052   MONOABS 0.7 08/15/2014 2358   EOSABS 0.1 03/30/2017 1052   BASOSABS 0.0 03/30/2017 1052   Iron/TIBC/Ferritin/ %Sat    Component Value Date/Time   IRON 51 10/07/2012 1032   TIBC 192 (L) 06/03/2012 0443   FERRITIN 114 06/03/2012 0443   IRONPCTSAT 8 (L) 06/03/2012 0443   Lipid Panel     Component Value Date/Time   CHOL 245 (H) 03/30/2017 1052   TRIG 151 (H) 03/30/2017 1052   HDL 61 03/30/2017 1052   CHOLHDL 4 04/16/2014 0935   VLDL 29.0 04/16/2014 0935   LDLCALC 154 (H) 03/30/2017 1052   LDLDIRECT 140.8 10/07/2012 1032   Hepatic Function Panel     Component Value Date/Time   PROT 7.3 03/30/2017 1052   ALBUMIN 4.3 03/30/2017 1052   AST 21 03/30/2017 1052   ALT 18 03/30/2017 1052   ALKPHOS 87 03/30/2017 1052   BILITOT 0.3 03/30/2017 1052   BILIDIR 0.1 04/16/2014 0935      Component Value Date/Time   TSH 4.880 (H) 03/30/2017 1052   TSH 3.12 10/07/2012 1032   TSH 1.94 07/23/2011 0944     Ref. Range 03/30/2017 10:52  Vitamin D, 25-Hydroxy Latest Ref Range: 30.0 - 100.0 ng/mL 32.7    ASSESSMENT AND PLAN: Essential hypertension  Type 2 diabetes mellitus without complication, without long-term current use of insulin ([facility])  Other constipation  Vitamin D deficiency - Plan: Vitamin D, Ergocalciferol, (DRISDOL) 50000 units CAPS capsule  Abnormal laboratory test  Class  1 obesity with serious comorbidity and body mass index (BMI) of 30.0 to 30.9 in adult, unspecified obesity type - Starting BMI greater then 30  PLAN:  Hypertension Ashleyanne Is likely rebounding from her alpha 1 medications and was advised to see her PCP this week, to adjust medications. We discussed sodium restriction, working on healthy weight loss, and a regular exercise program as the means to achieve improved blood pressure control. Jazalyn agreed with this plan and agreed to follow up as directed. We will continue to monitor her blood pressure as well as her progress with the above lifestyle modifications. She will continue her medications as prescribed and will watch for signs of hypotension as she continues her lifestyle modifications.  Diabetes II Mana has been given extensive diabetes education by myself today including ideal fasting and post-prandial blood glucose readings, individual ideal Hgb A1c goals and hypoglycemia prevention. We discussed the importance of good blood sugar control to decrease the likelihood of diabetic complications such as nephropathy, neuropathy, limb loss, blindness, coronary artery disease, and death. We discussed the importance of intensive lifestyle modification including diet, exercise and weight loss as the first line treatment for diabetes. Lethia agrees to continue metformin and will watch for hypoglycemia. Kelsy will follow up at the agreed upon time.  Constipation Vannah was informed decrease bowel movement frequency is normal while losing weight, but stools should not be hard or painful. She was advised to increase miralax to 2 times daily as needed. Belky agreed to follow up with our clinic in 2 weeks.  Vitamin D Deficiency Ninfa was informed that low vitamin D levels contributes to fatigue and are associated with obesity, breast, and colon cancer. She agrees to start to take prescription Vit D @50 ,000 IU every week #4 with no refills and will  follow up for routine testing of vitamin D, at least 2-3 times per year. She was informed of the risk of over-replacement of vitamin D and agrees to not increase her dose unless  she discusses this with [place] first. Benelli agrees to follow up with our clinic in 2 weeks.  Abnormal Labs We will recheck urine: MAB at her next visit and follow.  Obesity Kina is currently in the action stage of change. As such, her goal is to continue with weight loss efforts She has agreed to follow the Category 2 plan Osmara has been instructed to work up to a goal of 150 minutes of combined cardio and strengthening exercise per week for weight loss and overall health benefits. We discussed the following Behavioral Modification Strategies today: increase H2O intake, increasing lean protein intake and decreasing sodium intake  Meribeth has agreed to follow up with our clinic in 2 weeks. She was informed of the importance of frequent follow up visits to maximize her success with intensive lifestyle modifications for her multiple health conditions.   OBESITY BEHAVIORAL INTERVENTION VISIT  Today's visit was # 2 out of 22.  Starting weight: 170 lbs Starting date: 03/30/17 Today's weight : 165 lbs Today's date: 04/13/2017 Total lbs lost to date: 5 (Patients must lose 7 lbs in the first 6 months to continue with counseling)   ASK: We discussed the diagnosis of obesity with Raneisha Marisue [place] today and Kendal agreed to give [place] permission to discuss obesity behavioral modification therapy today.  ASSESS: Sinai has the diagnosis of obesity and her BMI today is 29.24 Remi is in the action stage of change   ADVISE: Lakeysha was educated on the multiple health risks of obesity as well as the benefit of weight loss to improve her health. She was advised of the need for long term treatment and the importance of lifestyle modifications.  AGREE: Multiple dietary modification options and treatment options were  discussed and  Kili agreed to the above obesity treatment plan.  I, Doreene Nest, am acting as transcriptionist for Dennard Nip, MD  I have reviewed the above documentation for accuracy and completeness, and I agree with the above. -Dennard Nip, MD

## 2017-04-15 DIAGNOSIS — I10 Essential (primary) hypertension: Secondary | ICD-10-CM | POA: Diagnosis not present

## 2017-04-27 ENCOUNTER — Ambulatory Visit (INDEPENDENT_AMBULATORY_CARE_PROVIDER_SITE_OTHER): Payer: Medicare Other | Admitting: Physician Assistant

## 2017-04-27 VITALS — BP 165/86 | HR 71 | Temp 97.7°F | Ht 63.0 in | Wt 166.0 lb

## 2017-04-27 DIAGNOSIS — R899 Unspecified abnormal finding in specimens from other organs, systems and tissues: Secondary | ICD-10-CM | POA: Diagnosis not present

## 2017-04-27 DIAGNOSIS — I10 Essential (primary) hypertension: Secondary | ICD-10-CM

## 2017-04-27 DIAGNOSIS — E669 Obesity, unspecified: Secondary | ICD-10-CM

## 2017-04-27 DIAGNOSIS — E119 Type 2 diabetes mellitus without complications: Secondary | ICD-10-CM

## 2017-04-27 DIAGNOSIS — Z6830 Body mass index (BMI) 30.0-30.9, adult: Secondary | ICD-10-CM

## 2017-04-27 NOTE — Progress Notes (Signed)
Office: [phone]  /  Fax: [phone]   HPI:   Chief Complaint: OBESITY Haley Wilcox is here to discuss her progress with her obesity treatment plan. She is on the Category 2 plan and is following her eating plan approximately 100 % of the time. She states she is exercising 0 minutes 0 times per week. Alijah is retaining fluids. She admits she has not been drinking enough water. She states he hunger is well controlled.  Her weight is 166 lb (75.3 kg) today and has gained 1 pound since her last visit. She has lost 4 lbs since starting treatment with [place].  Diabetes II Haley Wilcox has a diagnosis of diabetes type II. Haley Wilcox states fasting BGs range between 110 and 130's and post prandial range between 100 and 120's. She denies hypoglycemia. She is on metformin. Last A1c was 6.9 on 03/30/17. She has been working on intensive lifestyle modifications including diet, exercise, and weight loss to help control her blood glucose levels.  Hypertension Haley Wilcox is a 84 y.o. female with hypertension. Victory's blood pressure is elevated. Her blood pressure medications have been adjusted by her primary care physician recently due to elevated blood pressure. Lisinopril hydrochlorothiazide increased to 25 mg. She states blood pressure at home is also elevated and admits she has been increasing her sodium intake. Haley Wilcox denies chest pain or shortness of breath. She is working weight loss to help control her blood pressure with the goal of decreasing her risk of heart attack and stroke. Haley Wilcox's blood pressure is not currently controlled.  Abnormal Laboratory Test Haley Wilcox's urine: MAB is highly elevated, creatinine within normal limits and Hgb A1c at 6.9.  ALLERGIES: Allergies  Allergen Reactions  . Fluoride Preparations Swelling    "lips swell" when brushing teeth with toothpaste that has flouride  . Ivp Dye [Iodinated Diagnostic Agents] Rash    MEDICATIONS: Current Outpatient Medications on  File Prior to Visit  Medication Sig Dispense Refill  . amLODipine (NORVASC) 5 MG tablet Take 10 mg by mouth daily.     . cloNIDine (CATAPRES) 0.1 MG tablet Take 1 tablet (0.1 mg total) by mouth 2 (two) times daily. (Patient taking differently: Take 0.1 mg by mouth daily. ) 60 tablet 5  . colchicine 0.6 MG tablet Take 0.6 mg by mouth daily.    . diphenhydramine-acetaminophen (TYLENOL PM EXTRA STRENGTH) 25-500 MG TABS tablet Take 1 tablet by mouth at bedtime as needed.    . doxazosin (CARDURA) 2 MG tablet Take 2 mg by mouth daily.    Marland Kitchen losartan-hydrochlorothiazide (HYZAAR) 100-25 MG tablet Take 1 tablet by mouth daily.    . metFORMIN (GLUCOPHAGE) 500 MG tablet TAKE 1 TABLET DAILY WITH BREAKFAST 90 tablet 2  . Multiple Vitamin (MULTIVITAMIN WITH MINERALS) TABS tablet Take 2 tablets by mouth daily.     Marland Kitchen omeprazole (PRILOSEC) 20 MG capsule TAKE 1 CAPSULE DAILY AS NEEDED (HEART BURN) 90 capsule 1  . polyethylene glycol powder (GLYCOLAX/MIRALAX) powder Mix 17gm in 4oz of water.  Start with 2 doses per day, but may take up to 6 doses per day.  Titrate number of doses to allow 2-3 soft bowel movements daily. 850 g 1  . Vitamin D, Ergocalciferol, (DRISDOL) 50000 units CAPS capsule Take 1 capsule (50,000 Units total) by mouth every 7 (seven) days. 4 capsule 0   No current facility-administered medications on file prior to visit.     PAST MEDICAL HISTORY: Past Medical History:  Diagnosis Date  . Constipation   . Diabetes  mellitus   . Gout   . Hyperlipidemia   . Hypertension     PAST SURGICAL HISTORY: Past Surgical History:  Procedure Laterality Date  . ABDOMINAL HYSTERECTOMY    . d &c    . DILATION AND CURETTAGE OF UTERUS    . EYE SURGERY     bilateral cataract  . TUBAL LIGATION      SOCIAL HISTORY: Social History   Tobacco Use  . Smoking status: Never Smoker  . Smokeless tobacco: Never Used  Substance Use Topics  . Alcohol use: No    Alcohol/week: 0.0 oz  . Drug use: No     FAMILY HISTORY: Family History  Problem Relation Age of Onset  . Hypertension Mother   . Heart disease Father     ROS: Review of Systems  Constitutional: Negative for weight loss.  Respiratory: Negative for shortness of breath.   Cardiovascular: Negative for chest pain.  Endo/Heme/Allergies:       Negative hypoglycemia    PHYSICAL EXAM: Blood pressure (!) 165/86, pulse 71, temperature 97.7 F (36.5 C), temperature source Oral, height 5\' 3"  (1.6 m), weight 166 lb (75.3 kg), SpO2 99 %. Body mass index is 29.41 kg/m. Physical Exam  Constitutional: She is oriented to person, place, and time. She appears well-developed and well-nourished.  Cardiovascular: Normal rate.  Pulmonary/Chest: Effort normal.  Musculoskeletal: Normal range of motion.  Neurological: She is oriented to person, place, and time.  Skin: Skin is warm and dry.  Psychiatric: She has a normal mood and affect. Her behavior is normal.  Vitals reviewed.   RECENT LABS AND TESTS: BMET    Component Value Date/Time   NA 142 03/30/2017 1052   K 3.9 03/30/2017 1052   CL 98 03/30/2017 1052   CO2 27 03/30/2017 1052   GLUCOSE 108 (H) 03/30/2017 1052   GLUCOSE 130 (H) 08/15/2014 2358   BUN 13 03/30/2017 1052   CREATININE 0.61 03/30/2017 1052   CALCIUM 9.7 03/30/2017 1052   GFRNONAA 84 03/30/2017 1052   GFRAA 97 03/30/2017 1052   Lab Results  Component Value Date   HGBA1C 6.9 (H) 03/30/2017   HGBA1C 6.5 04/16/2014   HGBA1C 6.2 10/17/2013   HGBA1C 6.5 04/10/2013   HGBA1C 6.7 (H) 10/07/2012   Lab Results  Component Value Date   INSULIN 15.4 03/30/2017   CBC    Component Value Date/Time   WBC 7.1 03/30/2017 1052   WBC 8.8 08/15/2014 2358   RBC 4.90 03/30/2017 1052   RBC 4.27 08/15/2014 2358   HGB 13.8 03/30/2017 1052   HCT 40.2 03/30/2017 1052   PLT 233 08/15/2014 2358   MCV 82 03/30/2017 1052   MCH 28.2 03/30/2017 1052   MCH 27.9 08/15/2014 2358   MCHC 34.3 03/30/2017 1052   MCHC 32.7  08/15/2014 2358   RDW 15.3 03/30/2017 1052   LYMPHSABS 2.1 03/30/2017 1052   MONOABS 0.7 08/15/2014 2358   EOSABS 0.1 03/30/2017 1052   BASOSABS 0.0 03/30/2017 1052   Iron/TIBC/Ferritin/ %Sat    Component Value Date/Time   IRON 51 10/07/2012 1032   TIBC 192 (L) 06/03/2012 0443   FERRITIN 114 06/03/2012 0443   IRONPCTSAT 8 (L) 06/03/2012 0443   Lipid Panel     Component Value Date/Time   CHOL 245 (H) 03/30/2017 1052   TRIG 151 (H) 03/30/2017 1052   HDL 61 03/30/2017 1052   CHOLHDL 4 04/16/2014 0935   VLDL 29.0 04/16/2014 0935   LDLCALC 154 (H) 03/30/2017 1052  LDLDIRECT 140.8 10/07/2012 1032   Hepatic Function Panel     Component Value Date/Time   PROT 7.3 03/30/2017 1052   ALBUMIN 4.3 03/30/2017 1052   AST 21 03/30/2017 1052   ALT 18 03/30/2017 1052   ALKPHOS 87 03/30/2017 1052   BILITOT 0.3 03/30/2017 1052   BILIDIR 0.1 04/16/2014 0935      Component Value Date/Time   TSH 4.880 (H) 03/30/2017 1052   TSH 3.12 10/07/2012 1032   TSH 1.94 07/23/2011 0944    ASSESSMENT AND PLAN: Type 2 diabetes mellitus without complication, without long-term current use of insulin ([place]) - Plan: Microalbumin / creatinine urine ratio  Essential hypertension  Abnormal laboratory test  Class 1 obesity with serious comorbidity and body mass index (BMI) of 30.0 to 30.9 in adult, unspecified obesity type - Starting BMI greater then 30  PLAN:  Diabetes II Haley Wilcox has been given extensive diabetes education by myself today including ideal fasting and post-prandial blood glucose readings, individual ideal HgA1c goals  and hypoglycemia prevention. We discussed the importance of good blood sugar control to decrease the likelihood of diabetic complications such as nephropathy, neuropathy, limb loss, blindness, coronary artery disease, and death. We discussed the importance of intensive lifestyle modification including diet, exercise and weight loss as the first line treatment for diabetes.  Haley Wilcox agrees to continue her diabetes medications and Haley Wilcox agrees to follow up with our clinic in 2 weeks..  Hypertension We discussed sodium restriction, working on healthy weight loss, and a regular exercise program as the means to achieve improved blood pressure control. Haley Wilcox agreed with this plan and agreed to follow up as directed. We will continue to monitor her blood pressure as well as her progress with the above lifestyle modifications. She will continue her medications as prescribed and will watch for signs of hypotension as she continues her lifestyle modifications. Haley Wilcox agrees to follow up with our clinic in 2 weeks.  Abnormal Laboratory Test We will repeat urine: MAB and Haley Wilcox agrees to follow up with our clinic in 2 weeks.  Obesity Haley Wilcox is currently in the action stage of change. As such, her goal is to continue with weight loss efforts She has agreed to follow the Category 2 plan Haley Wilcox has been instructed to work up to a goal of 150 minutes of combined cardio and strengthening exercise per week for weight loss and overall health benefits. We discussed the following Behavioral Modification Strategies today: increasing lean protein intake, decreasing sodium intake and work on meal planning and easy cooking plans   Haley Wilcox has agreed to follow up with our clinic in 2 weeks. She was informed of the importance of frequent follow up visits to maximize her success with intensive lifestyle modifications for her multiple health conditions.   OBESITY BEHAVIORAL INTERVENTION VISIT  Today's visit was # 3 out of 22.  Starting weight: 170 lbs Starting date: 03/30/17 Today's weight: 166 lbs  Today's date: 04/27/2017 Total lbs lost to date: 4 (Patients must lose 7 lbs in the first 6 months to continue with counseling)   ASK: We discussed the diagnosis of obesity with Haley Wilcox today and Haley Wilcox agreed to give [place] permission to discuss obesity behavioral  modification therapy today.  ASSESS: Haley Wilcox has the diagnosis of obesity and her BMI today is 29.41 Haley Wilcox is in the action stage of change   ADVISE: Haley Wilcox was educated on the multiple health risks of obesity as well as the benefit of weight loss to improve her health.  She was advised of the need for long term treatment and the importance of lifestyle modifications.  AGREE: Multiple dietary modification options and treatment options were discussed and  Bama agreed to the above obesity treatment plan.   Wilhemena Durie, am acting as transcriptionist for Lacy Duverney, PA-C I, Lacy Duverney [facility], have reviewed this note and agree with its content.

## 2017-04-28 LAB — MICROALBUMIN / CREATININE URINE RATIO
Creatinine, Urine: 49.3 mg/dL
Microalb/Creat Ratio: 1986.2 mg/g creat — ABNORMAL HIGH (ref 0.0–30.0)
Microalbumin, Urine: 979.2 ug/mL

## 2017-05-11 ENCOUNTER — Ambulatory Visit (INDEPENDENT_AMBULATORY_CARE_PROVIDER_SITE_OTHER): Payer: Medicare Other | Admitting: Physician Assistant

## 2017-05-11 VITALS — BP 147/76 | HR 74 | Temp 97.8°F | Ht 63.0 in | Wt 163.0 lb

## 2017-05-11 DIAGNOSIS — Z6830 Body mass index (BMI) 30.0-30.9, adult: Secondary | ICD-10-CM

## 2017-05-11 DIAGNOSIS — I10 Essential (primary) hypertension: Secondary | ICD-10-CM | POA: Diagnosis not present

## 2017-05-11 DIAGNOSIS — E669 Obesity, unspecified: Secondary | ICD-10-CM

## 2017-05-11 NOTE — Progress Notes (Signed)
Office: [phone]  /  Fax: [phone]   HPI:   Chief Complaint: OBESITY Haley Wilcox is here to discuss her progress with her obesity treatment plan. She is on the  follow the Category 2 plan and is following her eating plan approximately 95 % of the time. She states she is not exercising. Haley Wilcox continues to do well with weight loss. She would like ideas about making better food choices when she eats out.  Her weight is 163 lb (73.9 kg) today and has had a weight loss of 3 pounds over a period of 2 weeks since her last visit. She has lost 7 lbs since starting treatment with [place].  Hypertension Haley Wilcox is a 84 y.o. female with hypertension.  Haley Wilcox denies chest pain or shortness of breath on exertion. She is working weight loss to help control her blood pressure with the goal of decreasing her risk of heart attack and stroke. Haley Wilcox blood pressure is not currently controlled. She relays her blood pressure today is improved from previous after recent medication adjustment. Advised her to keep a blood pressure log and bring in for review.     ALLERGIES: Allergies  Allergen Reactions  . Fluoride Preparations Swelling    "lips swell" when brushing teeth with toothpaste that has flouride  . Ivp Dye [Iodinated Diagnostic Agents] Rash    MEDICATIONS: Current Outpatient Medications on File Prior to Visit  Medication Sig Dispense Refill  . amLODipine (NORVASC) 5 MG tablet Take 10 mg by mouth daily.     . cloNIDine (CATAPRES) 0.1 MG tablet Take 1 tablet (0.1 mg total) by mouth 2 (two) times daily. (Patient taking differently: Take 0.1 mg by mouth daily. ) 60 tablet 5  . colchicine 0.6 MG tablet Take 0.6 mg by mouth daily.    . diphenhydramine-acetaminophen (TYLENOL PM EXTRA STRENGTH) 25-500 MG TABS tablet Take 1 tablet by mouth at bedtime as needed.    . doxazosin (CARDURA) 2 MG tablet Take 2 mg by mouth daily.    Marland Kitchen losartan-hydrochlorothiazide (HYZAAR) 100-25 MG tablet  Take 1 tablet by mouth daily.    . metFORMIN (GLUCOPHAGE) 500 MG tablet TAKE 1 TABLET DAILY WITH BREAKFAST 90 tablet 2  . Multiple Vitamin (MULTIVITAMIN WITH MINERALS) TABS tablet Take 2 tablets by mouth daily.     Marland Kitchen omeprazole (PRILOSEC) 20 MG capsule TAKE 1 CAPSULE DAILY AS NEEDED (HEART BURN) 90 capsule 1  . polyethylene glycol powder (GLYCOLAX/MIRALAX) powder Mix 17gm in 4oz of water.  Start with 2 doses per day, but may take up to 6 doses per day.  Titrate number of doses to allow 2-3 soft bowel movements daily. 850 g 1  . Vitamin D, Ergocalciferol, (DRISDOL) 50000 units CAPS capsule Take 1 capsule (50,000 Units total) by mouth every 7 (seven) days. 4 capsule 0   No current facility-administered medications on file prior to visit.     PAST MEDICAL HISTORY: Past Medical History:  Diagnosis Date  . Constipation   . Diabetes mellitus   . Gout   . Hyperlipidemia   . Hypertension     PAST SURGICAL HISTORY: Past Surgical History:  Procedure Laterality Date  . ABDOMINAL HYSTERECTOMY    . d &c    . DILATION AND CURETTAGE OF UTERUS    . EYE SURGERY     bilateral cataract  . TUBAL LIGATION      SOCIAL HISTORY: Social History   Tobacco Use  . Smoking status: Never Smoker  . Smokeless  tobacco: Never Used  Substance Use Topics  . Alcohol use: No    Alcohol/week: 0.0 oz  . Drug use: No    FAMILY HISTORY: Family History  Problem Relation Age of Onset  . Hypertension Mother   . Heart disease Father     ROS: Review of Systems  Constitutional: Positive for weight loss.  Respiratory: Negative for shortness of breath.   Cardiovascular: Negative for chest pain.    PHYSICAL EXAM: Blood pressure (!) 147/76, pulse 74, temperature 97.8 F (36.6 C), temperature source Oral, height 5\' 3"  (1.6 m), weight 163 lb (73.9 kg), SpO2 98 %. Body mass index is 28.87 kg/m. Physical Exam  Constitutional: She is oriented to person, place, and time. She appears well-developed and  well-nourished.  HENT:  Head: Normocephalic.  Eyes: EOM are normal.  Neck: Normal range of motion.  Cardiovascular: Normal rate.  Pulmonary/Chest: Effort normal.  Musculoskeletal: Normal range of motion.  Neurological: She is alert and oriented to person, place, and time.  Skin: Skin is warm and dry.  Psychiatric: She has a normal mood and affect. Her behavior is normal.    RECENT LABS AND TESTS: BMET    Component Value Date/Time   NA 142 03/30/2017 1052   K 3.9 03/30/2017 1052   CL 98 03/30/2017 1052   CO2 27 03/30/2017 1052   GLUCOSE 108 (H) 03/30/2017 1052   GLUCOSE 130 (H) 08/15/2014 2358   BUN 13 03/30/2017 1052   CREATININE 0.61 03/30/2017 1052   CALCIUM 9.7 03/30/2017 1052   GFRNONAA 84 03/30/2017 1052   GFRAA 97 03/30/2017 1052   Lab Results  Component Value Date   HGBA1C 6.9 (H) 03/30/2017   HGBA1C 6.5 04/16/2014   HGBA1C 6.2 10/17/2013   HGBA1C 6.5 04/10/2013   HGBA1C 6.7 (H) 10/07/2012   Lab Results  Component Value Date   INSULIN 15.4 03/30/2017   CBC    Component Value Date/Time   WBC 7.1 03/30/2017 1052   WBC 8.8 08/15/2014 2358   RBC 4.90 03/30/2017 1052   RBC 4.27 08/15/2014 2358   HGB 13.8 03/30/2017 1052   HCT 40.2 03/30/2017 1052   PLT 233 08/15/2014 2358   MCV 82 03/30/2017 1052   MCH 28.2 03/30/2017 1052   MCH 27.9 08/15/2014 2358   MCHC 34.3 03/30/2017 1052   MCHC 32.7 08/15/2014 2358   RDW 15.3 03/30/2017 1052   LYMPHSABS 2.1 03/30/2017 1052   MONOABS 0.7 08/15/2014 2358   EOSABS 0.1 03/30/2017 1052   BASOSABS 0.0 03/30/2017 1052   Iron/TIBC/Ferritin/ %Sat    Component Value Date/Time   IRON 51 10/07/2012 1032   TIBC 192 (L) 06/03/2012 0443   FERRITIN 114 06/03/2012 0443   IRONPCTSAT 8 (L) 06/03/2012 0443   Lipid Panel     Component Value Date/Time   CHOL 245 (H) 03/30/2017 1052   TRIG 151 (H) 03/30/2017 1052   HDL 61 03/30/2017 1052   CHOLHDL 4 04/16/2014 0935   VLDL 29.0 04/16/2014 0935   LDLCALC 154 (H) 03/30/2017  1052   LDLDIRECT 140.8 10/07/2012 1032   Hepatic Function Panel     Component Value Date/Time   PROT 7.3 03/30/2017 1052   ALBUMIN 4.3 03/30/2017 1052   AST 21 03/30/2017 1052   ALT 18 03/30/2017 1052   ALKPHOS 87 03/30/2017 1052   BILITOT 0.3 03/30/2017 1052   BILIDIR 0.1 04/16/2014 0935      Component Value Date/Time   TSH 4.880 (H) 03/30/2017 1052   TSH 3.12 10/07/2012  1032   TSH 1.94 07/23/2011 0944    ASSESSMENT AND PLAN: Essential hypertension  Class 1 obesity with serious comorbidity and body mass index (BMI) of 30.0 to 30.9 in adult, unspecified obesity type - Starting BMI greater then 30  PLAN: Hypertension We discussed sodium restriction, working on healthy weight loss, and a regular exercise program as the means to achieve improved blood pressure control. Haley Wilcox agreed with this plan and agreed to follow up as directed. We will continue to monitor her blood pressure as well as her progress with the above lifestyle modifications. She will continue her medications as prescribed and will watch for signs of hypotension as she continues her lifestyle modifications.  We spent > than 50% of the 15 minute visit on the counseling as documented in the note.  Obesity Haley Wilcox is currently in the action stage of change. As such, her goal is to continue with weight loss efforts She has agreed to keep a food journal with 500 calories and 35 grams of  protein at supper and follow the Category 2 plan Haley Wilcox has been instructed to work up to a goal of 150 minutes of combined cardio and strengthening exercise per week for weight loss and overall health benefits. We discussed the following Behavioral Modification Strategies today: increasing lean protein intake and keep a strict food journal.    Karesha has agreed to follow up with our clinic in 2 weeks. She was informed of the importance of frequent follow up visits to maximize her success with intensive lifestyle modifications for  her multiple health conditions.    Today's visit was # 4 out of 22.  Starting weight: 170 lb Starting date: 03/30/17 Today's weight : 163 lb Today's date: 05/11/2017 Total lbs lost to date: 7 (Patients must lose 7 lbs in the first 6 months to continue with counseling)   ASK: We discussed the diagnosis of obesity with Haley Wilcox today and Haley Wilcox agreed to give [place] permission to discuss obesity behavioral modification therapy today.  ASSESS: Haley Wilcox has the diagnosis of obesity and her BMI today is 28.87 Haley Wilcox is in the action stage of change   ADVISE: Haley Wilcox was educated on the multiple health risks of obesity as well as the benefit of weight loss to improve her health. She was advised of the need for long term treatment and the importance of lifestyle modifications.  AGREE: Multiple dietary modification options and treatment options were discussed and  Haley Wilcox agreed to the above obesity treatment plan.   Haley Wilcox, am acting as transcriptionist for [employer], PA-C I, Lacy Duverney [facility], have reviewed this note and agree with its content

## 2017-05-25 ENCOUNTER — Ambulatory Visit (INDEPENDENT_AMBULATORY_CARE_PROVIDER_SITE_OTHER): Payer: Medicare Other | Admitting: Physician Assistant

## 2017-05-25 VITALS — BP 170/86 | HR 72 | Temp 98.1°F | Ht 63.0 in | Wt 163.0 lb

## 2017-05-25 DIAGNOSIS — E559 Vitamin D deficiency, unspecified: Secondary | ICD-10-CM | POA: Diagnosis not present

## 2017-05-25 DIAGNOSIS — I10 Essential (primary) hypertension: Secondary | ICD-10-CM

## 2017-05-25 DIAGNOSIS — R809 Proteinuria, unspecified: Secondary | ICD-10-CM | POA: Diagnosis not present

## 2017-05-25 DIAGNOSIS — Z9189 Other specified personal risk factors, not elsewhere classified: Secondary | ICD-10-CM | POA: Diagnosis not present

## 2017-05-25 DIAGNOSIS — Z6830 Body mass index (BMI) 30.0-30.9, adult: Secondary | ICD-10-CM

## 2017-05-25 DIAGNOSIS — E669 Obesity, unspecified: Secondary | ICD-10-CM | POA: Diagnosis not present

## 2017-05-25 MED ORDER — VITAMIN D (ERGOCALCIFEROL) 1.25 MG (50000 UNIT) PO CAPS: 50000.0000 [IU] | ORAL_CAPSULE | ORAL | 0 refills | Status: DC

## 2017-05-25 NOTE — Progress Notes (Signed)
Office: [phone]  /  Fax: [phone]   HPI:   Chief Complaint: OBESITY Haley Wilcox is here to discuss her progress with her obesity treatment plan. She is on the keep a food journal with 500 calories and 35 grams of protein at supper daily and follow the Category 2 plan and is following her eating plan approximately 96 % of the time. She states she is walking the dog 7 times per week. Haley Wilcox maintained her weight. She states she has been following the meal plan and states her hunger is well controlled.  Her weight is 163 lb (73.9 kg) today and has not lost weight since her last visit. She has lost 7 lbs since starting treatment with [place].  Vitamin D Deficiency Haley Wilcox has a diagnosis of vitamin D deficiency. She is currently taking prescription Vit D and denies nausea, vomiting or muscle weakness.  Hypertension Haley Wilcox Haley Wilcox is a 84 y.o. female with hypertension. Haley Wilcox's blood pressure is elevated on multiple visits in our office.  She states BP is persistently elevated. She is currently on amlodipine, clonidine 0.1 mg BID (pt takes once daily), doxazosin, losartan, HCTZ.  Has been on  trandolapril, verapamil and Lasix in the past. She developed an allergic reaction to BP med, she is unsure of name. Have requested records from Dr. Delfina Redwood. Haley Wilcox denies chest pain or shortness of breath. She is working on weight loss to help control her blood pressure with the goal of decreasing her risk of heart attack and stroke.   Grade A3 Albuminuria [place] has a severely increased microalbumin/Creatinine ratio on 2 occasions, 1,986 on 04/27/17 and 7,193 on 03/30/17. She has PMH of DM II, last Hgb Alc 6.9 on 03/30/17, with normal GFR. Has HTN on multiple meds, poorly controlled (see note above).  She denies any urinary urgency, frequency, hematuria , dysuria,, flank pain, edema.   At risk for cardiovascular disease Haley Wilcox is at a higher than average risk for cardiovascular disease due to obesity and  hypertension. She currently denies any chest pain.  ALLERGIES: Allergies  Allergen Reactions  . Fluoride Preparations Swelling    "lips swell" when brushing teeth with toothpaste that has flouride  . Ivp Dye [Iodinated Diagnostic Agents] Rash    MEDICATIONS: Current Outpatient Medications on File Prior to Visit  Medication Sig Dispense Refill  . amLODipine (NORVASC) 5 MG tablet Take 10 mg by mouth daily.     . cloNIDine (CATAPRES) 0.1 MG tablet Take 1 tablet (0.1 mg total) by mouth 2 (two) times daily. (Patient taking differently: Take 0.1 mg by mouth daily. ) 60 tablet 5  . colchicine 0.6 MG tablet Take 0.6 mg by mouth daily.    . diphenhydramine-acetaminophen (TYLENOL PM EXTRA STRENGTH) 25-500 MG TABS tablet Take 1 tablet by mouth at bedtime as needed.    . doxazosin (CARDURA) 2 MG tablet Take 2 mg by mouth daily.    Marland Kitchen losartan-hydrochlorothiazide (HYZAAR) 100-25 MG tablet Take 1 tablet by mouth daily.    . metFORMIN (GLUCOPHAGE) 500 MG tablet TAKE 1 TABLET DAILY WITH BREAKFAST 90 tablet 2  . Multiple Vitamin (MULTIVITAMIN WITH MINERALS) TABS tablet Take 2 tablets by mouth daily.     Marland Kitchen omeprazole (PRILOSEC) 20 MG capsule TAKE 1 CAPSULE DAILY AS NEEDED (HEART BURN) 90 capsule 1  . polyethylene glycol powder (GLYCOLAX/MIRALAX) powder Mix 17gm in 4oz of water.  Start with 2 doses per day, but may take up to 6 doses per day.  Titrate number of doses to allow  2-3 soft bowel movements daily. 850 g 1  . Vitamin D, Ergocalciferol, (DRISDOL) 50000 units CAPS capsule Take 1 capsule (50,000 Units total) by mouth every 7 (seven) days. 4 capsule 0   No current facility-administered medications on file prior to visit.     PAST MEDICAL HISTORY: Past Medical History:  Diagnosis Date  . Constipation   . Diabetes mellitus   . Gout   . Hyperlipidemia   . Hypertension     PAST SURGICAL HISTORY: Past Surgical History:  Procedure Laterality Date  . ABDOMINAL HYSTERECTOMY    . d &c    .  DILATION AND CURETTAGE OF UTERUS    . EYE SURGERY     bilateral cataract  . TUBAL LIGATION      SOCIAL HISTORY: Social History   Tobacco Use  . Smoking status: Never Smoker  . Smokeless tobacco: Never Used  Substance Use Topics  . Alcohol use: No    Alcohol/week: 0.0 oz  . Drug use: No    FAMILY HISTORY: Family History  Problem Relation Age of Onset  . Hypertension Mother   . Heart disease Father     ROS: Review of Systems  Constitutional: Negative for weight loss.  Respiratory: Negative for shortness of breath.   Cardiovascular: Negative for chest pain and leg swelling.  Gastrointestinal: Negative for nausea and vomiting.  Genitourinary: Negative for dysuria, flank pain, frequency, hematuria and urgency.  Musculoskeletal:       Negative muscle weakness    PHYSICAL EXAM: Blood pressure (!) 170/86, pulse 72, temperature 98.1 F (36.7 C), temperature source Oral, height 5\' 3"  (1.6 m), weight 163 lb (73.9 kg), SpO2 100 %. Body mass index is 28.87 kg/m. Physical Exam  Constitutional: She is oriented to person, place, and time. She appears well-developed and well-nourished.  Cardiovascular: Normal rate.  Pulmonary/Chest: Effort normal.  Musculoskeletal: Normal range of motion.  Neurological: She is oriented to person, place, and time.  Skin: Skin is warm and dry.  Psychiatric: She has a normal mood and affect. Her behavior is normal.  Vitals reviewed.   RECENT LABS AND TESTS: BMET    Component Value Date/Time   NA 142 03/30/2017 1052   K 3.9 03/30/2017 1052   CL 98 03/30/2017 1052   CO2 27 03/30/2017 1052   GLUCOSE 108 (H) 03/30/2017 1052   GLUCOSE 130 (H) 08/15/2014 2358   BUN 13 03/30/2017 1052   CREATININE 0.61 03/30/2017 1052   CALCIUM 9.7 03/30/2017 1052   GFRNONAA 84 03/30/2017 1052   GFRAA 97 03/30/2017 1052   Lab Results  Component Value Date   HGBA1C 6.9 (H) 03/30/2017   HGBA1C 6.5 04/16/2014   HGBA1C 6.2 10/17/2013   HGBA1C 6.5 04/10/2013     HGBA1C 6.7 (H) 10/07/2012   Lab Results  Component Value Date   INSULIN 15.4 03/30/2017   CBC    Component Value Date/Time   WBC 7.1 03/30/2017 1052   WBC 8.8 08/15/2014 2358   RBC 4.90 03/30/2017 1052   RBC 4.27 08/15/2014 2358   HGB 13.8 03/30/2017 1052   HCT 40.2 03/30/2017 1052   PLT 233 08/15/2014 2358   MCV 82 03/30/2017 1052   MCH 28.2 03/30/2017 1052   MCH 27.9 08/15/2014 2358   MCHC 34.3 03/30/2017 1052   MCHC 32.7 08/15/2014 2358   RDW 15.3 03/30/2017 1052   LYMPHSABS 2.1 03/30/2017 1052   MONOABS 0.7 08/15/2014 2358   EOSABS 0.1 03/30/2017 1052   BASOSABS 0.0 03/30/2017 1052  Iron/TIBC/Ferritin/ %Sat    Component Value Date/Time   IRON 51 10/07/2012 1032   TIBC 192 (L) 06/03/2012 0443   FERRITIN 114 06/03/2012 0443   IRONPCTSAT 8 (L) 06/03/2012 0443   Lipid Panel     Component Value Date/Time   CHOL 245 (H) 03/30/2017 1052   TRIG 151 (H) 03/30/2017 1052   HDL 61 03/30/2017 1052   CHOLHDL 4 04/16/2014 0935   VLDL 29.0 04/16/2014 0935   LDLCALC 154 (H) 03/30/2017 1052   LDLDIRECT 140.8 10/07/2012 1032   Hepatic Function Panel     Component Value Date/Time   PROT 7.3 03/30/2017 1052   ALBUMIN 4.3 03/30/2017 1052   AST 21 03/30/2017 1052   ALT 18 03/30/2017 1052   ALKPHOS 87 03/30/2017 1052   BILITOT 0.3 03/30/2017 1052   BILIDIR 0.1 04/16/2014 0935      Component Value Date/Time   TSH 4.880 (H) 03/30/2017 1052   TSH 3.12 10/07/2012 1032   TSH 1.94 07/23/2011 0944  Results for DAWANDA, MAPEL (MRN [id number]) as of 05/25/2017 12:08  Ref. Range 03/30/2017 10:52  Vitamin D, 25-Hydroxy Latest Ref Range: 30.0 - 100.0 ng/mL 32.7    ASSESSMENT AND PLAN: Vitamin D deficiency - Plan: Vitamin D, Ergocalciferol, (DRISDOL) 50000 units CAPS capsule  Essential hypertension  At risk for heart disease  Class 1 obesity with serious comorbidity and body mass index (BMI) of 30.0 to 30.9 in adult, unspecified obesity type - Starting bMI greater then  30  PLAN:  Vitamin D Deficiency Haley Wilcox was informed that low vitamin D levels contributes to fatigue and are associated with obesity, breast, and colon cancer. Haley Wilcox agrees to continue taking prescription Vit D @50 ,000 IU every week #4 and we will refill for 1 month. She will follow up for routine testing of vitamin D, at least 2-3 times per year. She was informed of the risk of over-replacement of vitamin D and agrees to not increase her dose unless she discusses this with [place] first. Haley Wilcox agrees to follow up with our clinic in 2 weeks.  Hypertension Haley Wilcox is to follow up with her primary care provider on scheduled visit on Friday 05/28/17 for further management of her HTN. Haley Wilcox is informed that better BP control is essential to prevent progression of renal disease.  Haley Wilcox agreed with this plan and agreed to follow up as directed. We will continue to monitor her blood pressure as well as her progress with the above lifestyle modifications. She will continue her medications as prescribed and will watch for signs of hypotension as she continues her lifestyle modifications.    Grade A3 albuminuria Haley Wilcox was informed that severely elevated albuminuria increases her risk of kidney disease, heart disease, stroke, and death. She is encouraged to follow up with her primary care provider on scheduled appointment on 05/28/17 for further management of her HTN, and renal disease and evaluation for need for referral to nephrologist.   Cardiovascular risk counselling Haley Wilcox was given extended (15 minutes) coronary artery disease prevention counseling today. She is 84 y.o. female and has risk factors for heart disease including obesity and hypertension. We discussed intensive lifestyle modifications today with an emphasis on specific weight loss instructions and strategies. Pt was also informed of the importance of increasing exercise and decreasing saturated fats to help prevent heart  disease.  Obesity Haley Wilcox is currently in the action stage of change. As such, her goal is to continue with weight loss efforts She has agreed to keep a food journal  with 500 calories and 35 grams of protein at supper daily and follow the Category 2 plan Haley Wilcox has been instructed to work up to a goal of 150 minutes of combined cardio and strengthening exercise per week for weight loss and overall health benefits. We discussed the following Behavioral Modification Strategies today: increasing lean protein intake and work on meal planning and easy cooking plans   Haley Wilcox has agreed to follow up with our clinic in 2 weeks. She was informed of the importance of frequent follow up visits to maximize her success with intensive lifestyle modifications for her multiple health conditions.   OBESITY BEHAVIORAL INTERVENTION VISIT  Today's visit was # 5 out of 22.  Starting weight: 170 lbs Starting date: 03/30/17 Today's weight : 163 lbs Today's date: 05/25/2017 Total lbs lost to date: 7 (Patients must lose 7 lbs in the first 6 months to continue with counseling)   ASK: We discussed the diagnosis of obesity with Haley Wilcox today and Rea agreed to give [place] permission to discuss obesity behavioral modification therapy today.  ASSESS: Haley Wilcox has the diagnosis of obesity and her BMI today is 28.88 Haley Wilcox is in the action stage of change   ADVISE: Haley Wilcox was educated on the multiple health risks of obesity as well as the benefit of weight loss to improve her health. She was advised of the need for long term treatment and the importance of lifestyle modifications.  AGREE: Multiple dietary modification options and treatment options were discussed and  Haley Wilcox agreed to the above obesity treatment plan.   Wilhemena Durie, am acting as transcriptionist for Lacy Duverney, PA-C I, Lacy Duverney [facility], have reviewed this note and agree with its content

## 2017-05-28 DIAGNOSIS — Z1389 Encounter for screening for other disorder: Secondary | ICD-10-CM | POA: Diagnosis not present

## 2017-05-28 DIAGNOSIS — E1129 Type 2 diabetes mellitus with other diabetic kidney complication: Secondary | ICD-10-CM | POA: Diagnosis not present

## 2017-05-28 DIAGNOSIS — Z0000 Encounter for general adult medical examination without abnormal findings: Secondary | ICD-10-CM | POA: Diagnosis not present

## 2017-05-28 DIAGNOSIS — E7800 Pure hypercholesterolemia, unspecified: Secondary | ICD-10-CM | POA: Diagnosis not present

## 2017-05-28 DIAGNOSIS — I10 Essential (primary) hypertension: Secondary | ICD-10-CM | POA: Diagnosis not present

## 2017-05-28 DIAGNOSIS — Z23 Encounter for immunization: Secondary | ICD-10-CM | POA: Diagnosis not present

## 2017-06-08 ENCOUNTER — Ambulatory Visit (INDEPENDENT_AMBULATORY_CARE_PROVIDER_SITE_OTHER): Payer: Medicare Other | Admitting: Physician Assistant

## 2017-06-08 VITALS — BP 151/84 | HR 68 | Temp 97.8°F | Ht 63.0 in | Wt 162.0 lb

## 2017-06-08 DIAGNOSIS — E559 Vitamin D deficiency, unspecified: Secondary | ICD-10-CM

## 2017-06-08 DIAGNOSIS — E669 Obesity, unspecified: Secondary | ICD-10-CM | POA: Diagnosis not present

## 2017-06-08 DIAGNOSIS — Z6830 Body mass index (BMI) 30.0-30.9, adult: Secondary | ICD-10-CM | POA: Diagnosis not present

## 2017-06-08 NOTE — Progress Notes (Signed)
Office: [phone]  /  Fax: [phone]   HPI:   Chief Complaint: OBESITY Haley Wilcox is here to discuss her progress with her obesity treatment plan. She is on the  keep a food journal with 500 calories and 35 grams of protein at supper and follow the Category 2 plan and is following her eating plan approximately 95 % of the time. She states she is exercising 0 minutes 0 times per week. Haley Wilcox continues to do well with weight loss. She states she would like more breakfast options. Her weight is 162 lb (73.5 kg) today and has had a weight loss of 1 pound over a period of 2 weeks since her last visit. She has lost 8 lbs since starting treatment with [place].  Vitamin D deficiency Haley Wilcox has a diagnosis of vitamin D deficiency. She is currently taking vit D and denies nausea, vomiting or muscle weakness.  ALLERGIES: Allergies  Allergen Reactions  . Fluoride Preparations Swelling    "lips swell" when brushing teeth with toothpaste that has flouride  . Ivp Dye [Iodinated Diagnostic Agents] Rash    MEDICATIONS: Current Outpatient Medications on File Prior to Visit  Medication Sig Dispense Refill  . amLODipine (NORVASC) 5 MG tablet Take 10 mg by mouth daily.     . carvedilol (COREG) 25 MG tablet Take 25 mg by mouth 2 (two) times daily with a meal.    . diphenhydramine-acetaminophen (TYLENOL PM EXTRA STRENGTH) 25-500 MG TABS tablet Take 1 tablet by mouth at bedtime as needed.    . doxazosin (CARDURA) 2 MG tablet Take 2 mg by mouth daily.    Marland Kitchen losartan-hydrochlorothiazide (HYZAAR) 100-25 MG tablet Take 1 tablet by mouth daily.    . metFORMIN (GLUCOPHAGE) 500 MG tablet TAKE 1 TABLET DAILY WITH BREAKFAST 90 tablet 2  . Multiple Vitamin (MULTIVITAMIN WITH MINERALS) TABS tablet Take 2 tablets by mouth daily.     Marland Kitchen omeprazole (PRILOSEC) 20 MG capsule TAKE 1 CAPSULE DAILY AS NEEDED (HEART BURN) 90 capsule 1  . polyethylene glycol powder (GLYCOLAX/MIRALAX) powder Mix 17gm in 4oz of water.  Start  with 2 doses per day, but may take up to 6 doses per day.  Titrate number of doses to allow 2-3 soft bowel movements daily. 850 g 1  . Vitamin D, Ergocalciferol, (DRISDOL) 50000 units CAPS capsule Take 1 capsule (50,000 Units total) by mouth every 7 (seven) days. 4 capsule 0   No current facility-administered medications on file prior to visit.     PAST MEDICAL HISTORY: Past Medical History:  Diagnosis Date  . Constipation   . Diabetes mellitus   . Gout   . Hyperlipidemia   . Hypertension     PAST SURGICAL HISTORY: Past Surgical History:  Procedure Laterality Date  . ABDOMINAL HYSTERECTOMY    . d &c    . DILATION AND CURETTAGE OF UTERUS    . EYE SURGERY     bilateral cataract  . TUBAL LIGATION      SOCIAL HISTORY: Social History   Tobacco Use  . Smoking status: Never Smoker  . Smokeless tobacco: Never Used  Substance Use Topics  . Alcohol use: No    Alcohol/week: 0.0 oz  . Drug use: No    FAMILY HISTORY: Family History  Problem Relation Age of Onset  . Hypertension Mother   . Heart disease Father     ROS: Review of Systems  Constitutional: Positive for weight loss.  Gastrointestinal: Negative for nausea and vomiting.  Musculoskeletal:  Negative for muscle weakness    PHYSICAL EXAM: Blood pressure (!) 151/84, pulse 68, temperature 97.8 F (36.6 C), temperature source Oral, height 5\' 3"  (1.6 m), weight 162 lb (73.5 kg), SpO2 98 %. Body mass index is 28.7 kg/m. Physical Exam  Constitutional: She is oriented to person, place, and time. She appears well-developed and well-nourished.  Cardiovascular: Normal rate.  Pulmonary/Chest: Effort normal.  Musculoskeletal: Normal range of motion.  Neurological: She is oriented to person, place, and time.  Skin: Skin is warm and dry.  Psychiatric: She has a normal mood and affect. Her behavior is normal.  Vitals reviewed.   RECENT LABS AND TESTS: BMET    Component Value Date/Time   NA 142 03/30/2017 1052     K 3.9 03/30/2017 1052   CL 98 03/30/2017 1052   CO2 27 03/30/2017 1052   GLUCOSE 108 (H) 03/30/2017 1052   GLUCOSE 130 (H) 08/15/2014 2358   BUN 13 03/30/2017 1052   CREATININE 0.61 03/30/2017 1052   CALCIUM 9.7 03/30/2017 1052   GFRNONAA 84 03/30/2017 1052   GFRAA 97 03/30/2017 1052   Lab Results  Component Value Date   HGBA1C 6.9 (H) 03/30/2017   HGBA1C 6.5 04/16/2014   HGBA1C 6.2 10/17/2013   HGBA1C 6.5 04/10/2013   HGBA1C 6.7 (H) 10/07/2012   Lab Results  Component Value Date   INSULIN 15.4 03/30/2017   CBC    Component Value Date/Time   WBC 7.1 03/30/2017 1052   WBC 8.8 08/15/2014 2358   RBC 4.90 03/30/2017 1052   RBC 4.27 08/15/2014 2358   HGB 13.8 03/30/2017 1052   HCT 40.2 03/30/2017 1052   PLT 233 08/15/2014 2358   MCV 82 03/30/2017 1052   MCH 28.2 03/30/2017 1052   MCH 27.9 08/15/2014 2358   MCHC 34.3 03/30/2017 1052   MCHC 32.7 08/15/2014 2358   RDW 15.3 03/30/2017 1052   LYMPHSABS 2.1 03/30/2017 1052   MONOABS 0.7 08/15/2014 2358   EOSABS 0.1 03/30/2017 1052   BASOSABS 0.0 03/30/2017 1052   Iron/TIBC/Ferritin/ %Sat    Component Value Date/Time   IRON 51 10/07/2012 1032   TIBC 192 (L) 06/03/2012 0443   FERRITIN 114 06/03/2012 0443   IRONPCTSAT 8 (L) 06/03/2012 0443   Lipid Panel     Component Value Date/Time   CHOL 245 (H) 03/30/2017 1052   TRIG 151 (H) 03/30/2017 1052   HDL 61 03/30/2017 1052   CHOLHDL 4 04/16/2014 0935   VLDL 29.0 04/16/2014 0935   LDLCALC 154 (H) 03/30/2017 1052   LDLDIRECT 140.8 10/07/2012 1032   Hepatic Function Panel     Component Value Date/Time   PROT 7.3 03/30/2017 1052   ALBUMIN 4.3 03/30/2017 1052   AST 21 03/30/2017 1052   ALT 18 03/30/2017 1052   ALKPHOS 87 03/30/2017 1052   BILITOT 0.3 03/30/2017 1052   BILIDIR 0.1 04/16/2014 0935      Component Value Date/Time   TSH 4.880 (H) 03/30/2017 1052   TSH 3.12 10/07/2012 1032   TSH 1.94 07/23/2011 0944   Results for JAI, BEAR (MRN [id number])  as of 06/08/2017 13:36  Ref. Range 03/30/2017 10:52  Vitamin D, 25-Hydroxy Latest Ref Range: 30.0 - 100.0 ng/mL 32.7   ASSESSMENT AND PLAN: Vitamin D deficiency  Class 1 obesity with serious comorbidity and body mass index (BMI) of 30.0 to 30.9 in adult, unspecified obesity type - BMI greater than 30 with start of program   PLAN:  Vitamin D Deficiency Haley Wilcox was informed  that low vitamin D levels contributes to fatigue and are associated with obesity, breast, and colon cancer. She agrees to continue to take prescription Vit D @50 ,000 IU every week #4 with no refills and will follow up for routine testing of vitamin D, at least 2-3 times per year. She was informed of the risk of over-replacement of vitamin D and agrees to not increase her dose unless she discusses this with [place] first. Haley Wilcox agrees to follow up with our clinic in 2 weeks.  We spent > than 50% of the 15 minute visit on the counseling as documented in the note.  Obesity Haley Wilcox is currently in the action stage of change. As such, her goal is to continue with weight loss efforts She has agreed to follow the Category 2 plan Haley Wilcox has been instructed to work up to a goal of 150 minutes of combined cardio and strengthening exercise per week for weight loss and overall health benefits. We discussed the following Behavioral Modification Strategies today: increasing lean protein intake and work on meal planning and easy cooking plans  Haley Wilcox has agreed to follow up with our clinic in 2 weeks. She was informed of the importance of frequent follow up visits to maximize her success with intensive lifestyle modifications for her multiple health conditions.    OBESITY BEHAVIORAL INTERVENTION VISIT  Today's visit was # 6 out of 22.  Starting weight: 170 lbs Starting date: 03/30/17 Today's weight : 162 lbs Today's date: 06/08/2017 Total lbs lost to date: 8 (Patients must lose 7 lbs in the first 6 months to continue with  counseling)   ASK: We discussed the diagnosis of obesity with Haley Wilcox Haley Wilcox today and Haley Wilcox agreed to give [place] permission to discuss obesity behavioral modification therapy today.  ASSESS: Haley Wilcox has the diagnosis of obesity and her BMI today is 28.7 Haley Wilcox is in the action stage of change   ADVISE: Haley Wilcox was educated on the multiple health risks of obesity as well as the benefit of weight loss to improve her health. She was advised of the need for long term treatment and the importance of lifestyle modifications.  AGREE: Multiple dietary modification options and treatment options were discussed and  Haley Wilcox agreed to the above obesity treatment plan.   Corey Skains, am acting as transcriptionist for [employer], PA-C I, L[facility] Du[facility], have reviewed this note and agree with its content

## 2017-06-22 ENCOUNTER — Ambulatory Visit (INDEPENDENT_AMBULATORY_CARE_PROVIDER_SITE_OTHER): Payer: Medicare Other | Admitting: Physician Assistant

## 2017-06-22 VITALS — BP 153/79 | HR 61 | Temp 97.9°F | Ht 63.0 in | Wt 162.0 lb

## 2017-06-22 DIAGNOSIS — E669 Obesity, unspecified: Secondary | ICD-10-CM | POA: Diagnosis not present

## 2017-06-22 DIAGNOSIS — Z6830 Body mass index (BMI) 30.0-30.9, adult: Secondary | ICD-10-CM | POA: Diagnosis not present

## 2017-06-22 DIAGNOSIS — K219 Gastro-esophageal reflux disease without esophagitis: Secondary | ICD-10-CM

## 2017-06-22 DIAGNOSIS — Z9189 Other specified personal risk factors, not elsewhere classified: Secondary | ICD-10-CM

## 2017-06-22 DIAGNOSIS — E559 Vitamin D deficiency, unspecified: Secondary | ICD-10-CM | POA: Diagnosis not present

## 2017-06-22 MED ORDER — VITAMIN D (ERGOCALCIFEROL) 1.25 MG (50000 UNIT) PO CAPS: 50000.0000 [IU] | ORAL_CAPSULE | ORAL | 0 refills | Status: DC

## 2017-06-22 MED ORDER — CARVEDILOL 6.25 MG PO TABS: 6.2500 mg | ORAL_TABLET | Freq: Two times a day (BID) | ORAL | 0 refills | Status: DC

## 2017-06-22 MED ORDER — OMEPRAZOLE 20 MG PO CPDR: DELAYED_RELEASE_CAPSULE | ORAL | 0 refills | Status: AC

## 2017-06-22 NOTE — Progress Notes (Signed)
Office: [phone]  /  Fax: [phone]   HPI:   Chief Complaint: OBESITY Haley Wilcox is here to discuss her progress with her obesity treatment plan. She is on the Category 2 plan and is following her eating plan approximately 50 % of the time. She states she is exercising 0 minutes 0 times per week. Haley Wilcox has been sick and has not been following the meal plan. She states she feels better and is motivated to get back on track and continue with weight loss. Her weight is 162 lb (73.5 kg) today and has maintained weight over a period of 2 weeks since her last visit. She has lost 8 lbs since starting treatment with [place].  Vitamin D deficiency Haley Wilcox has a diagnosis of vitamin D deficiency. She is currently taking vit D and denies nausea, vomiting or muscle weakness.  At risk for osteopenia and osteoporosis Haley Wilcox is at higher risk of osteopenia and osteoporosis due to vitamin D deficiency.   Gastroesophageal Reflux Disease without esophagitis Haley Wilcox relates history of GERD. She had a recent flare up of heartburn. She has been out of Prilosec and requests refill. She states she will schedule an appointment with her PCP for further management. Haley Wilcox denies any fever, cough,  abdominal pain, nausea, vomiting, diarrhea. Denies any  NSAID or ASA use.  ALLERGIES: Allergies  Allergen Reactions  . Fluoride Preparations Swelling    "lips swell" when brushing teeth with toothpaste that has flouride  . Ivp Dye [Iodinated Diagnostic Agents] Rash    MEDICATIONS: Current Outpatient Medications on File Prior to Visit  Medication Sig Dispense Refill  . amLODipine (NORVASC) 5 MG tablet Take 10 mg by mouth daily.     . diphenhydramine-acetaminophen (TYLENOL PM EXTRA STRENGTH) 25-500 MG TABS tablet Take 1 tablet by mouth at bedtime as needed.    . doxazosin (CARDURA) 2 MG tablet Take 2 mg by mouth daily.    Marland Kitchen losartan-hydrochlorothiazide (HYZAAR) 100-25 MG tablet Take 1 tablet by mouth daily.      . metFORMIN (GLUCOPHAGE) 500 MG tablet TAKE 1 TABLET DAILY WITH BREAKFAST 90 tablet 2  . Multiple Vitamin (MULTIVITAMIN WITH MINERALS) TABS tablet Take 2 tablets by mouth daily.     . polyethylene glycol powder (GLYCOLAX/MIRALAX) powder Mix 17gm in 4oz of water.  Start with 2 doses per day, but may take up to 6 doses per day.  Titrate number of doses to allow 2-3 soft bowel movements daily. 850 g 1   No current facility-administered medications on file prior to visit.     PAST MEDICAL HISTORY: Past Medical History:  Diagnosis Date  . Constipation   . Diabetes mellitus   . Gout   . Hyperlipidemia   . Hypertension     PAST SURGICAL HISTORY: Past Surgical History:  Procedure Laterality Date  . ABDOMINAL HYSTERECTOMY    . d &c    . DILATION AND CURETTAGE OF UTERUS    . EYE SURGERY     bilateral cataract  . TUBAL LIGATION      SOCIAL HISTORY: Social History   Tobacco Use  . Smoking status: Never Smoker  . Smokeless tobacco: Never Used  Substance Use Topics  . Alcohol use: No    Alcohol/week: 0.0 oz  . Drug use: No    FAMILY HISTORY: Family History  Problem Relation Age of Onset  . Hypertension Mother   . Heart disease Father     ROS: Review of Systems  Constitutional: Negative for fever and weight loss.  Respiratory: Negative for cough.   Gastrointestinal: Positive for heartburn. Negative for abdominal pain, diarrhea, nausea and vomiting.  Musculoskeletal:       Negative for muscle weakness    PHYSICAL EXAM: Blood pressure (!) 153/79, pulse 61, temperature 97.9 F (36.6 C), temperature source Oral, height 5\' 3"  (1.6 m), weight 162 lb (73.5 kg), SpO2 99 %. Body mass index is 28.7 kg/m. Physical Exam  Constitutional: She is oriented to person, place, and time. She appears well-developed and well-nourished.  Cardiovascular: Normal rate.  Pulmonary/Chest: Effort normal.  Musculoskeletal: Normal range of motion.  Neurological: She is oriented to person,  place, and time.  Skin: Skin is warm and dry.  Psychiatric: She has a normal mood and affect. Her behavior is normal.  Vitals reviewed.   RECENT LABS AND TESTS: BMET    Component Value Date/Time   NA 142 03/30/2017 1052   K 3.9 03/30/2017 1052   CL 98 03/30/2017 1052   CO2 27 03/30/2017 1052   GLUCOSE 108 (H) 03/30/2017 1052   GLUCOSE 130 (H) 08/15/2014 2358   BUN 13 03/30/2017 1052   CREATININE 0.61 03/30/2017 1052   CALCIUM 9.7 03/30/2017 1052   GFRNONAA 84 03/30/2017 1052   GFRAA 97 03/30/2017 1052   Lab Results  Component Value Date   HGBA1C 6.9 (H) 03/30/2017   HGBA1C 6.5 04/16/2014   HGBA1C 6.2 10/17/2013   HGBA1C 6.5 04/10/2013   HGBA1C 6.7 (H) 10/07/2012   Lab Results  Component Value Date   INSULIN 15.4 03/30/2017   CBC    Component Value Date/Time   WBC 7.1 03/30/2017 1052   WBC 8.8 08/15/2014 2358   RBC 4.90 03/30/2017 1052   RBC 4.27 08/15/2014 2358   HGB 13.8 03/30/2017 1052   HCT 40.2 03/30/2017 1052   PLT 233 08/15/2014 2358   MCV 82 03/30/2017 1052   MCH 28.2 03/30/2017 1052   MCH 27.9 08/15/2014 2358   MCHC 34.3 03/30/2017 1052   MCHC 32.7 08/15/2014 2358   RDW 15.3 03/30/2017 1052   LYMPHSABS 2.1 03/30/2017 1052   MONOABS 0.7 08/15/2014 2358   EOSABS 0.1 03/30/2017 1052   BASOSABS 0.0 03/30/2017 1052   Iron/TIBC/Ferritin/ %Sat    Component Value Date/Time   IRON 51 10/07/2012 1032   TIBC 192 (L) 06/03/2012 0443   FERRITIN 114 06/03/2012 0443   IRONPCTSAT 8 (L) 06/03/2012 0443   Lipid Panel     Component Value Date/Time   CHOL 245 (H) 03/30/2017 1052   TRIG 151 (H) 03/30/2017 1052   HDL 61 03/30/2017 1052   CHOLHDL 4 04/16/2014 0935   VLDL 29.0 04/16/2014 0935   LDLCALC 154 (H) 03/30/2017 1052   LDLDIRECT 140.8 10/07/2012 1032   Hepatic Function Panel     Component Value Date/Time   PROT 7.3 03/30/2017 1052   ALBUMIN 4.3 03/30/2017 1052   AST 21 03/30/2017 1052   ALT 18 03/30/2017 1052   ALKPHOS 87 03/30/2017 1052    BILITOT 0.3 03/30/2017 1052   BILIDIR 0.1 04/16/2014 0935      Component Value Date/Time   TSH 4.880 (H) 03/30/2017 1052   TSH 3.12 10/07/2012 1032   TSH 1.94 07/23/2011 0944   Results for Haley Wilcox (MRN [id number]) as of 06/22/2017 10:12  Ref. Range 03/30/2017 10:52  Vitamin D, 25-Hydroxy Latest Ref Range: 30.0 - 100.0 ng/mL 32.7   ASSESSMENT AND PLAN: Gastroesophageal reflux disease without esophagitis - Plan: omeprazole (PRILOSEC) 20 MG capsule  Vitamin D deficiency - Plan:  Vitamin D, Ergocalciferol, (DRISDOL) 50000 units CAPS capsule  At risk for osteoporosis  Class 1 obesity with serious comorbidity and body mass index (BMI) of 30.0 to 30.9 in adult, unspecified obesity type - Starting BMI greater then 30  PLAN:  Vitamin D Deficiency Haley Wilcox was informed that low vitamin D levels contributes to fatigue and are associated with obesity, breast, and colon cancer. She agrees to continue to take prescription Vit D @50 ,000 IU every week #4 with no refills and will follow up for routine testing of vitamin D, at least 2-3 times per year. She was informed of the risk of over-replacement of vitamin D and agrees to not increase her dose unless she discusses this with [place] first. Haley Wilcox agrees to follow up with our clinic in 2 weeks.  At risk for osteopenia and osteoporosis Haley Wilcox is at risk for osteopenia and osteoporosis due to her vitamin D deficiency. She was encouraged to take her vitamin D and follow her higher calcium diet and increase strengthening exercise to help strengthen her bones and decrease her risk of osteopenia and osteoporosis.  Gastroesophageal Reflux Disease without esophagitis Haley Wilcox agrees to continue Omeprazole 20 mg #30  With no refills and follow up with our clinic in 2 weeks.  Obesity Haley Wilcox is currently in the action stage of change. As such, her goal is to continue with weight loss efforts She has agreed to follow the Category 2 plan Haley Wilcox has been  instructed to work up to a goal of 150 minutes of combined cardio and strengthening exercise per week for weight loss and overall health benefits. We discussed the following Behavioral Modification Strategies today: increasing lean protein intake and work on meal planning and easy cooking plans  Haley Wilcox has agreed to follow up with our clinic in 2 weeks. She was informed of the importance of frequent follow up visits to maximize her success with intensive lifestyle modifications for her multiple health conditions.  OBESITY BEHAVIORAL INTERVENTION VISIT  Today's visit was # 7 out of 22.  Starting weight: 170 lbs Starting date: 03/30/17 Today's weight : 162 lbs Today's date: 06/22/2017 Total lbs lost to date: 8 (Patients must lose 7 lbs in the first 6 months to continue with counseling)   ASK: We discussed the diagnosis of obesity with Haley Wilcox Haley Wilcox today and Haley Wilcox agreed to give [place] permission to discuss obesity behavioral modification therapy today.  ASSESS: Haley Wilcox has the diagnosis of obesity and her BMI today is 28.7 Haley Wilcox is in the action stage of change   ADVISE: Haley Wilcox was educated on the multiple health risks of obesity as well as the benefit of weight loss to improve her health. She was advised of the need for long term treatment and the importance of lifestyle modifications.  AGREE: Multiple dietary modification options and treatment options were discussed and  Haley Wilcox agreed to the above obesity treatment plan.   Corey Skains, am acting as transcriptionist for [employer], PA-C I, Lacy Duverney [facility], have reviewed this note and agree with its content

## 2017-07-06 ENCOUNTER — Ambulatory Visit (INDEPENDENT_AMBULATORY_CARE_PROVIDER_SITE_OTHER): Payer: Medicare Other | Admitting: Physician Assistant

## 2017-07-06 VITALS — BP 135/80 | HR 67 | Temp 98.4°F | Ht 63.0 in | Wt 159.0 lb

## 2017-07-06 DIAGNOSIS — Z6830 Body mass index (BMI) 30.0-30.9, adult: Secondary | ICD-10-CM

## 2017-07-06 DIAGNOSIS — E669 Obesity, unspecified: Secondary | ICD-10-CM | POA: Diagnosis not present

## 2017-07-06 DIAGNOSIS — I10 Essential (primary) hypertension: Secondary | ICD-10-CM | POA: Diagnosis not present

## 2017-07-06 NOTE — Progress Notes (Signed)
Office: [phone]  /  Fax: [phone]   HPI:   Chief Complaint: OBESITY Genessa is here to discuss her progress with her obesity treatment plan. She is on the Category 2 plan and is following her eating plan approximately 90 % of the time. She states she is exercising 0 minutes 0 times per week. Charisma continues to do well with weight loss. She is very pleased with her weight loss and states her hunger is well controlled.  Her weight is 159 lb (72.1 kg) today and has had a weight loss of 3 pounds over a period of 2 weeks since her last visit. She has lost 11 lbs since starting treatment with [place].  Hypertension Evelin ELMINA HENDEL is a 84 y.o. female with hypertension. Jamal's blood pressure is elevated and she denies chest pain or shortness of breath. She is working weight loss to help control her blood pressure with the goal of decreasing her risk of heart attack and stroke. Kebra's blood pressure is not currently controlled.  ALLERGIES: Allergies  Allergen Reactions  . Fluoride Preparations Swelling    "lips swell" when brushing teeth with toothpaste that has flouride  . Ivp Dye [Iodinated Diagnostic Agents] Rash    MEDICATIONS: Current Outpatient Medications on File Prior to Visit  Medication Sig Dispense Refill  . amLODipine (NORVASC) 5 MG tablet Take 10 mg by mouth daily.     . carvedilol (COREG) 6.25 MG tablet Take 1 tablet (6.25 mg total) by mouth 2 (two) times daily with a meal. 30 tablet 0  . diphenhydramine-acetaminophen (TYLENOL PM EXTRA STRENGTH) 25-500 MG TABS tablet Take 1 tablet by mouth at bedtime as needed.    . doxazosin (CARDURA) 2 MG tablet Take 2 mg by mouth daily.    Marland Kitchen losartan-hydrochlorothiazide (HYZAAR) 100-25 MG tablet Take 1 tablet by mouth daily.    . metFORMIN (GLUCOPHAGE) 500 MG tablet TAKE 1 TABLET DAILY WITH BREAKFAST 90 tablet 2  . Multiple Vitamin (MULTIVITAMIN WITH MINERALS) TABS tablet Take 2 tablets by mouth daily.     Marland Kitchen omeprazole  (PRILOSEC) 20 MG capsule TAKE 1 CAPSULE DAILY AS NEEDED (HEART BURN) 30 capsule 0  . polyethylene glycol powder (GLYCOLAX/MIRALAX) powder Mix 17gm in 4oz of water.  Start with 2 doses per day, but may take up to 6 doses per day.  Titrate number of doses to allow 2-3 soft bowel movements daily. 850 g 1  . Vitamin D, Ergocalciferol, (DRISDOL) 50000 units CAPS capsule Take 1 capsule (50,000 Units total) by mouth every 7 (seven) days. 4 capsule 0   No current facility-administered medications on file prior to visit.     PAST MEDICAL HISTORY: Past Medical History:  Diagnosis Date  . Constipation   . Diabetes mellitus   . Gout   . Hyperlipidemia   . Hypertension     PAST SURGICAL HISTORY: Past Surgical History:  Procedure Laterality Date  . ABDOMINAL HYSTERECTOMY    . d &c    . DILATION AND CURETTAGE OF UTERUS    . EYE SURGERY     bilateral cataract  . TUBAL LIGATION      SOCIAL HISTORY: Social History   Tobacco Use  . Smoking status: Never Smoker  . Smokeless tobacco: Never Used  Substance Use Topics  . Alcohol use: No    Alcohol/week: 0.0 oz  . Drug use: No    FAMILY HISTORY: Family History  Problem Relation Age of Onset  . Hypertension Mother   . Heart disease Father  ROS: Review of Systems  Constitutional: Positive for weight loss.  Respiratory: Negative for shortness of breath.   Cardiovascular: Negative for chest pain.    PHYSICAL EXAM: Blood pressure 135/80, pulse 67, temperature 98.4 F (36.9 C), height 5\' 3"  (1.6 m), weight 159 lb (72.1 kg), SpO2 99 %. Body mass index is 28.17 kg/m. Physical Exam  Constitutional: She is oriented to person, place, and time. She appears well-developed and well-nourished.  Cardiovascular: Normal rate.  Pulmonary/Chest: Effort normal.  Musculoskeletal: Normal range of motion.  Neurological: She is oriented to person, place, and time.  Skin: Skin is warm and dry.  Psychiatric: She has a normal mood and affect. Her  behavior is normal.  Vitals reviewed.   RECENT LABS AND TESTS: BMET    Component Value Date/Time   NA 142 03/30/2017 1052   K 3.9 03/30/2017 1052   CL 98 03/30/2017 1052   CO2 27 03/30/2017 1052   GLUCOSE 108 (H) 03/30/2017 1052   GLUCOSE 130 (H) 08/15/2014 2358   BUN 13 03/30/2017 1052   CREATININE 0.61 03/30/2017 1052   CALCIUM 9.7 03/30/2017 1052   GFRNONAA 84 03/30/2017 1052   GFRAA 97 03/30/2017 1052   Lab Results  Component Value Date   HGBA1C 6.9 (H) 03/30/2017   HGBA1C 6.5 04/16/2014   HGBA1C 6.2 10/17/2013   HGBA1C 6.5 04/10/2013   HGBA1C 6.7 (H) 10/07/2012   Lab Results  Component Value Date   INSULIN 15.4 03/30/2017   CBC    Component Value Date/Time   WBC 7.1 03/30/2017 1052   WBC 8.8 08/15/2014 2358   RBC 4.90 03/30/2017 1052   RBC 4.27 08/15/2014 2358   HGB 13.8 03/30/2017 1052   HCT 40.2 03/30/2017 1052   PLT 233 08/15/2014 2358   MCV 82 03/30/2017 1052   MCH 28.2 03/30/2017 1052   MCH 27.9 08/15/2014 2358   MCHC 34.3 03/30/2017 1052   MCHC 32.7 08/15/2014 2358   RDW 15.3 03/30/2017 1052   LYMPHSABS 2.1 03/30/2017 1052   MONOABS 0.7 08/15/2014 2358   EOSABS 0.1 03/30/2017 1052   BASOSABS 0.0 03/30/2017 1052   Iron/TIBC/Ferritin/ %Sat    Component Value Date/Time   IRON 51 10/07/2012 1032   TIBC 192 (L) 06/03/2012 0443   FERRITIN 114 06/03/2012 0443   IRONPCTSAT 8 (L) 06/03/2012 0443   Lipid Panel     Component Value Date/Time   CHOL 245 (H) 03/30/2017 1052   TRIG 151 (H) 03/30/2017 1052   HDL 61 03/30/2017 1052   CHOLHDL 4 04/16/2014 0935   VLDL 29.0 04/16/2014 0935   LDLCALC 154 (H) 03/30/2017 1052   LDLDIRECT 140.8 10/07/2012 1032   Hepatic Function Panel     Component Value Date/Time   PROT 7.3 03/30/2017 1052   ALBUMIN 4.3 03/30/2017 1052   AST 21 03/30/2017 1052   ALT 18 03/30/2017 1052   ALKPHOS 87 03/30/2017 1052   BILITOT 0.3 03/30/2017 1052   BILIDIR 0.1 04/16/2014 0935      Component Value Date/Time   TSH  4.880 (H) 03/30/2017 1052   TSH 3.12 10/07/2012 1032   TSH 1.94 07/23/2011 0944    ASSESSMENT AND PLAN: Essential hypertension  Class 1 obesity with serious comorbidity and body mass index (BMI) of 30.0 to 30.9 in adult, unspecified obesity type - beginning BMI >30  PLAN:  Hypertension We discussed sodium restriction, working on healthy weight loss, and a regular exercise program as the means to achieve improved blood pressure control. Annali agreed with  this plan and agreed to follow up as directed. We will continue to monitor her blood pressure as well as her progress with the above lifestyle modifications. She will continue her medications as prescribed and will watch for signs of hypotension as she continues her lifestyle modifications. Elizabth agrees to follow up with our clinic in 2 to 3 weeks.  We spent > than 50% of the 15 minute visit on the counseling as documented in the note.  Obesity Lorali is currently in the action stage of change. As such, her goal is to continue with weight loss efforts She has agreed to follow the Category 2 plan Damyah has been instructed to work up to a goal of 150 minutes of combined cardio and strengthening exercise per week for weight loss and overall health benefits. We discussed the following Behavioral Modification Strategies today: increasing lean protein intake and better snacking choices   Terrie has agreed to follow up with our clinic in 2 to 3 weeks. She was informed of the importance of frequent follow up visits to maximize her success with intensive lifestyle modifications for her multiple health conditions.   OBESITY BEHAVIORAL INTERVENTION VISIT  Today's visit was # 8 out of 22.  Starting weight: 170 lbs Starting date: 03/30/17 Today's weight : 159 lbs Today's date: 07/06/2017 Total lbs lost to date: 11 (Patients must lose 7 lbs in the first 6 months to continue with counseling)   ASK: We discussed the diagnosis of obesity  with Metztli Marisue [place] today and Novella agreed to give [place] permission to discuss obesity behavioral modification therapy today.  ASSESS: Faduma has the diagnosis of obesity and her BMI today is 28.17 Hafsah is in the action stage of change   ADVISE: Jessyka was educated on the multiple health risks of obesity as well as the benefit of weight loss to improve her health. She was advised of the need for long term treatment and the importance of lifestyle modifications.  AGREE: Multiple dietary modification options and treatment options were discussed and  Shritha agreed to the above obesity treatment plan.   Wilhemena Durie, am acting as transcriptionist for Lacy Duverney, PA-C I, Lacy Duverney [facility], have reviewed this note and agree with its content

## 2017-07-26 ENCOUNTER — Ambulatory Visit (INDEPENDENT_AMBULATORY_CARE_PROVIDER_SITE_OTHER): Payer: Medicare Other | Admitting: Physician Assistant

## 2017-07-26 VITALS — BP 135/77 | HR 69 | Temp 98.3°F | Ht 63.0 in | Wt 161.0 lb

## 2017-07-26 DIAGNOSIS — E669 Obesity, unspecified: Secondary | ICD-10-CM | POA: Diagnosis not present

## 2017-07-26 DIAGNOSIS — I10 Essential (primary) hypertension: Secondary | ICD-10-CM | POA: Diagnosis not present

## 2017-07-26 DIAGNOSIS — Z6830 Body mass index (BMI) 30.0-30.9, adult: Secondary | ICD-10-CM | POA: Diagnosis not present

## 2017-07-26 DIAGNOSIS — E559 Vitamin D deficiency, unspecified: Secondary | ICD-10-CM

## 2017-07-26 MED ORDER — VITAMIN D (ERGOCALCIFEROL) 1.25 MG (50000 UNIT) PO CAPS: 50000.0000 [IU] | ORAL_CAPSULE | ORAL | 0 refills | Status: DC

## 2017-07-27 NOTE — Progress Notes (Signed)
Office: [phone]  /  Fax: [phone]   HPI:   Chief Complaint: OBESITY Haley Wilcox here to discuss her progress with her obesity treatment plan. She Wilcox on the Category 2 plan and Wilcox following her eating plan approximately 90 % of the time. She states she Wilcox exercising 0 minutes 0 times per week. Haley Wilcox was on vacation at [employer] and found it harder to make better food choices.  Her weight Wilcox 161 lb (73 kg) today and Wilcox gained 2 pounds since her last visit. She Wilcox lost 9 lbs since starting treatment with [place].  Vitamin D Deficiency Haley Wilcox Wilcox a diagnosis of vitamin D deficiency. She Wilcox currently taking prescription Vit D and denies nausea, vomiting or muscle weakness.  Hypertension Haley Wilcox Wilcox a 84 y.o. female with hypertension. Haley Wilcox blood pressure Wilcox stable and she denies chest pain or shortness of breath. She Wilcox working weight loss to help control her blood pressure with the goal of decreasing her risk of heart attack and stroke. Haley Wilcox blood pressure Wilcox currently controlled.  ALLERGIES: Allergies  Allergen Reactions  . Fluoride Preparations Swelling    "lips swell" when brushing teeth with toothpaste that Wilcox flouride  . Ivp Dye [Iodinated Diagnostic Agents] Rash    MEDICATIONS: Current Outpatient Medications on File Prior to Visit  Medication Sig Dispense Refill  . amLODipine (NORVASC) 5 MG tablet Take 10 mg by mouth daily.     . carvedilol (COREG) 6.25 MG tablet Take 1 tablet (6.25 mg total) by mouth 2 (two) times daily with a meal. 30 tablet 0  . diphenhydramine-acetaminophen (TYLENOL PM EXTRA STRENGTH) 25-500 MG TABS tablet Take 1 tablet by mouth at bedtime as needed.    . doxazosin (CARDURA) 2 MG tablet Take 2 mg by mouth daily.    Marland Kitchen losartan-hydrochlorothiazide (HYZAAR) 100-25 MG tablet Take 1 tablet by mouth daily.    . metFORMIN (GLUCOPHAGE) 500 MG tablet TAKE 1 TABLET DAILY WITH BREAKFAST 90 tablet 2  . Multiple Vitamin (MULTIVITAMIN WITH  MINERALS) TABS tablet Take 2 tablets by mouth daily.     Marland Kitchen omeprazole (PRILOSEC) 20 MG capsule TAKE 1 CAPSULE DAILY AS NEEDED (HEART BURN) 30 capsule 0  . polyethylene glycol powder (GLYCOLAX/MIRALAX) powder Mix 17gm in 4oz of water.  Start with 2 doses per day, but may take up to 6 doses per day.  Titrate number of doses to allow 2-3 soft bowel movements daily. 850 g 1   No current facility-administered medications on file prior to visit.     PAST MEDICAL HISTORY: Past Medical History:  Diagnosis Date  . Constipation   . Diabetes mellitus   . Gout   . Hyperlipidemia   . Hypertension     PAST SURGICAL HISTORY: Past Surgical History:  Procedure Laterality Date  . ABDOMINAL HYSTERECTOMY    . d &c    . DILATION AND CURETTAGE OF UTERUS    . EYE SURGERY     bilateral cataract  . TUBAL LIGATION      SOCIAL HISTORY: Social History   Tobacco Use  . Smoking status: Never Smoker  . Smokeless tobacco: Never Used  Substance Use Topics  . Alcohol use: No    Alcohol/week: 0.0 oz  . Drug use: No    FAMILY HISTORY: Family History  Problem Relation Age of Onset  . Hypertension Mother   . Heart disease Father     ROS: Review of Systems  Constitutional: Negative for weight loss.  Respiratory: Negative  for shortness of breath.   Cardiovascular: Negative for chest pain.  Gastrointestinal: Negative for nausea and vomiting.  Musculoskeletal:       Negative muscle weakness    PHYSICAL EXAM: Blood pressure 135/77, pulse 69, temperature 98.3 F (36.8 C), temperature source Oral, height 5\' 3"  (1.6 m), weight 161 lb (73 kg), SpO2 97 %. Body mass index Wilcox 28.52 kg/m. Physical Exam  Constitutional: She Wilcox oriented to person, place, and time. She appears well-developed and well-nourished.  Cardiovascular: Normal rate.  Pulmonary/Chest: Effort normal.  Musculoskeletal: Normal range of motion.  Neurological: She Wilcox oriented to person, place, and time.  Skin: Skin Wilcox warm and dry.    Psychiatric: She Wilcox a normal mood and affect. Her behavior Wilcox normal.  Vitals reviewed.   RECENT LABS AND TESTS: BMET    Component Value Date/Time   NA 142 03/30/2017 1052   K 3.9 03/30/2017 1052   CL 98 03/30/2017 1052   CO2 27 03/30/2017 1052   GLUCOSE 108 (H) 03/30/2017 1052   GLUCOSE 130 (H) 08/15/2014 2358   BUN 13 03/30/2017 1052   CREATININE 0.61 03/30/2017 1052   CALCIUM 9.7 03/30/2017 1052   GFRNONAA 84 03/30/2017 1052   GFRAA 97 03/30/2017 1052   Lab Results  Component Value Date   HGBA1C 6.9 (H) 03/30/2017   HGBA1C 6.5 04/16/2014   HGBA1C 6.2 10/17/2013   HGBA1C 6.5 04/10/2013   HGBA1C 6.7 (H) 10/07/2012   Lab Results  Component Value Date   INSULIN 15.4 03/30/2017   CBC    Component Value Date/Time   WBC 7.1 03/30/2017 1052   WBC 8.8 08/15/2014 2358   RBC 4.90 03/30/2017 1052   RBC 4.27 08/15/2014 2358   HGB 13.8 03/30/2017 1052   HCT 40.2 03/30/2017 1052   PLT 233 08/15/2014 2358   MCV 82 03/30/2017 1052   MCH 28.2 03/30/2017 1052   MCH 27.9 08/15/2014 2358   MCHC 34.3 03/30/2017 1052   MCHC 32.7 08/15/2014 2358   RDW 15.3 03/30/2017 1052   LYMPHSABS 2.1 03/30/2017 1052   MONOABS 0.7 08/15/2014 2358   EOSABS 0.1 03/30/2017 1052   BASOSABS 0.0 03/30/2017 1052   Iron/TIBC/Ferritin/ %Sat    Component Value Date/Time   IRON 51 10/07/2012 1032   TIBC 192 (L) 06/03/2012 0443   FERRITIN 114 06/03/2012 0443   IRONPCTSAT 8 (L) 06/03/2012 0443   Lipid Panel     Component Value Date/Time   CHOL 245 (H) 03/30/2017 1052   TRIG 151 (H) 03/30/2017 1052   HDL 61 03/30/2017 1052   CHOLHDL 4 04/16/2014 0935   VLDL 29.0 04/16/2014 0935   LDLCALC 154 (H) 03/30/2017 1052   LDLDIRECT 140.8 10/07/2012 1032   Hepatic Function Panel     Component Value Date/Time   PROT 7.3 03/30/2017 1052   ALBUMIN 4.3 03/30/2017 1052   AST 21 03/30/2017 1052   ALT 18 03/30/2017 1052   ALKPHOS 87 03/30/2017 1052   BILITOT 0.3 03/30/2017 1052   BILIDIR 0.1  04/16/2014 0935      Component Value Date/Time   TSH 4.880 (H) 03/30/2017 1052   TSH 3.12 10/07/2012 1032   TSH 1.94 07/23/2011 0944  Results for Haley, Wilcox (MRN [id number]) as of 07/27/2017 08:46  Ref. Range 03/30/2017 10:52  Vitamin D, 25-Hydroxy Latest Ref Range: 30.0 - 100.0 ng/mL 32.7    ASSESSMENT AND PLAN: Vitamin D deficiency - Plan: Vitamin D, Ergocalciferol, (DRISDOL) 50000 units CAPS capsule  Essential hypertension  Class 1  obesity with serious comorbidity and body mass index (BMI) of 30.0 to 30.9 in adult, unspecified obesity type - Starting BMI greater then 30  PLAN:  Vitamin D Deficiency Haley Wilcox was informed that low vitamin D levels contributes to fatigue and are associated with obesity, breast, and colon cancer. Haley Wilcox agrees to continue taking prescription Vit D @50 ,000 IU every week #4 and we will refill for 1 month. She will follow up for routine testing of vitamin D, at least 2-3 times per year. She was informed of the risk of over-replacement of vitamin D and agrees to not increase her dose unless she discusses this with [place] first. Haley Wilcox agrees to follow up with our clinic in 2 weeks.  Hypertension We discussed sodium restriction, working on healthy weight loss, and a regular exercise program as the means to achieve improved blood pressure control. Haley Wilcox agreed with this plan and agreed to follow up as directed. We will continue to monitor her blood pressure as well as her progress with the above lifestyle modifications. She will continue her medications as prescribed and will watch for signs of hypotension as she continues her lifestyle modifications. Haley Wilcox agrees to follow up with our clinic in 2 weeks.  Obesity Haley Wilcox Wilcox currently in the action stage of change. As such, her goal Wilcox to continue with weight loss efforts She Wilcox agreed to follow the Category 2 plan Haley Wilcox been instructed to work up to a goal of 150 minutes of combined cardio and  strengthening exercise per week for weight loss and overall health benefits. We discussed the following Behavioral Modification Strategies today: celebration eating strategies and planning for success   Haley Wilcox agreed to follow up with our clinic in 2 weeks. She was informed of the importance of frequent follow up visits to maximize her success with intensive lifestyle modifications for her multiple health conditions.   OBESITY BEHAVIORAL INTERVENTION VISIT  Today's visit was # 9 out of 22.  Starting weight: 170 lbs Starting date: 03/30/17 Today's weight : 161 lbs  Today's date: 07/26/2017 Total lbs lost to date: 9 (Patients must lose 7 lbs in the first 6 months to continue with counseling)   ASK: We discussed the diagnosis of obesity with Haley Wilcox today and Haley Wilcox agreed to give [place] permission to discuss obesity behavioral modification therapy today.  ASSESS: Haley Wilcox Wilcox the diagnosis of obesity and her BMI today Wilcox 28.53 Haley Wilcox Wilcox in the action stage of change   ADVISE: Haley Wilcox was educated on the multiple health risks of obesity as well as the benefit of weight loss to improve her health. She was advised of the need for long term treatment and the importance of lifestyle modifications.  AGREE: Multiple dietary modification options and treatment options were discussed and  Haley Wilcox agreed to the above obesity treatment plan.   Haley Wilcox, am acting as transcriptionist for Lacy Duverney, PA-C I, Lacy Duverney [facility], have reviewed this note and agree with its content

## 2017-08-10 ENCOUNTER — Ambulatory Visit (INDEPENDENT_AMBULATORY_CARE_PROVIDER_SITE_OTHER): Payer: Medicare Other | Admitting: Physician Assistant

## 2017-08-10 VITALS — BP 143/85 | HR 66 | Temp 98.4°F | Ht 63.0 in | Wt 160.0 lb

## 2017-08-10 DIAGNOSIS — Z6830 Body mass index (BMI) 30.0-30.9, adult: Secondary | ICD-10-CM | POA: Diagnosis not present

## 2017-08-10 DIAGNOSIS — I10 Essential (primary) hypertension: Secondary | ICD-10-CM | POA: Diagnosis not present

## 2017-08-10 DIAGNOSIS — E669 Obesity, unspecified: Secondary | ICD-10-CM

## 2017-08-10 NOTE — Progress Notes (Signed)
Office: [phone]  /  Fax: [phone]   HPI:   Chief Complaint: OBESITY Haley Wilcox is here to discuss her progress with her obesity treatment plan. She is on the Category 2 plan and is following her eating plan approximately 90 % of the time. She states she is walking for 15 to 20 minutes 6 times per week. Haley Wilcox continues to do well with weight loss. She is pleased as she is at her target weight goal. Haley Wilcox would like more meal planning ideas. Her weight is 160 lb (72.6 kg) today and has had a weight loss of 1 pound over a period of 2 weeks since her last visit. She has lost 10 lbs since starting treatment with [place].  Hypertension Haley Wilcox is a 84 y.o. female with hypertension. Her blood pressure is elevated today at 143/85. Her blood pressure has been elevated in the past. She declines any adjustment to her medications today. Haley Wilcox denies chest pain or shortness of breath on exertion. She is working weight loss to help control her blood pressure with the goal of decreasing her risk of heart attack and stroke. Haley Wilcox blood pressure is not currently controlled.  ALLERGIES: Allergies  Allergen Reactions  . Fluoride Preparations Swelling    "lips swell" when brushing teeth with toothpaste that has flouride  . Ivp Dye [Iodinated Diagnostic Agents] Rash    MEDICATIONS: Current Outpatient Medications on File Prior to Visit  Medication Sig Dispense Refill  . amLODipine (NORVASC) 5 MG tablet Take 10 mg by mouth daily.     . carvedilol (COREG) 6.25 MG tablet Take 1 tablet (6.25 mg total) by mouth 2 (two) times daily with a meal. 30 tablet 0  . diphenhydramine-acetaminophen (TYLENOL PM EXTRA STRENGTH) 25-500 MG TABS tablet Take 1 tablet by mouth at bedtime as needed.    . doxazosin (CARDURA) 2 MG tablet Take 2 mg by mouth daily.    Marland Kitchen losartan-hydrochlorothiazide (HYZAAR) 100-25 MG tablet Take 1 tablet by mouth daily.    . metFORMIN (GLUCOPHAGE) 500 MG tablet TAKE 1  TABLET DAILY WITH BREAKFAST 90 tablet 2  . Multiple Vitamin (MULTIVITAMIN WITH MINERALS) TABS tablet Take 2 tablets by mouth daily.     Marland Kitchen omeprazole (PRILOSEC) 20 MG capsule TAKE 1 CAPSULE DAILY AS NEEDED (HEART BURN) 30 capsule 0  . polyethylene glycol powder (GLYCOLAX/MIRALAX) powder Mix 17gm in 4oz of water.  Start with 2 doses per day, but may take up to 6 doses per day.  Titrate number of doses to allow 2-3 soft bowel movements daily. 850 g 1  . Vitamin D, Ergocalciferol, (DRISDOL) 50000 units CAPS capsule Take 1 capsule (50,000 Units total) by mouth every 7 (seven) days. 4 capsule 0   No current facility-administered medications on file prior to visit.     PAST MEDICAL HISTORY: Past Medical History:  Diagnosis Date  . Constipation   . Diabetes mellitus   . Gout   . Hyperlipidemia   . Hypertension     PAST SURGICAL HISTORY: Past Surgical History:  Procedure Laterality Date  . ABDOMINAL HYSTERECTOMY    . d &c    . DILATION AND CURETTAGE OF UTERUS    . EYE SURGERY     bilateral cataract  . TUBAL LIGATION      SOCIAL HISTORY: Social History   Tobacco Use  . Smoking status: Never Smoker  . Smokeless tobacco: Never Used  Substance Use Topics  . Alcohol use: No    Alcohol/week: 0.0 oz  .  Drug use: No    FAMILY HISTORY: Family History  Problem Relation Age of Onset  . Hypertension Mother   . Heart disease Father     ROS: Review of Systems  Constitutional: Positive for weight loss.  Respiratory: Negative for shortness of breath (on exertion).   Cardiovascular: Negative for chest pain.    PHYSICAL EXAM: Blood pressure (!) 143/85, pulse 66, temperature 98.4 F (36.9 C), height 5\' 3"  (1.6 m), weight 160 lb (72.6 kg), SpO2 98 %. Body mass index is 28.34 kg/m. Physical Exam  Constitutional: She is oriented to person, place, and time. She appears well-developed and well-nourished.  Cardiovascular: Normal rate.  Pulmonary/Chest: Effort normal.  Musculoskeletal:  Normal range of motion.  Neurological: She is oriented to person, place, and time.  Skin: Skin is warm and dry.  Psychiatric: She has a normal mood and affect. Her behavior is normal.  Vitals reviewed.   RECENT LABS AND TESTS: BMET    Component Value Date/Time   NA 142 03/30/2017 1052   K 3.9 03/30/2017 1052   CL 98 03/30/2017 1052   CO2 27 03/30/2017 1052   GLUCOSE 108 (H) 03/30/2017 1052   GLUCOSE 130 (H) 08/15/2014 2358   BUN 13 03/30/2017 1052   CREATININE 0.61 03/30/2017 1052   CALCIUM 9.7 03/30/2017 1052   GFRNONAA 84 03/30/2017 1052   GFRAA 97 03/30/2017 1052   Lab Results  Component Value Date   HGBA1C 6.9 (H) 03/30/2017   HGBA1C 6.5 04/16/2014   HGBA1C 6.2 10/17/2013   HGBA1C 6.5 04/10/2013   HGBA1C 6.7 (H) 10/07/2012   Lab Results  Component Value Date   INSULIN 15.4 03/30/2017   CBC    Component Value Date/Time   WBC 7.1 03/30/2017 1052   WBC 8.8 08/15/2014 2358   RBC 4.90 03/30/2017 1052   RBC 4.27 08/15/2014 2358   HGB 13.8 03/30/2017 1052   HCT 40.2 03/30/2017 1052   PLT 233 08/15/2014 2358   MCV 82 03/30/2017 1052   MCH 28.2 03/30/2017 1052   MCH 27.9 08/15/2014 2358   MCHC 34.3 03/30/2017 1052   MCHC 32.7 08/15/2014 2358   RDW 15.3 03/30/2017 1052   LYMPHSABS 2.1 03/30/2017 1052   MONOABS 0.7 08/15/2014 2358   EOSABS 0.1 03/30/2017 1052   BASOSABS 0.0 03/30/2017 1052   Iron/TIBC/Ferritin/ %Sat    Component Value Date/Time   IRON 51 10/07/2012 1032   TIBC 192 (L) 06/03/2012 0443   FERRITIN 114 06/03/2012 0443   IRONPCTSAT 8 (L) 06/03/2012 0443   Lipid Panel     Component Value Date/Time   CHOL 245 (H) 03/30/2017 1052   TRIG 151 (H) 03/30/2017 1052   HDL 61 03/30/2017 1052   CHOLHDL 4 04/16/2014 0935   VLDL 29.0 04/16/2014 0935   LDLCALC 154 (H) 03/30/2017 1052   LDLDIRECT 140.8 10/07/2012 1032   Hepatic Function Panel     Component Value Date/Time   PROT 7.3 03/30/2017 1052   ALBUMIN 4.3 03/30/2017 1052   AST 21 03/30/2017  1052   ALT 18 03/30/2017 1052   ALKPHOS 87 03/30/2017 1052   BILITOT 0.3 03/30/2017 1052   BILIDIR 0.1 04/16/2014 0935      Component Value Date/Time   TSH 4.880 (H) 03/30/2017 1052   TSH 3.12 10/07/2012 1032   TSH 1.94 07/23/2011 0944   Results for MARCHEL, FOOTE (MRN [id number]) as of 08/10/2017 10:57  Ref. Range 03/30/2017 10:52  Vitamin D, 25-Hydroxy Latest Ref Range: 30.0 - 100.0 ng/mL 32.7  ASSESSMENT AND PLAN: Essential hypertension  Class 1 obesity with serious comorbidity and body mass index (BMI) of 30.0 to 30.9 in adult, unspecified obesity type - Starting BMI greater then 30  PLAN:  Hypertension We discussed sodium restriction, working on healthy weight loss, and a regular exercise program as the means to achieve improved blood pressure control. Haley Wilcox agreed with this plan and agreed to follow up as directed. We will continue to monitor her blood pressure as well as her progress with the above lifestyle modifications. She will continue her medications as prescribed and will watch for signs of hypotension as she continues her lifestyle modifications.  We spent > than 50% of the 15 minute visit on the counseling as documented in the note.  Obesity Haley Wilcox is currently in the action stage of change. As such, her goal is to continue with weight loss efforts She has agreed to follow the Pescatarian eating plan Haley Wilcox has been instructed to work up to a goal of 150 minutes of combined cardio and strengthening exercise per week for weight loss and overall health benefits. We discussed the following Behavioral Modification Strategies today: increasing lean protein intake and work on meal planning and easy cooking plans  Haley Wilcox has agreed to follow up with our clinic in 3 weeks. She was informed of the importance of frequent follow up visits to maximize her success with intensive lifestyle modifications for her multiple health conditions.   OBESITY BEHAVIORAL  INTERVENTION VISIT  Today's visit was # 10 out of 22.  Starting weight: 170 lbs Starting date: 03/30/17 Today's weight : 160 lbs  Today's date: 08/10/2017 Total lbs lost to date: 10 (Patients must lose 7 lbs in the first 6 months to continue with counseling)   ASK: We discussed the diagnosis of obesity with Haley Wilcox today and Haley Wilcox agreed to give [place] permission to discuss obesity behavioral modification therapy today.  ASSESS: Haley Wilcox has the diagnosis of obesity and her BMI today is 28.35 Haley Wilcox is in the action stage of change   ADVISE: Haley Wilcox was educated on the multiple health risks of obesity as well as the benefit of weight loss to improve her health. She was advised of the need for long term treatment and the importance of lifestyle modifications.  AGREE: Multiple dietary modification options and treatment options were discussed and  Haley Wilcox agreed to the above obesity treatment plan.   Corey Skains, am acting as transcriptionist for [employer], PA-C I, Lacy Duverney [facility], have reviewed this note and agree with its content

## 2017-08-31 ENCOUNTER — Ambulatory Visit (INDEPENDENT_AMBULATORY_CARE_PROVIDER_SITE_OTHER): Payer: Medicare Other | Admitting: Physician Assistant

## 2017-08-31 VITALS — BP 154/80 | HR 70 | Temp 98.4°F | Ht 63.0 in | Wt 159.0 lb

## 2017-08-31 DIAGNOSIS — E66811 Obesity, class 1: Secondary | ICD-10-CM

## 2017-08-31 DIAGNOSIS — E559 Vitamin D deficiency, unspecified: Secondary | ICD-10-CM | POA: Diagnosis not present

## 2017-08-31 DIAGNOSIS — Z6830 Body mass index (BMI) 30.0-30.9, adult: Secondary | ICD-10-CM

## 2017-08-31 DIAGNOSIS — I10 Essential (primary) hypertension: Secondary | ICD-10-CM | POA: Diagnosis not present

## 2017-08-31 DIAGNOSIS — E669 Obesity, unspecified: Secondary | ICD-10-CM | POA: Diagnosis not present

## 2017-08-31 MED ORDER — VITAMIN D (ERGOCALCIFEROL) 1.25 MG (50000 UNIT) PO CAPS: 50000.0000 [IU] | ORAL_CAPSULE | ORAL | 0 refills | Status: DC

## 2017-08-31 NOTE — Progress Notes (Signed)
Office: [phone]  /  Fax: [phone]   HPI:   Chief Complaint: OBESITY Haley Wilcox is here to discuss her progress with her obesity treatment plan. She is on the Pescatarian eating plan and is following her eating plan approximately 85 % of the time. She states she is walking for 20 minutes 7 times per week. Haley Wilcox continues to do well with weight loss. She is working on maintaining her weight loss.  Her weight is 159 lb (72.1 kg) today and has had a weight loss of 1 pound over a period of 3 weeks since her last visit. She has lost 11 lbs since starting treatment with [place].  Vitamin D deficiency Haley Wilcox has a diagnosis of vitamin D deficiency. She is currently taking prescription Vit D and denies nausea, vomiting or muscle weakness.  Hypertension Haley Wilcox is a 84 y.o. female with hypertension. Haley Wilcox's blood pressure is elevated. She declines any medication adjustment. She denies chest pain or shortness of breath. She is working weight loss to help control her blood pressure with the goal of decreasing her risk of heart attack and stroke. Haley Wilcox's blood pressure is not currently controlled.  ALLERGIES: Allergies  Allergen Reactions  . Fluoride Preparations Swelling    "lips swell" when brushing teeth with toothpaste that has flouride  . Ivp Dye [Iodinated Diagnostic Agents] Rash    MEDICATIONS: Current Outpatient Medications on File Prior to Visit  Medication Sig Dispense Refill  . amLODipine (NORVASC) 5 MG tablet Take 10 mg by mouth daily.     . carvedilol (COREG) 6.25 MG tablet Take 1 tablet (6.25 mg total) by mouth 2 (two) times daily with a meal. 30 tablet 0  . diphenhydramine-acetaminophen (TYLENOL PM EXTRA STRENGTH) 25-500 MG TABS tablet Take 1 tablet by mouth at bedtime as needed.    . doxazosin (CARDURA) 2 MG tablet Take 2 mg by mouth daily.    Marland Kitchen losartan-hydrochlorothiazide (HYZAAR) 100-25 MG tablet Take 1 tablet by mouth daily.    . metFORMIN (GLUCOPHAGE) 500  MG tablet TAKE 1 TABLET DAILY WITH BREAKFAST 90 tablet 2  . Multiple Vitamin (MULTIVITAMIN WITH MINERALS) TABS tablet Take 2 tablets by mouth daily.     Marland Kitchen omeprazole (PRILOSEC) 20 MG capsule TAKE 1 CAPSULE DAILY AS NEEDED (HEART BURN) 30 capsule 0  . polyethylene glycol powder (GLYCOLAX/MIRALAX) powder Mix 17gm in 4oz of water.  Start with 2 doses per day, but may take up to 6 doses per day.  Titrate number of doses to allow 2-3 soft bowel movements daily. 850 g 1  . Vitamin D, Ergocalciferol, (DRISDOL) 50000 units CAPS capsule Take 1 capsule (50,000 Units total) by mouth every 7 (seven) days. 4 capsule 0   No current facility-administered medications on file prior to visit.     PAST MEDICAL HISTORY: Past Medical History:  Diagnosis Date  . Constipation   . Diabetes mellitus   . Gout   . Hyperlipidemia   . Hypertension     PAST SURGICAL HISTORY: Past Surgical History:  Procedure Laterality Date  . ABDOMINAL HYSTERECTOMY    . d &c    . DILATION AND CURETTAGE OF UTERUS    . EYE SURGERY     bilateral cataract  . TUBAL LIGATION      SOCIAL HISTORY: Social History   Tobacco Use  . Smoking status: Never Smoker  . Smokeless tobacco: Never Used  Substance Use Topics  . Alcohol use: No    Alcohol/week: 0.0 oz  . Drug use:  No    FAMILY HISTORY: Family History  Problem Relation Age of Onset  . Hypertension Mother   . Heart disease Father     ROS: Review of Systems  Constitutional: Positive for weight loss.  Respiratory: Negative for shortness of breath.   Cardiovascular: Negative for chest pain.  Gastrointestinal: Negative for nausea and vomiting.  Musculoskeletal:       Negative muscle weakness    PHYSICAL EXAM: Blood pressure (!) 154/80, pulse 70, temperature 98.4 F (36.9 C), temperature source Oral, height 5\' 3"  (1.6 m), weight 159 lb (72.1 kg), SpO2 99 %. Body mass index is 28.17 kg/m. Physical Exam  Constitutional: She is oriented to person, place, and  time. She appears well-developed and well-nourished.  Cardiovascular: Normal rate.  Pulmonary/Chest: Effort normal.  Musculoskeletal: Normal range of motion.  Neurological: She is oriented to person, place, and time.  Skin: Skin is warm and dry.  Psychiatric: She has a normal mood and affect. Her behavior is normal.  Vitals reviewed.   RECENT LABS AND TESTS: BMET    Component Value Date/Time   NA 142 03/30/2017 1052   K 3.9 03/30/2017 1052   CL 98 03/30/2017 1052   CO2 27 03/30/2017 1052   GLUCOSE 108 (H) 03/30/2017 1052   GLUCOSE 130 (H) 08/15/2014 2358   BUN 13 03/30/2017 1052   CREATININE 0.61 03/30/2017 1052   CALCIUM 9.7 03/30/2017 1052   GFRNONAA 84 03/30/2017 1052   GFRAA 97 03/30/2017 1052   Lab Results  Component Value Date   HGBA1C 6.9 (H) 03/30/2017   HGBA1C 6.5 04/16/2014   HGBA1C 6.2 10/17/2013   HGBA1C 6.5 04/10/2013   HGBA1C 6.7 (H) 10/07/2012   Lab Results  Component Value Date   INSULIN 15.4 03/30/2017   CBC    Component Value Date/Time   WBC 7.1 03/30/2017 1052   WBC 8.8 08/15/2014 2358   RBC 4.90 03/30/2017 1052   RBC 4.27 08/15/2014 2358   HGB 13.8 03/30/2017 1052   HCT 40.2 03/30/2017 1052   PLT 233 08/15/2014 2358   MCV 82 03/30/2017 1052   MCH 28.2 03/30/2017 1052   MCH 27.9 08/15/2014 2358   MCHC 34.3 03/30/2017 1052   MCHC 32.7 08/15/2014 2358   RDW 15.3 03/30/2017 1052   LYMPHSABS 2.1 03/30/2017 1052   MONOABS 0.7 08/15/2014 2358   EOSABS 0.1 03/30/2017 1052   BASOSABS 0.0 03/30/2017 1052   Iron/TIBC/Ferritin/ %Sat    Component Value Date/Time   IRON 51 10/07/2012 1032   TIBC 192 (L) 06/03/2012 0443   FERRITIN 114 06/03/2012 0443   IRONPCTSAT 8 (L) 06/03/2012 0443   Lipid Panel     Component Value Date/Time   CHOL 245 (H) 03/30/2017 1052   TRIG 151 (H) 03/30/2017 1052   HDL 61 03/30/2017 1052   CHOLHDL 4 04/16/2014 0935   VLDL 29.0 04/16/2014 0935   LDLCALC 154 (H) 03/30/2017 1052   LDLDIRECT 140.8 10/07/2012 1032     Hepatic Function Panel     Component Value Date/Time   PROT 7.3 03/30/2017 1052   ALBUMIN 4.3 03/30/2017 1052   AST 21 03/30/2017 1052   ALT 18 03/30/2017 1052   ALKPHOS 87 03/30/2017 1052   BILITOT 0.3 03/30/2017 1052   BILIDIR 0.1 04/16/2014 0935      Component Value Date/Time   TSH 4.880 (H) 03/30/2017 1052   TSH 3.12 10/07/2012 1032   TSH 1.94 07/23/2011 0944  Results for Haley Wilcox, Haley Wilcox (MRN [id number]) as of 08/31/2017 12:45  Ref. Range 03/30/2017 10:52  Vitamin D, 25-Hydroxy Latest Ref Range: 30.0 - 100.0 ng/mL 32.7    ASSESSMENT AND PLAN: Vitamin D deficiency - Plan: Vitamin D, Ergocalciferol, (DRISDOL) 50000 units CAPS capsule  Essential hypertension  Class 1 obesity with serious comorbidity and body mass index (BMI) of 30.0 to 30.9 in adult, unspecified obesity type - starting BMI greater then 30  PLAN:  Vitamin D Deficiency Haley Wilcox was informed that low vitamin D levels contributes to fatigue and are associated with obesity, breast, and colon cancer. Haley Wilcox agrees to continue taking prescription Vit D @50 ,000 IU every week #4 and we will refill for 1 month. She will follow up for routine testing of vitamin D, at least 2-3 times per year. She was informed of the risk of over-replacement of vitamin D and agrees to not increase her dose unless she discusses this with [place] first. Haley Wilcox agrees to follow up with our clinic in 3 weeks.  Hypertension We discussed sodium restriction, working on healthy weight loss, and a regular exercise program as the means to achieve improved blood pressure control. Haley Wilcox agreed with this plan and agreed to follow up as directed. We will continue to monitor her blood pressure as well as her progress with the above lifestyle modifications. She will continue her medications and will watch for signs of hypotension as she continues her lifestyle modifications. Haley Wilcox agrees to follow up with our clinic in 3 weeks  Obesity Haley Wilcox is  currently in the action stage of change. As such, her goal is to maintain weight for now She has agreed to portion control better and make smarter food choices, such as increase vegetables and decrease simple carbohydrates  Haley Wilcox has been instructed to work up to a goal of 150 minutes of combined cardio and strengthening exercise per week for weight loss and overall health benefits. We discussed the following Behavioral Modification Strategies today: increasing lean protein intake and work on meal planning and easy cooking plans   Adaleigh has agreed to follow up with our clinic in 3 weeks. She was informed of the importance of frequent follow up visits to maximize her success with intensive lifestyle modifications for her multiple health conditions.   OBESITY BEHAVIORAL INTERVENTION VISIT  Today's visit was # 11 out of 22.  Starting weight: 170 lbs Starting date: 03/30/17 Today's weight : 159 lbs  Today's date: 08/31/2017 Total lbs lost to date: 11 (Patients must lose 7 lbs in the first 6 months to continue with counseling)   ASK: We discussed the diagnosis of obesity with Haley Wilcox today and Sloan agreed to give [place] permission to discuss obesity behavioral modification therapy today.  ASSESS: Haley Wilcox has the diagnosis of obesity and her BMI today is 28.17 Haley Wilcox is in the action stage of change   ADVISE: Haley Wilcox was educated on the multiple health risks of obesity as well as the benefit of weight loss to improve her health. She was advised of the need for long term treatment and the importance of lifestyle modifications.  AGREE: Multiple dietary modification options and treatment options were discussed and  Haley Wilcox agreed to the above obesity treatment plan.   Haley Wilcox, am acting as transcriptionist for Lacy Duverney, PA-C I, Lacy Duverney [facility], have reviewed this note and agree with its content

## 2017-09-03 ENCOUNTER — Other Ambulatory Visit (INDEPENDENT_AMBULATORY_CARE_PROVIDER_SITE_OTHER): Payer: Self-pay | Admitting: Physician Assistant

## 2017-09-03 DIAGNOSIS — E559 Vitamin D deficiency, unspecified: Secondary | ICD-10-CM

## 2017-09-21 ENCOUNTER — Ambulatory Visit (INDEPENDENT_AMBULATORY_CARE_PROVIDER_SITE_OTHER): Payer: Medicare Other | Admitting: Physician Assistant

## 2017-09-21 VITALS — BP 151/74 | HR 70 | Temp 98.3°F | Ht 63.0 in | Wt 163.0 lb

## 2017-09-21 DIAGNOSIS — I10 Essential (primary) hypertension: Secondary | ICD-10-CM

## 2017-09-21 DIAGNOSIS — E559 Vitamin D deficiency, unspecified: Secondary | ICD-10-CM

## 2017-09-21 DIAGNOSIS — Z6830 Body mass index (BMI) 30.0-30.9, adult: Secondary | ICD-10-CM | POA: Diagnosis not present

## 2017-09-21 DIAGNOSIS — Z9189 Other specified personal risk factors, not elsewhere classified: Secondary | ICD-10-CM | POA: Diagnosis not present

## 2017-09-21 DIAGNOSIS — E669 Obesity, unspecified: Secondary | ICD-10-CM | POA: Diagnosis not present

## 2017-09-21 MED ORDER — VITAMIN D (ERGOCALCIFEROL) 1.25 MG (50000 UNIT) PO CAPS: 50000.0000 [IU] | ORAL_CAPSULE | ORAL | 0 refills | Status: DC

## 2017-09-21 NOTE — Progress Notes (Signed)
Office: [phone]  /  Fax: [phone]   HPI:   Chief Complaint: OBESITY Haley Wilcox is here to discuss her progress with her obesity treatment plan. She is on the portion control better and make smarter food choices and is following her eating plan approximately 85 % of the time. She states she is walking the dog for 20 minutes 7 times per week. Haley Wilcox has not been as mindful of her eating and has not been following the meal plan. She is motivated to get back on track and continue with weight loss. Her weight is 163 lb (73.9 kg) today and has had a weight gain of 4 pounds over a period of 3 weeks since her last visit. She has lost 7 lbs since starting treatment with [place].  Vitamin D deficiency Haley Wilcox has a diagnosis of vitamin D deficiency. She is currently taking vit D and denies nausea, vomiting or muscle weakness.  Hypertension Haley Wilcox is a 84 y.o. female with hypertension. Her blood pressure is elevated today at 151/74. Haley Wilcox denies chest pain or shortness of breath on exertion. She declines any adjustment to her blood pressure medications. She is working weight loss to help control her blood pressure with the goal of decreasing her risk of heart attack and stroke. [name] blood pressure is not currently controlled.  At risk for cardiovascular disease Haley Wilcox is at a higher than average risk for cardiovascular disease due to obesity and hypertension. She currently denies any chest pain.  ALLERGIES: Allergies  Allergen Reactions  . Fluoride Preparations Swelling    "lips swell" when brushing teeth with toothpaste that has flouride  . Ivp Dye [Iodinated Diagnostic Agents] Rash    MEDICATIONS: Current Outpatient Medications on File Prior to Visit  Medication Sig Dispense Refill  . amLODipine (NORVASC) 5 MG tablet Take 10 mg by mouth daily.     . carvedilol (COREG) 6.25 MG tablet Take 1 tablet (6.25 mg total) by mouth 2 (two) times daily with a meal. 30  tablet 0  . diphenhydramine-acetaminophen (TYLENOL PM EXTRA STRENGTH) 25-500 MG TABS tablet Take 1 tablet by mouth at bedtime as needed.    . doxazosin (CARDURA) 2 MG tablet Take 2 mg by mouth daily.    Marland Kitchen losartan-hydrochlorothiazide (HYZAAR) 100-25 MG tablet Take 1 tablet by mouth daily.    . metFORMIN (GLUCOPHAGE) 500 MG tablet TAKE 1 TABLET DAILY WITH BREAKFAST 90 tablet 2  . Multiple Vitamin (MULTIVITAMIN WITH MINERALS) TABS tablet Take 2 tablets by mouth daily.     Marland Kitchen omeprazole (PRILOSEC) 20 MG capsule TAKE 1 CAPSULE DAILY AS NEEDED (HEART BURN) 30 capsule 0  . polyethylene glycol powder (GLYCOLAX/MIRALAX) powder Mix 17gm in 4oz of water.  Start with 2 doses per day, but may take up to 6 doses per day.  Titrate number of doses to allow 2-3 soft bowel movements daily. 850 g 1   No current facility-administered medications on file prior to visit.     PAST MEDICAL HISTORY: Past Medical History:  Diagnosis Date  . Constipation   . Diabetes mellitus   . Gout   . Hyperlipidemia   . Hypertension     PAST SURGICAL HISTORY: Past Surgical History:  Procedure Laterality Date  . ABDOMINAL HYSTERECTOMY    . d &c    . DILATION AND CURETTAGE OF UTERUS    . EYE SURGERY     bilateral cataract  . TUBAL LIGATION      SOCIAL HISTORY: Social History  Tobacco Use  . Smoking status: Never Smoker  . Smokeless tobacco: Never Used  Substance Use Topics  . Alcohol use: No    Alcohol/week: 0.0 oz  . Drug use: No    FAMILY HISTORY: Family History  Problem Relation Age of Onset  . Hypertension Mother   . Heart disease Father     ROS: Review of Systems  Constitutional: Negative for weight loss.  Respiratory: Negative for shortness of breath (on exertion).   Cardiovascular: Negative for chest pain.  Gastrointestinal: Negative for nausea and vomiting.  Musculoskeletal:       Negative for muscle weakness    PHYSICAL EXAM: Blood pressure (!) 151/74, pulse 70, temperature 98.3 F  (36.8 C), temperature source Oral, height 5\' 3"  (1.6 m), weight 163 lb (73.9 kg), SpO2 98 %. Body mass index is 28.87 kg/m. Physical Exam  Constitutional: She is oriented to person, place, and time. She appears well-developed and well-nourished.  Cardiovascular: Normal rate.  Pulmonary/Chest: Effort normal.  Musculoskeletal: Normal range of motion.  Neurological: She is oriented to person, place, and time.  Skin: Skin is warm and dry.  Psychiatric: She has a normal mood and affect. Her behavior is normal.  Vitals reviewed.   RECENT LABS AND TESTS: BMET    Component Value Date/Time   NA 142 03/30/2017 1052   K 3.9 03/30/2017 1052   CL 98 03/30/2017 1052   CO2 27 03/30/2017 1052   GLUCOSE 108 (H) 03/30/2017 1052   GLUCOSE 130 (H) 08/15/2014 2358   BUN 13 03/30/2017 1052   CREATININE 0.61 03/30/2017 1052   CALCIUM 9.7 03/30/2017 1052   GFRNONAA 84 03/30/2017 1052   GFRAA 97 03/30/2017 1052   Lab Results  Component Value Date   HGBA1C 6.9 (H) 03/30/2017   HGBA1C 6.5 04/16/2014   HGBA1C 6.2 10/17/2013   HGBA1C 6.5 04/10/2013   HGBA1C 6.7 (H) 10/07/2012   Lab Results  Component Value Date   INSULIN 15.4 03/30/2017   CBC    Component Value Date/Time   WBC 7.1 03/30/2017 1052   WBC 8.8 08/15/2014 2358   RBC 4.90 03/30/2017 1052   RBC 4.27 08/15/2014 2358   HGB 13.8 03/30/2017 1052   HCT 40.2 03/30/2017 1052   PLT 233 08/15/2014 2358   MCV 82 03/30/2017 1052   MCH 28.2 03/30/2017 1052   MCH 27.9 08/15/2014 2358   MCHC 34.3 03/30/2017 1052   MCHC 32.7 08/15/2014 2358   RDW 15.3 03/30/2017 1052   LYMPHSABS 2.1 03/30/2017 1052   MONOABS 0.7 08/15/2014 2358   EOSABS 0.1 03/30/2017 1052   BASOSABS 0.0 03/30/2017 1052   Iron/TIBC/Ferritin/ %Sat    Component Value Date/Time   IRON 51 10/07/2012 1032   TIBC 192 (L) 06/03/2012 0443   FERRITIN 114 06/03/2012 0443   IRONPCTSAT 8 (L) 06/03/2012 0443   Lipid Panel     Component Value Date/Time   CHOL 245 (H)  03/30/2017 1052   TRIG 151 (H) 03/30/2017 1052   HDL 61 03/30/2017 1052   CHOLHDL 4 04/16/2014 0935   VLDL 29.0 04/16/2014 0935   LDLCALC 154 (H) 03/30/2017 1052   LDLDIRECT 140.8 10/07/2012 1032   Hepatic Function Panel     Component Value Date/Time   PROT 7.3 03/30/2017 1052   ALBUMIN 4.3 03/30/2017 1052   AST 21 03/30/2017 1052   ALT 18 03/30/2017 1052   ALKPHOS 87 03/30/2017 1052   BILITOT 0.3 03/30/2017 1052   BILIDIR 0.1 04/16/2014 0935  Component Value Date/Time   TSH 4.880 (H) 03/30/2017 1052   TSH 3.12 10/07/2012 1032   TSH 1.94 07/23/2011 0944   Results for Haley Wilcox, Haley Wilcox (MRN [id number]) as of 09/21/2017 14:39  Ref. Range 03/30/2017 10:52  Vitamin D, 25-Hydroxy Latest Ref Range: 30.0 - 100.0 ng/mL 32.7   ASSESSMENT AND PLAN: Vitamin D deficiency - Plan: Vitamin D, Ergocalciferol, (DRISDOL) 50000 units CAPS capsule  Essential hypertension  At risk for heart disease  Class 1 obesity with serious comorbidity and body mass index (BMI) of 30.0 to 30.9 in adult, unspecified obesity type - beginning BMI >30  PLAN:  Vitamin D Deficiency Haley Wilcox was informed that low vitamin D levels contributes to fatigue and are associated with obesity, breast, and colon cancer. She agrees to continue to take prescription Vit D @50 ,000 IU every week #4 with no refills and will follow up for routine testing of vitamin D, at least 2-3 times per year. She was informed of the risk of over-replacement of vitamin D and agrees to not increase her dose unless she discusses this with [place] first.  Hypertension We discussed sodium restriction, working on healthy weight loss, and a regular exercise program as the means to achieve improved blood pressure control. Haley Wilcox agreed with this plan and agreed to follow up as directed. We will continue to monitor her blood pressure as well as her progress with the above lifestyle modifications. She will continue her medications as prescribed and will  watch for signs of hypotension as she continues her lifestyle modifications.  Cardiovascular risk counseling Haley Wilcox was given extended (15 minutes) coronary artery disease prevention counseling today. She is 84 y.o. female and has risk factors for heart disease including obesity and hypertension. We discussed intensive lifestyle modifications today with an emphasis on specific weight loss instructions and strategies. Pt was also informed of the importance of increasing exercise and decreasing saturated fats to help prevent heart disease.  Obesity Haley Wilcox is currently in the action stage of change. As such, her goal is to get back to weightloss efforts  She has agreed to follow the Category 2 plan Haley Wilcox has been instructed to work up to a goal of 150 minutes of combined cardio and strengthening exercise per week for weight loss and overall health benefits. We discussed the following Behavioral Modification Strategies today: increasing lean protein intake and work on meal planning and easy cooking plans  Haley Wilcox has agreed to follow up with our clinic in 3 weeks. She was informed of the importance of frequent follow up visits to maximize her success with intensive lifestyle modifications for her multiple health conditions.   OBESITY BEHAVIORAL INTERVENTION VISIT  Today's visit was # 12 out of 22.  Starting weight: 170 lbs Starting date: 03/30/17 Today's weight : 163 lbs  Today's date: 09/21/2017 Total lbs lost to date: 7 (Patients must lose 7 lbs in the first 6 months to continue with counseling)   ASK: We discussed the diagnosis of obesity with Haley Wilcox Haley Wilcox today and Haley Wilcox agreed to give [place] permission to discuss obesity behavioral modification therapy today.  ASSESS: Zakaria has the diagnosis of obesity and her BMI today is 28.88 Haley Wilcox is in the action stage of change   ADVISE: Willer was educated on the multiple health risks of obesity as well as the benefit of weight loss  to improve her health. She was advised of the need for long term treatment and the importance of lifestyle modifications.  AGREE: Multiple dietary modification options and  treatment options were discussed and  Tyah agreed to the above obesity treatment plan.   IDoreene Nest, am acting as Location manager for [employer], PA-C

## 2017-10-11 ENCOUNTER — Encounter (INDEPENDENT_AMBULATORY_CARE_PROVIDER_SITE_OTHER): Payer: Self-pay | Admitting: Physician Assistant

## 2017-10-11 ENCOUNTER — Ambulatory Visit (INDEPENDENT_AMBULATORY_CARE_PROVIDER_SITE_OTHER): Payer: Medicare Other | Admitting: Physician Assistant

## 2017-10-11 VITALS — BP 160/82 | HR 67 | Temp 98.0°F | Ht 62.0 in | Wt 162.0 lb

## 2017-10-11 DIAGNOSIS — Z6830 Body mass index (BMI) 30.0-30.9, adult: Secondary | ICD-10-CM | POA: Diagnosis not present

## 2017-10-11 DIAGNOSIS — E669 Obesity, unspecified: Secondary | ICD-10-CM

## 2017-10-11 DIAGNOSIS — I10 Essential (primary) hypertension: Secondary | ICD-10-CM

## 2017-10-11 NOTE — Progress Notes (Signed)
Office: [phone]  /  Fax: [phone]   HPI:   Chief Complaint: OBESITY Haley Wilcox is here to discuss her progress with her obesity treatment plan. She is on the Category 2 plan and is following her eating plan approximately 80 % of the time. She states she is walking for 20-25 minutes 7 times per week. Haley Wilcox requests that she stops following up with our clinic. She is made aware of the 6 month follow up policy and voices understanding.  Her weight is 162 lb (73.5 kg) today and has had a weight loss of 1 pound over a period of 2 to 3 weeks since her last visit. She has lost 8 lbs since starting treatment with [place].  Hypertension Haley Wilcox is a 84 y.o. female with hypertension. Haley Wilcox's blood pressure is elevated. She continues to declines any adjustments to her blood pressure medications. She denies chest pain or shortness of breath. She is working weight loss to help control her blood pressure with the goal of decreasing her risk of heart attack and stroke. Haley Wilcox's blood pressure is not currently controlled.  ALLERGIES: Allergies  Allergen Reactions  . Fluoride Preparations Swelling    "lips swell" when brushing teeth with toothpaste that has flouride  . Ivp Dye [Iodinated Diagnostic Agents] Rash    MEDICATIONS: Current Outpatient Medications on File Prior to Visit  Medication Sig Dispense Refill  . amLODipine (NORVASC) 5 MG tablet Take 10 mg by mouth daily.     . carvedilol (COREG) 6.25 MG tablet Take 1 tablet (6.25 mg total) by mouth 2 (two) times daily with a meal. 30 tablet 0  . diphenhydramine-acetaminophen (TYLENOL PM EXTRA STRENGTH) 25-500 MG TABS tablet Take 1 tablet by mouth at bedtime as needed.    . doxazosin (CARDURA) 2 MG tablet Take 2 mg by mouth daily.    Marland Kitchen losartan-hydrochlorothiazide (HYZAAR) 100-25 MG tablet Take 1 tablet by mouth daily.    . metFORMIN (GLUCOPHAGE) 500 MG tablet TAKE 1 TABLET DAILY WITH BREAKFAST 90 tablet 2  . Multiple Vitamin  (MULTIVITAMIN WITH MINERALS) TABS tablet Take 2 tablets by mouth daily.     Marland Kitchen omeprazole (PRILOSEC) 20 MG capsule TAKE 1 CAPSULE DAILY AS NEEDED (HEART BURN) 30 capsule 0  . polyethylene glycol powder (GLYCOLAX/MIRALAX) powder Mix 17gm in 4oz of water.  Start with 2 doses per day, but may take up to 6 doses per day.  Titrate number of doses to allow 2-3 soft bowel movements daily. 850 g 1  . Vitamin D, Ergocalciferol, (DRISDOL) 50000 units CAPS capsule Take 1 capsule (50,000 Units total) by mouth every 7 (seven) days. 4 capsule 0   No current facility-administered medications on file prior to visit.     PAST MEDICAL HISTORY: Past Medical History:  Diagnosis Date  . Constipation   . Diabetes mellitus   . Gout   . Hyperlipidemia   . Hypertension     PAST SURGICAL HISTORY: Past Surgical History:  Procedure Laterality Date  . ABDOMINAL HYSTERECTOMY    . d &c    . DILATION AND CURETTAGE OF UTERUS    . EYE SURGERY     bilateral cataract  . TUBAL LIGATION      SOCIAL HISTORY: Social History   Tobacco Use  . Smoking status: Never Smoker  . Smokeless tobacco: Never Used  Substance Use Topics  . Alcohol use: No    Alcohol/week: 0.0 oz  . Drug use: No    FAMILY HISTORY: Family History  Problem  Relation Age of Onset  . Hypertension Mother   . Heart disease Father     ROS: Review of Systems  Constitutional: Positive for weight loss.  Respiratory: Negative for shortness of breath.   Cardiovascular: Negative for chest pain.    PHYSICAL EXAM: Blood pressure (!) 160/82, pulse 67, temperature 98 F (36.7 C), temperature source Oral, height 5\' 2"  (1.575 m), weight 162 lb (73.5 kg), SpO2 98 %. Body mass index is 29.63 kg/m. Physical Exam  Constitutional: She is oriented to person, place, and time. She appears well-developed and well-nourished.  Cardiovascular: Normal rate.  Pulmonary/Chest: Effort normal.  Musculoskeletal: Normal range of motion.  Neurological: She is  oriented to person, place, and time.  Skin: Skin is warm and dry.  Psychiatric: She has a normal mood and affect. Her behavior is normal.  Vitals reviewed.   RECENT LABS AND TESTS: BMET    Component Value Date/Time   NA 142 03/30/2017 1052   K 3.9 03/30/2017 1052   CL 98 03/30/2017 1052   CO2 27 03/30/2017 1052   GLUCOSE 108 (H) 03/30/2017 1052   GLUCOSE 130 (H) 08/15/2014 2358   BUN 13 03/30/2017 1052   CREATININE 0.61 03/30/2017 1052   CALCIUM 9.7 03/30/2017 1052   GFRNONAA 84 03/30/2017 1052   GFRAA 97 03/30/2017 1052   Lab Results  Component Value Date   HGBA1C 6.9 (H) 03/30/2017   HGBA1C 6.5 04/16/2014   HGBA1C 6.2 10/17/2013   HGBA1C 6.5 04/10/2013   HGBA1C 6.7 (H) 10/07/2012   Lab Results  Component Value Date   INSULIN 15.4 03/30/2017   CBC    Component Value Date/Time   WBC 7.1 03/30/2017 1052   WBC 8.8 08/15/2014 2358   RBC 4.90 03/30/2017 1052   RBC 4.27 08/15/2014 2358   HGB 13.8 03/30/2017 1052   HCT 40.2 03/30/2017 1052   PLT 233 08/15/2014 2358   MCV 82 03/30/2017 1052   MCH 28.2 03/30/2017 1052   MCH 27.9 08/15/2014 2358   MCHC 34.3 03/30/2017 1052   MCHC 32.7 08/15/2014 2358   RDW 15.3 03/30/2017 1052   LYMPHSABS 2.1 03/30/2017 1052   MONOABS 0.7 08/15/2014 2358   EOSABS 0.1 03/30/2017 1052   BASOSABS 0.0 03/30/2017 1052   Iron/TIBC/Ferritin/ %Sat    Component Value Date/Time   IRON 51 10/07/2012 1032   TIBC 192 (L) 06/03/2012 0443   FERRITIN 114 06/03/2012 0443   IRONPCTSAT 8 (L) 06/03/2012 0443   Lipid Panel     Component Value Date/Time   CHOL 245 (H) 03/30/2017 1052   TRIG 151 (H) 03/30/2017 1052   HDL 61 03/30/2017 1052   CHOLHDL 4 04/16/2014 0935   VLDL 29.0 04/16/2014 0935   LDLCALC 154 (H) 03/30/2017 1052   LDLDIRECT 140.8 10/07/2012 1032   Hepatic Function Panel     Component Value Date/Time   PROT 7.3 03/30/2017 1052   ALBUMIN 4.3 03/30/2017 1052   AST 21 03/30/2017 1052   ALT 18 03/30/2017 1052   ALKPHOS 87  03/30/2017 1052   BILITOT 0.3 03/30/2017 1052   BILIDIR 0.1 04/16/2014 0935      Component Value Date/Time   TSH 4.880 (H) 03/30/2017 1052   TSH 3.12 10/07/2012 1032   TSH 1.94 07/23/2011 0944    ASSESSMENT AND PLAN: Essential hypertension  Class 1 obesity with serious comorbidity and body mass index (BMI) of 30.0 to 30.9 in adult, unspecified obesity type - Starting BMI greater then 30  PLAN:  Hypertension We discussed  sodium restriction, working on healthy weight loss, and a regular exercise program as the means to achieve improved blood pressure control. Haley Wilcox agreed with this plan and agreed to follow up as directed. We will continue to monitor her blood pressure as well as her progress with the above lifestyle modifications. She will continue her medications and follow up with her primary care physician for further management of hypertension. She will watch for signs of hypotension as she continues her lifestyle modifications.  We spent > than 50% of the 15 minute visit on the counseling as documented in the note.  Obesity Haley Wilcox is currently in the action stage of change. As such, her goal is to maintain weight for now She has agreed to portion control better and make smarter food choices, such as increase vegetables and decrease simple carbohydrates  Haley Wilcox has been instructed to work up to a goal of 150 minutes of combined cardio and strengthening exercise per week for weight loss and overall health benefits. We discussed the following Behavioral Modification Strategies today: planning for success   Haley Wilcox has declined to follow up with our clinic. She was informed of the importance of frequent follow up visits to maximize her success with intensive lifestyle modifications for her multiple health conditions.   OBESITY BEHAVIORAL INTERVENTION VISIT  Today's visit was # 13 out of 22.  Starting weight: 170 lbs Starting date: 03/30/17 Today's weight : 162 lbs  Today's  date: 10/11/2017 Total lbs lost to date: 8 (Patients must lose 7 lbs in the first 6 months to continue with counseling)   ASK: We discussed the diagnosis of obesity with Haley Wilcox today and Haley Wilcox agreed to give [place] permission to discuss obesity behavioral modification therapy today.  ASSESS: Haley Wilcox has the diagnosis of obesity and her BMI today is 29.62 Haley Wilcox is in the action stage of change   ADVISE: Haley Wilcox was educated on the multiple health risks of obesity as well as the benefit of weight loss to improve her health. She was advised of the need for long term treatment and the importance of lifestyle modifications.  AGREE: Multiple dietary modification options and treatment options were discussed and  Haley Wilcox agreed to the above obesity treatment plan.   Haley Wilcox, am acting as transcriptionist for Lacy Duverney, PA-C I, Lacy Duverney [facility], have reviewed this note and agree with its content

## 2017-12-16 DIAGNOSIS — E7800 Pure hypercholesterolemia, unspecified: Secondary | ICD-10-CM | POA: Diagnosis not present

## 2017-12-16 DIAGNOSIS — I10 Essential (primary) hypertension: Secondary | ICD-10-CM | POA: Diagnosis not present

## 2017-12-16 DIAGNOSIS — R809 Proteinuria, unspecified: Secondary | ICD-10-CM | POA: Diagnosis not present

## 2017-12-16 DIAGNOSIS — E663 Overweight: Secondary | ICD-10-CM | POA: Diagnosis not present

## 2017-12-16 DIAGNOSIS — Z23 Encounter for immunization: Secondary | ICD-10-CM | POA: Diagnosis not present

## 2017-12-16 DIAGNOSIS — E1129 Type 2 diabetes mellitus with other diabetic kidney complication: Secondary | ICD-10-CM | POA: Diagnosis not present

## 2017-12-30 DIAGNOSIS — H40013 Open angle with borderline findings, low risk, bilateral: Secondary | ICD-10-CM | POA: Diagnosis not present

## 2017-12-30 DIAGNOSIS — H40053 Ocular hypertension, bilateral: Secondary | ICD-10-CM | POA: Diagnosis not present

## 2018-03-02 DIAGNOSIS — R0981 Nasal congestion: Secondary | ICD-10-CM | POA: Diagnosis not present

## 2018-03-02 DIAGNOSIS — R5383 Other fatigue: Secondary | ICD-10-CM | POA: Diagnosis not present

## 2018-03-02 DIAGNOSIS — R51 Headache: Secondary | ICD-10-CM | POA: Diagnosis not present

## 2018-03-02 DIAGNOSIS — E039 Hypothyroidism, unspecified: Secondary | ICD-10-CM | POA: Diagnosis not present

## 2018-03-09 DIAGNOSIS — E039 Hypothyroidism, unspecified: Secondary | ICD-10-CM | POA: Diagnosis not present

## 2018-04-04 DIAGNOSIS — H40023 Open angle with borderline findings, high risk, bilateral: Secondary | ICD-10-CM | POA: Diagnosis not present

## 2018-04-21 DIAGNOSIS — E039 Hypothyroidism, unspecified: Secondary | ICD-10-CM | POA: Diagnosis not present

## 2018-05-31 DIAGNOSIS — E1169 Type 2 diabetes mellitus with other specified complication: Secondary | ICD-10-CM | POA: Diagnosis not present

## 2018-05-31 DIAGNOSIS — E039 Hypothyroidism, unspecified: Secondary | ICD-10-CM | POA: Diagnosis not present

## 2018-05-31 DIAGNOSIS — I10 Essential (primary) hypertension: Secondary | ICD-10-CM | POA: Diagnosis not present

## 2018-05-31 DIAGNOSIS — Z1389 Encounter for screening for other disorder: Secondary | ICD-10-CM | POA: Diagnosis not present

## 2018-05-31 DIAGNOSIS — Z0000 Encounter for general adult medical examination without abnormal findings: Secondary | ICD-10-CM | POA: Diagnosis not present

## 2018-05-31 DIAGNOSIS — E7800 Pure hypercholesterolemia, unspecified: Secondary | ICD-10-CM | POA: Diagnosis not present

## 2018-06-07 ENCOUNTER — Emergency Department (HOSPITAL_COMMUNITY): Payer: Medicare Other

## 2018-06-07 ENCOUNTER — Inpatient Hospital Stay (HOSPITAL_COMMUNITY)
Admission: EM | Admit: 2018-06-07 | Discharge: 2018-06-09 | DRG: 065 | Disposition: A | Discharge status: Home Health | Payer: Medicare Other | Attending: Internal Medicine | Admitting: Internal Medicine

## 2018-06-07 ENCOUNTER — Observation Stay (HOSPITAL_COMMUNITY): Payer: Medicare Other

## 2018-06-07 ENCOUNTER — Encounter (HOSPITAL_COMMUNITY): Payer: Self-pay | Admitting: Emergency Medicine

## 2018-06-07 DIAGNOSIS — E785 Hyperlipidemia, unspecified: Secondary | ICD-10-CM | POA: Diagnosis present

## 2018-06-07 DIAGNOSIS — Z7984 Long term (current) use of oral hypoglycemic drugs: Secondary | ICD-10-CM | POA: Diagnosis not present

## 2018-06-07 DIAGNOSIS — I10 Essential (primary) hypertension: Secondary | ICD-10-CM | POA: Diagnosis present

## 2018-06-07 DIAGNOSIS — H547 Unspecified visual loss: Secondary | ICD-10-CM | POA: Diagnosis not present

## 2018-06-07 DIAGNOSIS — I63422 Cerebral infarction due to embolism of left anterior cerebral artery: Secondary | ICD-10-CM | POA: Diagnosis not present

## 2018-06-07 DIAGNOSIS — I482 Chronic atrial fibrillation, unspecified: Secondary | ICD-10-CM | POA: Diagnosis not present

## 2018-06-07 DIAGNOSIS — Z79890 Hormone replacement therapy (postmenopausal): Secondary | ICD-10-CM | POA: Diagnosis not present

## 2018-06-07 DIAGNOSIS — Z8249 Family history of ischemic heart disease and other diseases of the circulatory system: Secondary | ICD-10-CM

## 2018-06-07 DIAGNOSIS — R4701 Aphasia: Secondary | ICD-10-CM

## 2018-06-07 DIAGNOSIS — E876 Hypokalemia: Secondary | ICD-10-CM | POA: Diagnosis present

## 2018-06-07 DIAGNOSIS — E1151 Type 2 diabetes mellitus with diabetic peripheral angiopathy without gangrene: Secondary | ICD-10-CM | POA: Diagnosis present

## 2018-06-07 DIAGNOSIS — E039 Hypothyroidism, unspecified: Secondary | ICD-10-CM | POA: Diagnosis not present

## 2018-06-07 DIAGNOSIS — D509 Iron deficiency anemia, unspecified: Secondary | ICD-10-CM | POA: Diagnosis present

## 2018-06-07 DIAGNOSIS — R42 Dizziness and giddiness: Secondary | ICD-10-CM | POA: Diagnosis not present

## 2018-06-07 DIAGNOSIS — I639 Cerebral infarction, unspecified: Secondary | ICD-10-CM

## 2018-06-07 DIAGNOSIS — G459 Transient cerebral ischemic attack, unspecified: Secondary | ICD-10-CM | POA: Diagnosis present

## 2018-06-07 DIAGNOSIS — I4819 Other persistent atrial fibrillation: Secondary | ICD-10-CM | POA: Diagnosis present

## 2018-06-07 DIAGNOSIS — I63522 Cerebral infarction due to unspecified occlusion or stenosis of left anterior cerebral artery: Secondary | ICD-10-CM | POA: Diagnosis not present

## 2018-06-07 DIAGNOSIS — E782 Mixed hyperlipidemia: Secondary | ICD-10-CM | POA: Diagnosis present

## 2018-06-07 DIAGNOSIS — R29818 Other symptoms and signs involving the nervous system: Secondary | ICD-10-CM | POA: Diagnosis not present

## 2018-06-07 DIAGNOSIS — I6523 Occlusion and stenosis of bilateral carotid arteries: Secondary | ICD-10-CM | POA: Diagnosis present

## 2018-06-07 DIAGNOSIS — I4891 Unspecified atrial fibrillation: Secondary | ICD-10-CM | POA: Diagnosis not present

## 2018-06-07 DIAGNOSIS — R29702 NIHSS score 2: Secondary | ICD-10-CM | POA: Diagnosis not present

## 2018-06-07 DIAGNOSIS — I63233 Cerebral infarction due to unspecified occlusion or stenosis of bilateral carotid arteries: Secondary | ICD-10-CM | POA: Diagnosis not present

## 2018-06-07 DIAGNOSIS — E119 Type 2 diabetes mellitus without complications: Secondary | ICD-10-CM

## 2018-06-07 DIAGNOSIS — Z90710 Acquired absence of both cervix and uterus: Secondary | ICD-10-CM

## 2018-06-07 DIAGNOSIS — Z888 Allergy status to other drugs, medicaments and biological substances status: Secondary | ICD-10-CM | POA: Diagnosis not present

## 2018-06-07 DIAGNOSIS — Z79899 Other long term (current) drug therapy: Secondary | ICD-10-CM | POA: Diagnosis not present

## 2018-06-07 HISTORY — DX: Cerebral infarction, unspecified: I63.9

## 2018-06-07 LAB — URINALYSIS, ROUTINE W REFLEX MICROSCOPIC
Bacteria, UA: NONE SEEN
Bilirubin Urine: NEGATIVE
Glucose, UA: NEGATIVE mg/dL
Hgb urine dipstick: NEGATIVE
Ketones, ur: NEGATIVE mg/dL
Leukocytes,Ua: NEGATIVE
Nitrite: NEGATIVE
Protein, ur: >=300 mg/dL — AB
Specific Gravity, Urine: 1.005 (ref 1.005–1.030)
pH: 7 (ref 5.0–8.0)

## 2018-06-07 LAB — I-STAT CREATININE, ED: Creatinine, Ser: 0.8 mg/dL (ref 0.44–1.00)

## 2018-06-07 LAB — COMPREHENSIVE METABOLIC PANEL
ALT: 16 U/L (ref 0–44)
AST: 21 U/L (ref 15–41)
Albumin: 3.6 g/dL (ref 3.5–5.0)
Alkaline Phosphatase: 69 U/L (ref 38–126)
Anion gap: 9 (ref 5–15)
BUN: 19 mg/dL (ref 8–23)
CO2: 29 mmol/L (ref 22–32)
Calcium: 9.6 mg/dL (ref 8.9–10.3)
Chloride: 101 mmol/L (ref 98–111)
Creatinine, Ser: 0.74 mg/dL (ref 0.44–1.00)
GFR calc Af Amer: >60 mL/min (ref >60)
GFR calc non Af Amer: >60 mL/min (ref >60)
Glucose, Bld: 167 mg/dL — ABNORMAL HIGH (ref 70–99)
Potassium: 3.5 mmol/L (ref 3.5–5.1)
Sodium: 139 mmol/L (ref 135–145)
Total Bilirubin: 0.4 mg/dL (ref 0.3–1.2)
Total Protein: 6.9 g/dL (ref 6.5–8.1)

## 2018-06-07 LAB — ETHANOL: Alcohol, Ethyl (B): <10 mg/dL (ref <10)

## 2018-06-07 LAB — CBC
HCT: 39.9 % (ref 36.0–46.0)
Hemoglobin: 12.6 g/dL (ref 12.0–15.0)
MCH: 28.3 pg (ref 26.0–34.0)
MCHC: 31.6 g/dL (ref 30.0–36.0)
MCV: 89.5 fL (ref 80.0–100.0)
Platelets: 231 10*3/uL (ref 150–400)
RBC: 4.46 MIL/uL (ref 3.87–5.11)
RDW: 14.4 % (ref 11.5–15.5)
WBC: 7.4 10*3/uL (ref 4.0–10.5)
nRBC: 0 % (ref 0.0–0.2)

## 2018-06-07 LAB — RAPID URINE DRUG SCREEN, HOSP PERFORMED
Amphetamines: NOT DETECTED
Barbiturates: NOT DETECTED
Benzodiazepines: NOT DETECTED
Cocaine: NOT DETECTED
Opiates: NOT DETECTED
Tetrahydrocannabinol: NOT DETECTED

## 2018-06-07 LAB — DIFFERENTIAL
Abs Immature Granulocytes: 0.04 10*3/uL (ref 0.00–0.07)
Basophils Absolute: 0.1 10*3/uL (ref 0.0–0.1)
Basophils Relative: 1 %
Eosinophils Absolute: 0.1 10*3/uL (ref 0.0–0.5)
Eosinophils Relative: 2 %
Immature Granulocytes: 1 %
Lymphocytes Relative: 27 %
Lymphs Abs: 2 10*3/uL (ref 0.7–4.0)
Monocytes Absolute: 0.6 10*3/uL (ref 0.1–1.0)
Monocytes Relative: 8 %
Neutro Abs: 4.6 10*3/uL (ref 1.7–7.7)
Neutrophils Relative %: 61 %

## 2018-06-07 LAB — APTT: aPTT: 30 seconds (ref 24–36)

## 2018-06-07 LAB — PROTIME-INR
INR: 1 (ref 0.8–1.2)
Prothrombin Time: 12.6 seconds (ref 11.4–15.2)

## 2018-06-07 MED ORDER — HEPARIN (PORCINE) 25000 UT/250ML-% IV SOLN
1050.0000 [IU]/h | INTRAVENOUS | Status: DC
  Administered 2018-06-07: 900 [IU]/h via INTRAVENOUS
  Filled 2018-06-07: qty 250

## 2018-06-07 MED ORDER — GADOBUTROL 1 MMOL/ML IV SOLN
7.0000 mL | Freq: Once | INTRAVENOUS | Status: AC | PRN
  Administered 2018-06-07: 7 mL via INTRAVENOUS

## 2018-06-07 MED ORDER — SODIUM CHLORIDE 0.9 % IV SOLN
INTRAVENOUS | Status: DC | PRN
  Administered 2018-06-07: 500 mL via INTRAVENOUS

## 2018-06-07 NOTE — H&P (Signed)
History and Physical   Haley Wilcox RXV:[id number] DOB: 1931/10/10 DOA: 06/07/2018  Referring MD/NP/PA: Dr. Dorie Rank  PCP: Seward Carol, MD   Outpatient Specialists: None   Patient coming from: Home  Chief Complaint: Aphasia with some dizziness  HPI: Haley Wilcox is a 85 y.o. female with medical history significant of diabetes, hypertension, hyperlipidemia, iron deficiency anemia who presented to the ER with sudden onset of aphasia around 3:35 PM.  She was with her daughter on the phone when it happened.  She complained of dizziness.  Daughter brought her to the ER where she was seen and evaluated.  Patient was found to have difficulty forming words and was spacing out in the ER.  Setting with a code stroke.  Patient not a candidate for TPA.  Initial CT was negative.  Patient had another episode while in the ER and repeat CT scan head is ongoing.  She has been admitted for TIA work-up.  ED Course: Temperature 98.8 blood pressure was 200/87 pulse 97 respiratory rate oxygen 97% room air.  CBC and chemistry appeared to be within normal.  EKG showed atrial fibrillation with a rate of 72.  There is baseline wander in leads.  New A. fib.  Head CT without contrast negative.  MRI is ordered and patient is being admitted for work-up.  Review of Systems: As per HPI otherwise 10 point review of systems negative.    Past Medical History:  Diagnosis Date  . Constipation   . Diabetes mellitus   . Gout   . Hyperlipidemia   . Hypertension     Past Surgical History:  Procedure Laterality Date  . ABDOMINAL HYSTERECTOMY    . d &c    . DILATION AND CURETTAGE OF UTERUS    . EYE SURGERY     bilateral cataract  . TUBAL LIGATION       reports that she has never smoked. She has never used smokeless tobacco. She reports that she does not drink alcohol or use drugs.  Allergies  Allergen Reactions  . Fluoride Preparations Swelling    "Lips swell" when brushing teeth with toothpaste that  has flouride   . Ivp Dye [Iodinated Diagnostic Agents] Rash    Family History  Problem Relation Age of Onset  . Hypertension Mother   . Heart disease Father      Prior to Admission medications   Medication Sig Start Date End Date Taking? Authorizing Provider  amLODipine (NORVASC) 10 MG tablet Take 10 mg by mouth every evening.  03/23/18  Yes [provider]  carvedilol (COREG) 12.5 MG tablet Take 12.5 mg by mouth 2 (two) times daily. 05/28/18  Yes [provider]  COLCRYS 0.6 MG tablet Take 0.6 mg by mouth every evening.  04/25/18  Yes [provider]  diphenhydramine-acetaminophen (TYLENOL PM EXTRA STRENGTH) 25-500 MG TABS tablet Take 1 tablet by mouth at bedtime as needed (sleep).    Yes [provider]  doxazosin (CARDURA) 2 MG tablet Take 2 mg by mouth every evening.    Yes [provider]  levothyroxine (SYNTHROID, LEVOTHROID) 25 MCG tablet Take 25 mcg by mouth daily. 05/03/18  Yes [provider]  losartan-hydrochlorothiazide (HYZAAR) 100-25 MG tablet Take 1 tablet by mouth daily.   Yes [provider]  metFORMIN (GLUCOPHAGE) 500 MG tablet TAKE 1 TABLET DAILY WITH BREAKFAST Patient taking differently: Take 500 mg by mouth daily with breakfast.  09/11/14  Yes Marin Olp, MD  Multiple Vitamins-Minerals (MULTIVITAMIN GUMMIES  ADULT) CHEW Chew 2 each by mouth daily.   Yes [provider]  omeprazole (PRILOSEC) 20 MG capsule TAKE 1 CAPSULE DAILY AS NEEDED (HEART BURN) Patient taking differently: Take 20 mg by mouth daily as needed (heart burn).  06/22/17  Yes Alene Mires, Sahar M, PA-C  polyethylene glycol powder (GLYCOLAX/MIRALAX) powder Mix 17gm in 4oz of water.  Start with 2 doses per day, but may take up to 6 doses per day.  Titrate number of doses to allow 2-3 soft bowel movements daily. Patient taking differently: Take 17 g by mouth daily. Mix 17gm in 4oz of water 06/06/12  Yes Short, Noah Delaine, MD  carvedilol (COREG) 6.25  MG tablet Take 1 tablet (6.25 mg total) by mouth 2 (two) times daily with a meal. Patient not taking: Reported on 06/07/2018 06/22/17   Waldon Merl, PA-C  Vitamin D, Ergocalciferol, (DRISDOL) 50000 units CAPS capsule Take 1 capsule (50,000 Units total) by mouth every 7 (seven) days. Patient not taking: Reported on 06/07/2018 09/21/17   Waldon Merl, [place]    Physical Exam: Vitals:   06/07/18 1715 06/07/18 1730 06/07/18 1805 06/07/18 1810  BP: (!) 114/91 (!) 162/81 (!) 190/80 (!) 181/84  Pulse: (!) 38 70 97 76  Resp: 18 14 16 17   Temp:      TempSrc:      SpO2: 97% 100% 100% 98%      Constitutional: NAD, calm, comfortable Vitals:   06/07/18 1715 06/07/18 1730 06/07/18 1805 06/07/18 1810  BP: (!) 114/91 (!) 162/81 (!) 190/80 (!) 181/84  Pulse: (!) 38 70 97 76  Resp: 18 14 16 17   Temp:      TempSrc:      SpO2: 97% 100% 100% 98%   Eyes: PERRL, lids and conjunctivae normal ENMT: Mucous membranes are moist. Posterior pharynx clear of any exudate or lesions.Normal dentition.  Neck: normal, supple, no masses, no thyromegaly Respiratory: clear to auscultation bilaterally, no wheezing, no crackles. Normal respiratory effort. No accessory muscle use.  Cardiovascular: Regular rate and rhythm, no murmurs / rubs / gallops. No extremity edema. 2+ pedal pulses. No carotid bruits.  Abdomen: no tenderness, no masses palpated. No hepatosplenomegaly. Bowel sounds positive.  Musculoskeletal: no clubbing / cyanosis. No joint deformity upper and lower extremities. Good ROM, no contractures. Normal muscle tone.  Skin: no rashes, lesions, ulcers. No induration Neurologic: CN 2-12 grossly intact. Sensation intact, DTR normal. Strength 5/5 in all 4.  Slight word searching and  Psychiatric: Normal judgment and insight. Alert and oriented x 3. Normal mood.     Labs on Admission: I have personally reviewed following labs and imaging studies  CBC: Recent Labs  Lab 06/07/18 1622  WBC 7.4  NEUTROABS 4.6   HGB 12.6  HCT 39.9  MCV 89.5  PLT 854   Basic Metabolic Panel: Recent Labs  Lab 06/07/18 1622 06/07/18 1630  NA 139  --   K 3.5  --   CL 101  --   CO2 29  --   GLUCOSE 167*  --   BUN 19  --   CREATININE 0.74 0.80  CALCIUM 9.6  --    GFR: CrCl cannot be calculated (Unknown ideal weight.). Liver Function Tests: Recent Labs  Lab 06/07/18 1622  AST 21  ALT 16  ALKPHOS 69  BILITOT 0.4  PROT 6.9  ALBUMIN 3.6   No results for input(s): LIPASE, AMYLASE in the last 168 hours. No results for input(s): AMMONIA in the last 168 hours. Coagulation Profile:  Recent Labs  Lab 06/07/18 1622  INR 1.0   Cardiac Enzymes: No results for input(s): CKTOTAL, CKMB, CKMBINDEX, TROPONINI in the last 168 hours. BNP (last 3 results) No results for input(s): PROBNP in the last 8760 hours. HbA1C: No results for input(s): HGBA1C in the last 72 hours. CBG: No results for input(s): GLUCAP in the last 168 hours. Lipid Profile: No results for input(s): CHOL, HDL, LDLCALC, TRIG, CHOLHDL, LDLDIRECT in the last 72 hours. Thyroid Function Tests: No results for input(s): TSH, T4TOTAL, FREET4, T3FREE, THYROIDAB in the last 72 hours. Anemia Panel: No results for input(s): VITAMINB12, FOLATE, FERRITIN, TIBC, IRON, RETICCTPCT in the last 72 hours. Urine analysis:    Component Value Date/Time   COLORURINE STRAW (A) 06/07/2018 1720   APPEARANCEUR CLEAR 06/07/2018 1720   LABSPEC 1.005 06/07/2018 1720   PHURINE 7.0 06/07/2018 1720   GLUCOSEU NEGATIVE 06/07/2018 1720   HGBUR NEGATIVE 06/07/2018 1720   BILIRUBINUR NEGATIVE 06/07/2018 1720   KETONESUR NEGATIVE 06/07/2018 1720   PROTEINUR >=300 (A) 06/07/2018 1720   UROBILINOGEN 1.0 06/01/2012 1551   NITRITE NEGATIVE 06/07/2018 1720   LEUKOCYTESUR NEGATIVE 06/07/2018 1720   Sepsis Labs: @LABRCNTIP (procalcitonin:4,lacticidven:4) )No results found for this or any previous visit (from the past 240 hour(s)).   Radiological Exams on Admission: Ct  Head Wo Contrast  Result Date: 06/07/2018 CLINICAL DATA:  Altered speech, dizzy EXAM: CT HEAD WITHOUT CONTRAST TECHNIQUE: Contiguous axial images were obtained from the base of the skull through the vertex without intravenous contrast. COMPARISON:  CT 08/16/2014 FINDINGS: Brain: No evidence of acute infarction, hemorrhage, hydrocephalus, extra-axial collection or mass lesion/mass effect. Vascular: No hyperdense vessels.  Carotid vascular calcification Skull: Normal. Negative for fracture or focal lesion. Sinuses/Orbits: Mucosal thickening in the ethmoid sinuses. Mucous retention cyst left maxillary sinus Other: None IMPRESSION: Negative non contrasted CT appearance of the brain Electronically Signed   By: Donavan Foil M.D.   On: 06/07/2018 17:01    EKG: Independently reviewed.  Atrial fibrillation with a rate of 72, nonspecific ST changes, some low voltage  Assessment/Plan Principal Problem:   TIA (transient ischemic attack) Active Problems:   Diabetes mellitus type II, controlled ([place])   Essential hypertension   Hyperlipidemia     #1 acute TIAs versus CVA: Patient has negative head CT so far.  Neurology consulted.  Patient is being admitted for work-up.  MRI of the brain, carotid Dopplers as well as echocardiogram will be ordered.  Initiate patient on aspirin and statin.  Patient most likely will need to be anticoagulated with a new onset atrial fibrillation which may be high risk factor.  #2 new onset atrial fibrillation: This is noted on patient's EKG.  This could be the main risk factor.  Patient will need to be on anticoagulation once cleared by neurology  #3 diabetes: Blood sugars appears controlled.  Sliding scale insulin will be added to her her current regimen.  #4 hyperlipidemia: Place patient on statin and will continue monitoring.  She needs to on high intensity statin.  #5 hypertension: Very well controlled.  Continue treatment.   DVT prophylaxis: Heparin Code Status: Full  code Family Communication: Daughter at bedside Disposition Plan: To be determined Consults called: Neurology Admission status: Observation  Severity of Illness: The appropriate patient status for this patient is INPATIENT. Inpatient status is judged to be reasonable and necessary in order to provide the required intensity of service to ensure the patient's safety. The patient's presenting symptoms, physical exam findings, and initial radiographic and laboratory data  in the context of their chronic comorbidities is felt to place them at high risk for further clinical deterioration. Furthermore, it is not anticipated that the patient will be medically stable for discharge from the hospital within 2 midnights of admission. The following factors support the patient status of inpatient.   " The patient's presenting symptoms include aphasia and dizziness. " The worrisome physical exam findings include no significant findings on exam except word finding difficulty. " The initial radiographic and laboratory data are worrisome because of new atrial fibrillation on EKG. " The chronic co-morbidities include hypertension with diabetes.   * I certify that at the point of admission it is my clinical judgment that the patient will require inpatient hospital care spanning beyond 2 midnights from the point of admission due to high intensity of service, high risk for further deterioration and high frequency of surveillance required.Barbette Merino MD [facility] Pager [phone]  If 7PM-7AM, please contact night-coverage [email or web address] Password [facility]  06/07/2018, 6:19 PM

## 2018-06-07 NOTE — ED Provider Notes (Signed)
[facility] DEPT Provider Note   CSN: [id number] Arrival date & time: 06/07/18  1551    History   Chief Complaint Chief Complaint  Patient presents with   Dizziness   Aphasia    HPI Haley Wilcox is a 85 y.o. female with history of diabetes mellitus, hypertension, hyperlipidemia, iron deficiency anemia presents for evaluation of acute onset, resolved episode of dizziness and aphasia.  Patient's daughter reports that she was talking to her on the phone without any difficulties when at around 3:35 PM the patient began to complain of dizziness.  The patient reports that she was writing on a piece of paper and "it started to look weird ".  She denied room spinning sensation.  Daughter also noted that the patient was having difficulty forming words and "seemed to be spacing out ".  The daughter then rushed to the patient's home and noted that she appeared to be having difficulty forming words.  The symptoms lasted approximately 30 minutes or so before resolving.  While at home they checked the patient's blood pressure which was noted to be elevated and so she took some of her hypertension medications.  Her daughter noted that she had some difficulty swallowing while taking these medications that the patient denies this.  Presently the patient denies any dizziness, slurred speech, facial droop, headaches, numbness, vision changes, weakness, chest pain, shortness of breath, abdominal pain, nausea, or vomiting.  Of note, the patient was recently supposed to start a statin for hyperlipidemia but she has not been taking this because she was fearful that it might interact with her thyroid medication.     The history is provided by the patient and a relative.    Past Medical History:  Diagnosis Date   Constipation    Diabetes mellitus    Gout    Hyperlipidemia    Hypertension     Patient Active Problem List   Diagnosis Date Noted   TIA (transient  ischemic attack) 06/07/2018   A-fib ([place]) 06/07/2018   Other fatigue 03/30/2017   Type 2 diabetes mellitus without complication, without long-term current use of insulin ([place]) 03/30/2017   Vitamin D deficiency 03/30/2017   Idiopathic gout of left knee 06/06/2012   Iron deficiency anemia 06/03/2012   Sinus tachycardia 06/02/2012   Hyperlipidemia 10/23/2010   Diabetes mellitus type II, controlled ([place]) 11/23/2006   Essential hypertension 10/13/2006    Past Surgical History:  Procedure Laterality Date   ABDOMINAL HYSTERECTOMY     d &c     DILATION AND CURETTAGE OF UTERUS     EYE SURGERY     bilateral cataract   TUBAL LIGATION       OB History    Gravida  2   Para  2   Term      Preterm      AB      Living        SAB      TAB      Ectopic      Multiple      Live Births               Home Medications    Prior to Admission medications   Medication Sig Start Date End Date Taking? Authorizing Provider  amLODipine (NORVASC) 10 MG tablet Take 10 mg by mouth every evening.  03/23/18  Yes [provider]  carvedilol (COREG) 12.5 MG tablet Take 12.5 mg by mouth 2 (two) times daily. 05/28/18  Yes [provider]  COLCRYS 0.6 MG tablet Take 0.6 mg by mouth every evening.  04/25/18  Yes [provider]  diphenhydramine-acetaminophen (TYLENOL PM EXTRA STRENGTH) 25-500 MG TABS tablet Take 1 tablet by mouth at bedtime as needed (sleep).    Yes [provider]  doxazosin (CARDURA) 2 MG tablet Take 2 mg by mouth every evening.    Yes [provider]  levothyroxine (SYNTHROID, LEVOTHROID) 25 MCG tablet Take 25 mcg by mouth daily. 05/03/18  Yes [provider]  losartan-hydrochlorothiazide (HYZAAR) 100-25 MG tablet Take 1 tablet by mouth daily.   Yes [provider]  metFORMIN (GLUCOPHAGE) 500 MG tablet TAKE 1 TABLET DAILY WITH BREAKFAST Patient taking differently: Take 500 mg by mouth daily with  breakfast.  09/11/14  Yes Marin Olp, MD  Multiple Vitamins-Minerals (MULTIVITAMIN GUMMIES ADULT) CHEW Chew 2 each by mouth daily.   Yes [provider]  omeprazole (PRILOSEC) 20 MG capsule TAKE 1 CAPSULE DAILY AS NEEDED (HEART BURN) Patient taking differently: Take 20 mg by mouth daily as needed (heart burn).  06/22/17  Yes Alene Mires, Sahar M, PA-C  polyethylene glycol powder (GLYCOLAX/MIRALAX) powder Mix 17gm in 4oz of water.  Start with 2 doses per day, but may take up to 6 doses per day.  Titrate number of doses to allow 2-3 soft bowel movements daily. Patient taking differently: Take 17 g by mouth daily. Mix 17gm in 4oz of water 06/06/12  Yes Short, Noah Delaine, MD  carvedilol (COREG) 6.25 MG tablet Take 1 tablet (6.25 mg total) by mouth 2 (two) times daily with a meal. Patient not taking: Reported on 06/07/2018 06/22/17   Waldon Merl, PA-C  Vitamin D, Ergocalciferol, (DRISDOL) 50000 units CAPS capsule Take 1 capsule (50,000 Units total) by mouth every 7 (seven) days. Patient not taking: Reported on 06/07/2018 09/21/17   Waldon Merl, PA-C    Family History Family History  Problem Relation Age of Onset   Hypertension Mother    Heart disease Father     Social History Social History   Tobacco Use   Smoking status: Never Smoker   Smokeless tobacco: Never Used  Substance Use Topics   Alcohol use: No    Alcohol/week: 0.0 standard drinks   Drug use: No     Allergies   Fluoride preparations and Ivp dye [iodinated diagnostic agents]   Review of Systems Review of Systems  Constitutional: Negative for chills and fever.  Eyes: Negative for photophobia and visual disturbance.  Respiratory: Negative for shortness of breath.   Cardiovascular: Negative for chest pain.  Gastrointestinal: Negative for abdominal pain, nausea and vomiting.  Neurological: Positive for dizziness and speech difficulty. Negative for headaches.  All other systems reviewed and are  negative.    Physical Exam Updated Vital Signs BP (!) 156/85    Pulse 76    Temp 98.8 F (37.1 C) (Oral)    Resp 18    Ht 5\' 2"  (1.575 m)    SpO2 98%    BMI 29.63 kg/m   Physical Exam Vitals signs and nursing note reviewed.  Constitutional:      General: She is not in acute distress.    Appearance: She is well-developed.  HENT:     Head: Normocephalic and atraumatic.  Eyes:     General:        Right eye: No discharge.        Left eye: No discharge.     Conjunctiva/sclera: Conjunctivae normal.  Neck:  Musculoskeletal: Normal range of motion and neck supple.     Vascular: No JVD.     Trachea: No tracheal deviation.  Cardiovascular:     Rate and Rhythm: Normal rate. Rhythm irregular.     Pulses: Normal pulses.     Heart sounds: Normal heart sounds.  Pulmonary:     Effort: Pulmonary effort is normal.     Breath sounds: Normal breath sounds.  Abdominal:     General: Abdomen is flat. There is no distension.     Tenderness: There is no abdominal tenderness. There is no guarding or rebound.  Musculoskeletal: Normal range of motion.  Skin:    General: Skin is warm and dry.     Findings: No erythema.  Neurological:     General: No focal deficit present.     Mental Status: She is alert and oriented to person, place, and time.     Comments: Mental Status:  Alert, thought content appropriate, able to give a coherent history. Speech fluent without evidence of aphasia. Able to follow 2 step commands without difficulty.  Cranial Nerves:  II:  Peripheral visual fields grossly normal, pupils equal, round, reactive to light III,IV, VI: ptosis not present, extra-ocular motions intact bilaterally  V,VII: smile symmetric, facial light touch sensation equal VIII: hearing grossly normal to voice  X: uvula elevates symmetrically  XI: bilateral shoulder shrug symmetric and strong XII: midline tongue extension without fassiculations Motor:  Normal tone. 5/5 strength of BUE and BLE major  muscle groups including strong and equal grip strength and dorsiflexion/plantar flexion, no pronator drift. Sensory: light touch normal in all extremities. Cerebellar: normal finger-to-nose with bilateral upper extremities, romberg sign absent Gait: normal gait and balance. Able to walk on toes and heels with ease.  CV: 2+ radial and DP/PT pulses   Psychiatric:        Behavior: Behavior normal.      ED Treatments / Results  Labs (all labs ordered are listed, but only abnormal results are displayed) Labs Reviewed  COMPREHENSIVE METABOLIC PANEL - Abnormal; Notable for the following components:      Result Value   Glucose, Bld 167 (*)    All other components within normal limits  URINALYSIS, ROUTINE W REFLEX MICROSCOPIC - Abnormal; Notable for the following components:   Color, Urine STRAW (*)    Protein, ur >=300 (*)    All other components within normal limits  ETHANOL  PROTIME-INR  APTT  CBC  DIFFERENTIAL  RAPID URINE DRUG SCREEN, HOSP PERFORMED  CBC  HEPARIN LEVEL (UNFRACTIONATED)  I-STAT CREATININE, ED    EKG EKG Interpretation  Date/Time:  Tuesday June 07 2018 16:02:13 EDT Ventricular Rate:  72 PR Interval:    QRS Duration: 84 QT Interval:  374 QTC Calculation: 410 R Axis:   63 Text Interpretation:  Atrial fibrillation Low voltage, precordial leads Nonspecific T abnormalities, diffuse leads Baseline wander in lead(s) III atrial fibrillation is new since last tracing Confirmed by Dorie Rank [phone]) on 06/07/2018 4:18:36 PM   Radiology Ct Head Wo Contrast  Result Date: 06/07/2018 CLINICAL DATA:  Altered speech, dizzy EXAM: CT HEAD WITHOUT CONTRAST TECHNIQUE: Contiguous axial images were obtained from the base of the skull through the vertex without intravenous contrast. COMPARISON:  CT 08/16/2014 FINDINGS: Brain: No evidence of acute infarction, hemorrhage, hydrocephalus, extra-axial collection or mass lesion/mass effect. Vascular: No hyperdense vessels.  Carotid  vascular calcification Skull: Normal. Negative for fracture or focal lesion. Sinuses/Orbits: Mucosal thickening in the ethmoid sinuses. Mucous retention  cyst left maxillary sinus Other: None IMPRESSION: Negative non contrasted CT appearance of the brain Electronically Signed   By: Donavan Foil M.D.   On: 06/07/2018 17:01   Ct Head Code Stroke Wo Contrast`  Result Date: 06/07/2018 CLINICAL DATA:  Code stroke.    Inability to speak 715 tonight EXAM: CT HEAD WITHOUT CONTRAST TECHNIQUE: Contiguous axial images were obtained from the base of the skull through the vertex without intravenous contrast. COMPARISON:  CT head 06/07/2018 FINDINGS: Brain: Ventricle size and cerebral volume normal. Mild hypodensity in the periventricular white matter appears chronic. Negative for acute infarct, hemorrhage, mass Vascular: Negative for hyperdense vessel. Atherosclerotic calcification. Skull: Negative Sinuses/Orbits: Mild mucosal edema paranasal sinuses. Bilateral cataract surgery Other: None ASPECTS ([place] Stroke Program Early CT Score) - Ganglionic level infarction (caudate, lentiform nuclei, internal capsule, insula, M1-M3 cortex): 7 - Supraganglionic infarction (M4-M6 cortex): 3 Total score (0-10 with 10 being normal): 10 IMPRESSION: 1. No acute intracranial abnormality. Mild chronic white matter disease 2. ASPECTS is 10 3. These results were called by telephone at the time of interpretation on 06/07/2018 at 7:52 pm to Dr. Dorie Rank , who verbally acknowledged these results. Electronically Signed   By: Franchot Gallo M.D.   On: 06/07/2018 19:53    Procedures Procedures (including critical care time)  Medications Ordered in ED Medications  heparin ADULT infusion 100 units/mL (25000 units/24mL sodium chloride 0.45%) (has no administration in time range)  gadobutrol (GADAVIST) 1 MMOL/ML injection 7 mL (7 mLs Intravenous Contrast Given 06/07/18 2055)     Initial Impression / Assessment and Plan / ED Course  I  have reviewed the triage vital signs and the nursing notes.  Pertinent labs & imaging results that were available during my care of the patient were reviewed by me and considered in my medical decision making (see chart for details).        Patient brought in by daughter for evaluation of an episode of aphasia and dizziness.  The patient is afebrile, hypertensive in the ED.  Nontoxic in appearance.  Normal neurologic examination on my assessment.  EKG shows atrial fibrillation which appears new onset but rate controlled.  Suspect TIA, will order head CT and lab work for further evaluation.  Head CT shows no acute intracranial abnormalities.  Labs show no leukocytosis, no anemia, no metabolic derangements.  She does have some proteinuria.  Patient resting comfortably no apparent distress with no recurrence in strokelike symptoms.  Spoke with Dr. Jeannie Fend with [facility] who agrees to assume care of patient and bring her into the hospital for TIA work-up.  7:30PM RN informed me that patient's daughter came out of the room to report the patient was having another episode of aphasia.  On examination she is aphasic, no other neuro deficits.  Code stroke was initiated and Dr. Tomi Bamberger spoke with a neurologist who recommended obtaining a repeat code stroke head CT.  Symptoms resolved within approximately 5 minutes after onset.Plan to heparinize given new onset a-fib in the setting of CVA vs TIA.    Final Clinical Impressions(s) / ED Diagnoses   Final diagnoses:  Aphasia    ED Discharge Orders    None       Debroah Baller 06/07/18 2130    Dorie Rank, MD 06/08/18 1510

## 2018-06-07 NOTE — ED Notes (Signed)
Patient assisted to use bedpan at this time.

## 2018-06-07 NOTE — ED Notes (Signed)
[place] applied to patient.

## 2018-06-07 NOTE — ED Provider Notes (Signed)
Medical screening examination/treatment/procedure(s) were conducted as a shared visit with non-physician practitioner(s) and myself.  I personally evaluated the patient during the encounter.  EKG Interpretation  Date/Time:  Tuesday June 07 2018 16:02:13 EDT Ventricular Rate:  72 PR Interval:    QRS Duration: 84 QT Interval:  374 QTC Calculation: 410 R Axis:   63 Text Interpretation:  Atrial fibrillation Low voltage, precordial leads Nonspecific T abnormalities, diffuse leads Baseline wander in lead(s) III atrial fibrillation is new since last tracing Confirmed by Dorie Rank [phone]) on 06/07/2018 4:18:36 PM  Initially sx had all resolved.   Plan was for admission for TIA workup.  While in the ED still at [facility] pt had reported recurrent aphasia.  Code stroke activated.   Dorie Rank, MD 06/07/18 [phone]

## 2018-06-07 NOTE — ED Notes (Signed)
ED TO INPATIENT HANDOFF REPORT  ED Nurse Name and Phone #: [phone] [facility] RN  S Name/Age/Gender Haley Wilcox 85 y.o. female Room/Bed: WA15/WA15  Code Status   Code Status: Prior  Home/SNF/Other Home Patient oriented to: self, place, time and situation Is this baseline? Yes   Triage Complete: Triage complete  Chief Complaint dizziness, aphasia  Triage Note Pt's daughter states that she was on phone with patient about 10-20 minutes ago when pt started feeling dizzy and and then couldn't get her words-delayed response.    Allergies Allergies  Allergen Reactions  . Fluoride Preparations Swelling    "Lips swell" when brushing teeth with toothpaste that has flouride   . Ivp Dye [Iodinated Diagnostic Agents] Rash    Level of Care/Admitting Diagnosis ED Disposition    ED Disposition Condition Comment   Admit  Hospital Area: [facility] [[id number]]  Level of Care: Telemetry [5]  Admit to tele based on following criteria: Eval of Syncope  Diagnosis: TIA (transient ischemic attack) [974163]  Admitting Physician: Elwyn Reach [[id number]]  Attending Physician: Elwyn Reach [[id number]]  PT Class (Do Not Modify): Observation [104]  PT Acc Code (Do Not Modify): Observation [10022]       B Medical/Surgery History Past Medical History:  Diagnosis Date  . Constipation   . Diabetes mellitus   . Gout   . Hyperlipidemia   . Hypertension    Past Surgical History:  Procedure Laterality Date  . ABDOMINAL HYSTERECTOMY    . d &c    . DILATION AND CURETTAGE OF UTERUS    . EYE SURGERY     bilateral cataract  . TUBAL LIGATION       A IV Location/Drains/Wounds Patient Lines/Drains/Airways Status   Active Line/Drains/Airways    Name:   Placement date:   Placement time:   Site:   Days:   Peripheral IV 06/07/18 Left Antecubital   06/07/18    1852    Antecubital   less than 1          Intake/Output Last 24 hours No intake or output data in the 24  hours ending 06/07/18 1853  Labs/Imaging Results for orders placed or performed during the hospital encounter of 06/07/18 (from the past 48 hour(s))  Ethanol     Status: None   Collection Time: 06/07/18  4:22 PM  Result Value Ref Range   Alcohol, Ethyl (B) <10 <10 mg/dL    Comment: (NOTE) Lowest detectable limit for serum alcohol is 10 mg/dL. For medical purposes only. Performed at [facility], [place] [address] [place], [place] [address]   Protime-INR     Status: None   Collection Time: 06/07/18  4:22 PM  Result Value Ref Range   Prothrombin Time 12.6 11.4 - 15.2 seconds   INR 1.0 0.8 - 1.2    Comment: (NOTE) INR goal varies based on device and disease states. Performed at [facility], [place] [address] [place], [place] [address]   APTT     Status: None   Collection Time: 06/07/18  4:22 PM  Result Value Ref Range   aPTT 30 24 - 36 seconds    Comment: Performed at [facility], [place] [address], [place],  [address]  CBC     Status: None   Collection Time: 06/07/18  4:22 PM  Result Value Ref Range   WBC 7.4 4.0 - 10.5 K/uL   RBC 4.46 3.87 - 5.11 MIL/uL  Hemoglobin 12.6 12.0 - 15.0 g/dL   HCT 39.9 36.0 - 46.0 %   MCV 89.5 80.0 - 100.0 fL   MCH 28.3 26.0 - 34.0 pg   MCHC 31.6 30.0 - 36.0 g/dL   RDW 14.4 11.5 - 15.5 %   Platelets 231 150 - 400 K/uL   nRBC 0.0 0.0 - 0.2 %    Comment: Performed at [facility], [place] [address] [place], [place] [address]  Differential     Status: None   Collection Time: 06/07/18  4:22 PM  Result Value Ref Range   Neutrophils Relative % 61 %   Neutro Abs 4.6 1.7 - 7.7 K/uL   Lymphocytes Relative 27 %   Lymphs Abs 2.0 0.7 - 4.0 K/uL   Monocytes Relative 8 %   Monocytes Absolute 0.6 0.1 - 1.0 K/uL   Eosinophils Relative 2 %   Eosinophils Absolute 0.1 0.0 - 0.5 K/uL   Basophils Relative 1 %   Basophils Absolute 0.1 0.0 - 0.1 K/uL   Immature Granulocytes 1 %    Abs Immature Granulocytes 0.04 0.00 - 0.07 K/uL    Comment: Performed at [facility], [place] [address] [place], [place] [address]  Comprehensive metabolic panel     Status: Abnormal   Collection Time: 06/07/18  4:22 PM  Result Value Ref Range   Sodium 139 135 - 145 mmol/L   Potassium 3.5 3.5 - 5.1 mmol/L   Chloride 101 98 - 111 mmol/L   CO2 29 22 - 32 mmol/L   Glucose, Bld 167 (H) 70 - 99 mg/dL   BUN 19 8 - 23 mg/dL   Creatinine, Ser 0.74 0.44 - 1.00 mg/dL   Calcium 9.6 8.9 - 10.3 mg/dL   Total Protein 6.9 6.5 - 8.1 g/dL   Albumin 3.6 3.5 - 5.0 g/dL   AST 21 15 - 41 U/L   ALT 16 0 - 44 U/L   Alkaline Phosphatase 69 38 - 126 U/L   Total Bilirubin 0.4 0.3 - 1.2 mg/dL   GFR calc non Af Amer >60 >60 mL/min   GFR calc Af Amer >60 >60 mL/min   Anion gap 9 5 - 15    Comment: Performed at [facility], [place] [address], [place], [place] [address]  I-stat Creatinine, ED     Status: None   Collection Time: 06/07/18  4:30 PM  Result Value Ref Range   Creatinine, Ser 0.80 0.44 - 1.00 mg/dL  Urine rapid drug screen (hosp performed)     Status: None   Collection Time: 06/07/18  5:20 PM  Result Value Ref Range   Opiates NONE DETECTED NONE DETECTED   Cocaine NONE DETECTED NONE DETECTED   Benzodiazepines NONE DETECTED NONE DETECTED   Amphetamines NONE DETECTED NONE DETECTED   Tetrahydrocannabinol NONE DETECTED NONE DETECTED   Barbiturates NONE DETECTED NONE DETECTED    Comment: (NOTE) DRUG SCREEN FOR MEDICAL PURPOSES ONLY.  IF CONFIRMATION IS NEEDED FOR ANY PURPOSE, NOTIFY LAB WITHIN 5 DAYS. LOWEST DETECTABLE LIMITS FOR URINE DRUG SCREEN Drug Class                     Cutoff (ng/mL) Amphetamine and metabolites    1000 Barbiturate and metabolites    200 Benzodiazepine                 263 Tricyclics and metabolites     300 Opiates and metabolites  300 Cocaine and metabolites        300 THC                            50 Performed at [facility], [place] [address] [place], [place] [address]   Urinalysis, Routine w reflex microscopic     Status: Abnormal   Collection Time: 06/07/18  5:20 PM  Result Value Ref Range   Color, Urine STRAW (A) YELLOW   APPearance CLEAR CLEAR   Specific Gravity, Urine 1.005 1.005 - 1.030   pH 7.0 5.0 - 8.0   Glucose, UA NEGATIVE NEGATIVE mg/dL   Hgb urine dipstick NEGATIVE NEGATIVE   Bilirubin Urine NEGATIVE NEGATIVE   Ketones, ur NEGATIVE NEGATIVE mg/dL   Protein, ur >=300 (A) NEGATIVE mg/dL   Nitrite NEGATIVE NEGATIVE   Leukocytes,Ua NEGATIVE NEGATIVE   RBC / HPF 0-5 0 - 5 RBC/hpf   WBC, UA 0-5 0 - 5 WBC/hpf   Bacteria, UA NONE SEEN NONE SEEN   Squamous Epithelial / LPF 0-5 0 - 5    Comment: Performed at [facility], [place] [address], [place], [place] [address]   Ct Head Wo Contrast  Result Date: 06/07/2018 CLINICAL DATA:  Altered speech, dizzy EXAM: CT HEAD WITHOUT CONTRAST TECHNIQUE: Contiguous axial images were obtained from the base of the skull through the vertex without intravenous contrast. COMPARISON:  CT 08/16/2014 FINDINGS: Brain: No evidence of acute infarction, hemorrhage, hydrocephalus, extra-axial collection or mass lesion/mass effect. Vascular: No hyperdense vessels.  Carotid vascular calcification Skull: Normal. Negative for fracture or focal lesion. Sinuses/Orbits: Mucosal thickening in the ethmoid sinuses. Mucous retention cyst left maxillary sinus Other: None IMPRESSION: Negative non contrasted CT appearance of the brain Electronically Signed   By: Donavan Foil M.D.   On: 06/07/2018 17:01    Pending Labs [employer] (From admission, onward)    Start     Ordered   Signed and Held  Hemoglobin A1c  Tomorrow morning,   R     Signed and Held   Signed and Held  Lipid panel  Tomorrow morning,   R    Comments:  Fasting    Signed and Held   Signed and Held  CBC  (enoxaparin (LOVENOX)    CrCl >/= 30 ml/min)  Once,   R    Comments:  Baseline  for enoxaparin therapy IF NOT ALREADY DRAWN.  Notify MD if PLT < 100 K.    Signed and Held   Signed and Held  Creatinine, serum  (enoxaparin (LOVENOX)    CrCl >/= 30 ml/min)  Once,   R    Comments:  Baseline for enoxaparin therapy IF NOT ALREADY DRAWN.    Signed and Held   Signed and Held  Creatinine, serum  (enoxaparin (LOVENOX)    CrCl >/= 30 ml/min)  Weekly,   R    Comments:  while on enoxaparin therapy    Signed and Held   Signed and Held  CBC  Tomorrow morning,   R     Signed and Held   Signed and Held  Comprehensive metabolic panel  Tomorrow morning,   R     Signed and Held          Vitals/Pain Today's Vitals   06/07/18 1730 06/07/18 1805 06/07/18 1810 06/07/18 1852  BP: (!) 162/81 (!) 190/80 (!) 181/84 (!) 172/78  Pulse: 70 97 76 77  Resp: 14 16  17 15  Temp:      TempSrc:      SpO2: 100% 100% 98% 99%    Isolation Precautions No active isolations  Medications Medications - No data to display  Mobility walks with person assist Low fall risk   Focused Assessments Neuro Assessment Handoff:  Swallow screen pass? Not done      Last date known well: 06/07/18 Last time known well: 1530 Neuro Assessment:   Neuro Checks:      Last Documented NIHSS Modified Score:   Has TPA been given? No If patient is a Neuro Trauma and patient is going to OR before floor call report to [place] nurse: [phone] or [phone]     R Recommendations: See Admitting Provider Note  Report given to:   Additional Notes:

## 2018-06-07 NOTE — ED Notes (Signed)
Patient transported to CT 

## 2018-06-07 NOTE — ED Notes (Signed)
Swallow screen done by B.Rolena Infante RN Not [facility]

## 2018-06-07 NOTE — ED Notes (Signed)
Called Dr Jonelle Sidle for RN Mortimer Fries @[phone] 

## 2018-06-07 NOTE — Progress Notes (Signed)
ANTICOAGULATION CONSULT NOTE - Initial Consult  Pharmacy Consult for Heparin Indication: New onset AFib in the setting of CVA vs TIA  Allergies  Allergen Reactions  . Fluoride Preparations Swelling    "Lips swell" when brushing teeth with toothpaste that has flouride   . Ivp Dye [Iodinated Diagnostic Agents] Rash    Patient Measurements: Height: 5\' 2"  (157.5 cm) IBW/kg (Calculated) : 50.1 Heparin Dosing Weight:    Vital Signs: Temp: 98.8 F (37.1 C) (03/17 1601) Temp Source: Oral (03/17 1601) BP: 156/85 (03/17 1917) Pulse Rate: 76 (03/17 1917)  Labs: Recent Labs    06/07/18 1622 06/07/18 1630  HGB 12.6  --   HCT 39.9  --   PLT 231  --   APTT 30  --   LABPROT 12.6  --   INR 1.0  --   CREATININE 0.74 0.80    CrCl cannot be calculated (Unknown ideal weight.).   Medical History: Past Medical History:  Diagnosis Date  . Constipation   . Diabetes mellitus   . Gout   . Hyperlipidemia   . Hypertension     Medications:  No anticoagulation PTA  Assessment:  71yr female brought to ED c/o sudden aphasia with dizziness.  Initial CT noted as negative and pt determined not a candidate for tPA.  Patient continues to be worked up for acute TIAs vs CVA.  PMH significant for DM, HLD, HTN.  While in ED, patient found to be in new onset AFib.  Head CT noted no acute hemorrhage or infarct  Pharmacy consulted to dose IV heparin for AFib  Goal of Therapy:  Heparin level 0.3-0.5 units/ml Monitor platelets by anticoagulation protocol: Yes   Plan:   Actual body weight unable to be obtained at this time, so heparin dosing based off of most previously known weight of approx 160 pounds (73kg)  No heparin bolus due to work-up for TIA vs CVA  Begin IV heparin @ 900 units/hr  Check heparin level 8 hr after heparin started  Follow heparin level and CBC daily while on IV heparin  Everette Rank, PharmD 06/07/2018,9:03 PM

## 2018-06-07 NOTE — ED Notes (Signed)
EDP at bedside  

## 2018-06-07 NOTE — ED Triage Notes (Signed)
Pt's daughter states that she was on phone with patient about 10-20 minutes ago when pt started feeling dizzy and and then couldn't get her words-delayed response.

## 2018-06-07 NOTE — ED Notes (Signed)
cARElINK CALLED

## 2018-06-07 NOTE — Consult Note (Signed)
[facility] TeleNeurology Consult Services   Date of Service:   06/07/2018 19:37:48  Impression:     .  Rule Out Acute Ischemic Stroke,  ? Stuttering TIAs  Comments/Sign-Out: 85 yo F h/o L eye decreased vision since childhood, HTN, DM, HL, hypothyroidism p/w 2 episodes of aphasia today w improvement after 15 mins.  Mechanism of Stroke: Not Clear  Metrics: Last Known Well: 06/07/2018 19:15:00 [facility] Notification Time: 06/07/2018 19:37:48 Arrival Time: 06/07/2018 19:15:00 Stamp Time: 06/07/2018 19:37:48 Time First Login Attempt: 06/07/2018 19:44:09 Video Start Time: 06/07/2018 19:44:09  Symptoms: dizzy and speech abnormality NIHSS Start Assessment Time: 06/07/2018 19:50:00 Patient is not a candidate for tPA. Patient was not deemed candidate for tPA thrombolytics because of Resolved symptoms (no residual disabling symptoms). Video End Time: 06/07/2018 20:06:03  CT head showed no acute hemorrhage or acute core infarct.  Lower Likelihood of Large Vessel Occlusion but Following Stat Studies are Recommended  CTA Head and Neck ordered CT Perfusion ordered   Radiologist was not called back for review of advanced imaging because awaiting scan ED Physician notified of diagnostic impression and management plan on 06/07/2018 20:06:02  Our recommendations are outlined below.  Recommendations: If CTA H/N/ Perf shows LVO, will plan for NIR.     Marland Kitchen  Activate Stroke Protocol Admission/Order Set     .  Stroke/Telemetry Floor     .  Neuro Checks     .  Bedside Swallow Eval     .  DVT Prophylaxis     .  IV Fluids, Normal Saline     .  Head of Bed Below 30 Degrees     .  Euglycemia and Avoid Hyperthermia (PRN Acetaminophen)     .  Antiplatelet Therapy Recommended  Routine Consultation with [facility] for Follow up Care  Sign Out:     .  Discussed with Emergency Department  Provider    ------------------------------------------------------------------------------  History of Present Illness: Patient is a 85 year old Female.  Patient was brought by private transportation with symptoms of dizzy and speech abnormality  85 yo F h/o L eye decreased vision since childhood, HTN, DM, HL, hypothyroidism p/w 2 episodes of aphasia today w improvement after 15 mins.  CT head showed no acute hemorrhage or acute core infarct.  Last seen normal was within 4.5 hours. There is no history of Recent Anticoagulants.  Examination: BP(186/78), Blood Glucose(167) 1A: Level of Consciousness - Alert; keenly responsive + 0 1B: Ask Month and Age - Both Questions Right + 0 1C: Blink Eyes & Squeeze Hands - Performs Both Tasks + 0 2: Test Horizontal Extraocular Movements - Normal + 0 3: Test Visual Fields - Partial Hemianopia + 1 4: Test Facial Palsy (Use Grimace if Obtunded) - Normal symmetry + 0 5A: Test Left Arm Motor Drift - No Drift for 10 Seconds + 0 5B: Test Right Arm Motor Drift - No Drift for 10 Seconds + 0 6A: Test Left Leg Motor Drift - No Drift for 5 Seconds + 0 6B: Test Right Leg Motor Drift - No Drift for 5 Seconds + 0 7: Test Limb Ataxia (FNF/Heel-Shin) - No Ataxia + 0 8: Test Sensation - Mild-Moderate Loss: Less Sharp/More Dull + 1 9: Test Language/Aphasia - Normal; No aphasia + 0 10: Test Dysarthria - Normal + 0 11: Test Extinction/Inattention - No abnormality + 0  NIHSS Score: 2  Patient was informed the Neurology Consult would happen via TeleHealth consult by way of interactive audio and video telecommunications  and consented to receiving care in this manner.  Due to the immediate potential for life-threatening deterioration due to underlying acute neurologic illness, I spent 35 minutes providing critical care. This time includes time for face to face visit via telemedicine, review of medical records, imaging studies and discussion of findings with  providers, the patient and/or family.   Dr Launa Grill   [facility] [phone]   Case [id number]

## 2018-06-08 ENCOUNTER — Observation Stay (HOSPITAL_COMMUNITY): Payer: Medicare Other

## 2018-06-08 ENCOUNTER — Other Ambulatory Visit: Payer: Self-pay

## 2018-06-08 DIAGNOSIS — R29702 NIHSS score 2: Secondary | ICD-10-CM | POA: Diagnosis present

## 2018-06-08 DIAGNOSIS — E039 Hypothyroidism, unspecified: Secondary | ICD-10-CM | POA: Diagnosis present

## 2018-06-08 DIAGNOSIS — I482 Chronic atrial fibrillation, unspecified: Secondary | ICD-10-CM | POA: Diagnosis present

## 2018-06-08 DIAGNOSIS — Z79899 Other long term (current) drug therapy: Secondary | ICD-10-CM | POA: Diagnosis not present

## 2018-06-08 DIAGNOSIS — E118 Type 2 diabetes mellitus with unspecified complications: Secondary | ICD-10-CM | POA: Diagnosis not present

## 2018-06-08 DIAGNOSIS — Z7984 Long term (current) use of oral hypoglycemic drugs: Secondary | ICD-10-CM | POA: Diagnosis not present

## 2018-06-08 DIAGNOSIS — I361 Nonrheumatic tricuspid (valve) insufficiency: Secondary | ICD-10-CM

## 2018-06-08 DIAGNOSIS — Z79890 Hormone replacement therapy (postmenopausal): Secondary | ICD-10-CM | POA: Diagnosis not present

## 2018-06-08 DIAGNOSIS — I6523 Occlusion and stenosis of bilateral carotid arteries: Secondary | ICD-10-CM | POA: Diagnosis present

## 2018-06-08 DIAGNOSIS — I10 Essential (primary) hypertension: Secondary | ICD-10-CM | POA: Diagnosis present

## 2018-06-08 DIAGNOSIS — I4891 Unspecified atrial fibrillation: Secondary | ICD-10-CM | POA: Diagnosis not present

## 2018-06-08 DIAGNOSIS — I63422 Cerebral infarction due to embolism of left anterior cerebral artery: Secondary | ICD-10-CM | POA: Diagnosis present

## 2018-06-08 DIAGNOSIS — E1151 Type 2 diabetes mellitus with diabetic peripheral angiopathy without gangrene: Secondary | ICD-10-CM | POA: Diagnosis present

## 2018-06-08 DIAGNOSIS — Z8249 Family history of ischemic heart disease and other diseases of the circulatory system: Secondary | ICD-10-CM | POA: Diagnosis not present

## 2018-06-08 DIAGNOSIS — E785 Hyperlipidemia, unspecified: Secondary | ICD-10-CM | POA: Diagnosis present

## 2018-06-08 DIAGNOSIS — G459 Transient cerebral ischemic attack, unspecified: Secondary | ICD-10-CM

## 2018-06-08 DIAGNOSIS — R4701 Aphasia: Secondary | ICD-10-CM | POA: Diagnosis present

## 2018-06-08 DIAGNOSIS — H547 Unspecified visual loss: Secondary | ICD-10-CM | POA: Diagnosis present

## 2018-06-08 DIAGNOSIS — E876 Hypokalemia: Secondary | ICD-10-CM | POA: Diagnosis present

## 2018-06-08 DIAGNOSIS — I639 Cerebral infarction, unspecified: Secondary | ICD-10-CM | POA: Diagnosis present

## 2018-06-08 DIAGNOSIS — Z90710 Acquired absence of both cervix and uterus: Secondary | ICD-10-CM | POA: Diagnosis not present

## 2018-06-08 DIAGNOSIS — R42 Dizziness and giddiness: Secondary | ICD-10-CM | POA: Diagnosis not present

## 2018-06-08 DIAGNOSIS — D509 Iron deficiency anemia, unspecified: Secondary | ICD-10-CM | POA: Diagnosis present

## 2018-06-08 DIAGNOSIS — Z888 Allergy status to other drugs, medicaments and biological substances status: Secondary | ICD-10-CM | POA: Diagnosis not present

## 2018-06-08 LAB — CBC
HCT: 37.8 % (ref 36.0–46.0)
Hemoglobin: 12.9 g/dL (ref 12.0–15.0)
MCH: 28.9 pg (ref 26.0–34.0)
MCHC: 34.1 g/dL (ref 30.0–36.0)
MCV: 84.6 fL (ref 80.0–100.0)
Platelets: 235 10*3/uL (ref 150–400)
RBC: 4.47 MIL/uL (ref 3.87–5.11)
RDW: 14.1 % (ref 11.5–15.5)
WBC: 8.3 10*3/uL (ref 4.0–10.5)
nRBC: 0 % (ref 0.0–0.2)

## 2018-06-08 LAB — COMPREHENSIVE METABOLIC PANEL
ALT: 16 U/L (ref 0–44)
AST: 20 U/L (ref 15–41)
Albumin: 3.3 g/dL — ABNORMAL LOW (ref 3.5–5.0)
Alkaline Phosphatase: 67 U/L (ref 38–126)
Anion gap: 6 (ref 5–15)
BUN: 10 mg/dL (ref 8–23)
CHLORIDE: 105 mmol/L (ref 98–111)
CO2: 28 mmol/L (ref 22–32)
Calcium: 9.6 mg/dL (ref 8.9–10.3)
Creatinine, Ser: 0.65 mg/dL (ref 0.44–1.00)
GFR calc non Af Amer: >60 mL/min (ref >60)
Glucose, Bld: 132 mg/dL — ABNORMAL HIGH (ref 70–99)
Potassium: 3 mmol/L — ABNORMAL LOW (ref 3.5–5.1)
Sodium: 139 mmol/L (ref 135–145)
Total Bilirubin: 0.3 mg/dL (ref 0.3–1.2)
Total Protein: 6.6 g/dL (ref 6.5–8.1)

## 2018-06-08 LAB — LIPID PANEL
Cholesterol: 241 mg/dL — ABNORMAL HIGH (ref 0–200)
HDL: 53 mg/dL (ref >40)
LDL Cholesterol: 155 mg/dL — ABNORMAL HIGH (ref 0–99)
TRIGLYCERIDES: 167 mg/dL — AB (ref <150)
Total CHOL/HDL Ratio: 4.5 RATIO
VLDL: 33 mg/dL (ref 0–40)

## 2018-06-08 LAB — GLUCOSE, CAPILLARY
GLUCOSE-CAPILLARY: 128 mg/dL — AB (ref 70–99)
Glucose-Capillary: 107 mg/dL — ABNORMAL HIGH (ref 70–99)
Glucose-Capillary: 113 mg/dL — ABNORMAL HIGH (ref 70–99)
Glucose-Capillary: 114 mg/dL — ABNORMAL HIGH (ref 70–99)
Glucose-Capillary: 132 mg/dL — ABNORMAL HIGH (ref 70–99)

## 2018-06-08 LAB — HEMOGLOBIN A1C
Hgb A1c MFr Bld: 6.7 % — ABNORMAL HIGH (ref 4.8–5.6)
Mean Plasma Glucose: 145.59 mg/dL

## 2018-06-08 LAB — ECHOCARDIOGRAM COMPLETE
Height: 62 in
Weight: 2867.74 oz

## 2018-06-08 LAB — HEPARIN LEVEL (UNFRACTIONATED): Heparin Unfractionated: 0.25 IU/mL — ABNORMAL LOW (ref 0.30–0.70)

## 2018-06-08 MED ORDER — SODIUM CHLORIDE 0.9 % IV SOLN: INTRAVENOUS | Status: DC

## 2018-06-08 MED ORDER — COLCHICINE 0.6 MG PO TABS
0.6000 mg | ORAL_TABLET | Freq: Every evening | ORAL | Status: DC
  Administered 2018-06-08: 0.6 mg via ORAL
  Filled 2018-06-08: qty 1

## 2018-06-08 MED ORDER — ADULT MULTIVITAMIN W/MINERALS CH
1.0000 | ORAL_TABLET | Freq: Every day | ORAL | Status: DC
  Administered 2018-06-08 – 2018-06-09 (×2): 1 via ORAL
  Filled 2018-06-08 (×2): qty 1

## 2018-06-08 MED ORDER — SENNOSIDES-DOCUSATE SODIUM 8.6-50 MG PO TABS: 1.0000 | ORAL_TABLET | Freq: Every evening | ORAL | Status: DC | PRN

## 2018-06-08 MED ORDER — POLYETHYLENE GLYCOL 3350 17 GM/SCOOP PO POWD: 17.0000 g | Freq: Every day | ORAL | Status: DC

## 2018-06-08 MED ORDER — AMLODIPINE BESYLATE 10 MG PO TABS: 10.0000 mg | ORAL_TABLET | Freq: Every evening | ORAL | Status: DC

## 2018-06-08 MED ORDER — ACETAMINOPHEN 650 MG RE SUPP: 650.0000 mg | RECTAL | Status: DC | PRN

## 2018-06-08 MED ORDER — INSULIN ASPART 100 UNIT/ML ~~LOC~~ SOLN: 0.0000 [IU] | Freq: Three times a day (TID) | SUBCUTANEOUS | Status: DC

## 2018-06-08 MED ORDER — LOSARTAN POTASSIUM 50 MG PO TABS
100.0000 mg | ORAL_TABLET | Freq: Every day | ORAL | Status: DC
  Administered 2018-06-08: 100 mg via ORAL
  Filled 2018-06-08: qty 2

## 2018-06-08 MED ORDER — APIXABAN 5 MG PO TABS
5.0000 mg | ORAL_TABLET | Freq: Two times a day (BID) | ORAL | Status: DC
  Administered 2018-06-08 – 2018-06-09 (×3): 5 mg via ORAL
  Filled 2018-06-08 (×3): qty 1

## 2018-06-08 MED ORDER — LEVOTHYROXINE SODIUM 25 MCG PO TABS
25.0000 ug | ORAL_TABLET | Freq: Every day | ORAL | Status: DC
  Administered 2018-06-08 – 2018-06-09 (×2): 25 ug via ORAL
  Filled 2018-06-08 (×2): qty 1

## 2018-06-08 MED ORDER — POLYETHYLENE GLYCOL 3350 17 G PO PACK
17.0000 g | PACK | Freq: Every day | ORAL | Status: DC
  Administered 2018-06-08 – 2018-06-09 (×2): 17 g via ORAL
  Filled 2018-06-08 (×3): qty 1

## 2018-06-08 MED ORDER — MULTIVITAMIN GUMMIES ADULT PO CHEW: 2.0000 | CHEWABLE_TABLET | Freq: Every day | ORAL | Status: DC

## 2018-06-08 MED ORDER — ATORVASTATIN CALCIUM 40 MG PO TABS
40.0000 mg | ORAL_TABLET | Freq: Every day | ORAL | Status: DC
  Administered 2018-06-08: 40 mg via ORAL
  Filled 2018-06-08: qty 1

## 2018-06-08 MED ORDER — ACETAMINOPHEN 500 MG PO TABS
500.0000 mg | ORAL_TABLET | Freq: Every evening | ORAL | Status: DC | PRN
  Filled 2018-06-08: qty 1

## 2018-06-08 MED ORDER — LOSARTAN POTASSIUM-HCTZ 100-25 MG PO TABS: 1.0000 | ORAL_TABLET | Freq: Every day | ORAL | Status: DC

## 2018-06-08 MED ORDER — HYDROCHLOROTHIAZIDE 25 MG PO TABS
25.0000 mg | ORAL_TABLET | Freq: Every day | ORAL | Status: DC
  Administered 2018-06-08: 25 mg via ORAL
  Filled 2018-06-08: qty 1

## 2018-06-08 MED ORDER — DIPHENHYDRAMINE-APAP (SLEEP) 25-500 MG PO TABS: 1.0000 | ORAL_TABLET | Freq: Every evening | ORAL | Status: DC | PRN

## 2018-06-08 MED ORDER — ACETAMINOPHEN 160 MG/5ML PO SOLN: 650.0000 mg | ORAL | Status: DC | PRN

## 2018-06-08 MED ORDER — PANTOPRAZOLE SODIUM 40 MG PO TBEC
40.0000 mg | DELAYED_RELEASE_TABLET | Freq: Every day | ORAL | Status: DC
  Administered 2018-06-08 – 2018-06-09 (×2): 40 mg via ORAL
  Filled 2018-06-08 (×2): qty 1

## 2018-06-08 MED ORDER — ENOXAPARIN SODIUM 40 MG/0.4ML ~~LOC~~ SOLN: 40.0000 mg | SUBCUTANEOUS | Status: DC

## 2018-06-08 MED ORDER — DOXAZOSIN MESYLATE 2 MG PO TABS
2.0000 mg | ORAL_TABLET | Freq: Every evening | ORAL | Status: DC
  Administered 2018-06-08: 2 mg via ORAL
  Filled 2018-06-08 (×2): qty 1

## 2018-06-08 MED ORDER — STROKE: EARLY STAGES OF RECOVERY BOOK
Freq: Once | Status: AC
  Administered 2018-06-08: 1

## 2018-06-08 MED ORDER — ASPIRIN 325 MG PO TABS
325.0000 mg | ORAL_TABLET | Freq: Every day | ORAL | Status: DC
  Administered 2018-06-08 – 2018-06-09 (×2): 325 mg via ORAL
  Filled 2018-06-08 (×2): qty 1

## 2018-06-08 MED ORDER — ACETAMINOPHEN 325 MG PO TABS
650.0000 mg | ORAL_TABLET | ORAL | Status: DC | PRN
  Administered 2018-06-08: 650 mg via ORAL

## 2018-06-08 MED ORDER — DIPHENHYDRAMINE HCL 25 MG PO CAPS: 25.0000 mg | ORAL_CAPSULE | Freq: Every evening | ORAL | Status: DC | PRN

## 2018-06-08 MED ORDER — CARVEDILOL 12.5 MG PO TABS
12.5000 mg | ORAL_TABLET | Freq: Two times a day (BID) | ORAL | Status: DC
  Administered 2018-06-08 – 2018-06-09 (×2): 12.5 mg via ORAL
  Filled 2018-06-08 (×3): qty 1

## 2018-06-08 MED ORDER — POTASSIUM CHLORIDE CRYS ER 20 MEQ PO TBCR
40.0000 meq | EXTENDED_RELEASE_TABLET | Freq: Once | ORAL | Status: AC
  Administered 2018-06-08: 40 meq via ORAL
  Filled 2018-06-08: qty 2

## 2018-06-08 NOTE — TOC Initial Note (Signed)
Transition of Care [facility]) - Initial/Assessment Note    Patient Details  Name: Haley Wilcox MRN: [id number] Date of Birth: 1931-09-24  Transition of [facility]) CM/SW Contact:    Pollie Friar, RN Phone Number: 06/08/2018, 11:11 AM  Clinical Narrative:                   Expected Discharge Plan: Home/Self Care Barriers to Discharge: Continued Medical Work up   Patient Goals and CMS Choice Patient states their goals for this hospitalization and ongoing recovery are:: get back to normal      Expected Discharge Plan and Services Expected Discharge Plan: Home/Self Care Discharge Planning Services: CM Consult   Living arrangements for the past 2 months: Single Family Home(2 level home with a ramp) Expected Discharge Date: (unknown)                        Prior Living Arrangements/Services Living arrangements for the past 2 months: Single Family Home(2 level home with a ramp) Lives with:: Self Patient language and need for interpreter reviewed:: Yes(no needs) Do you feel safe going back to the place where you live?: Yes      Need for Family Participation in Patient Care: Yes (Comment) Care giver support system in place?: Yes (comment)(daughter able to provide 24 hour assist as needed) Current home services: DME(handicap shower) Criminal Activity/Legal Involvement Pertinent to Current Situation/Hospitalization: No - Comment as needed  Activities of Daily Living Home Assistive Devices/Equipment: Eyeglasses(reading glasses) ADL Screening (condition at time of admission) Patient's cognitive ability adequate to safely complete daily activities?: Yes Is the patient deaf or have difficulty hearing?: Yes Does the patient have difficulty seeing, even when wearing glasses/contacts?: No Does the patient have difficulty concentrating, remembering, or making decisions?: Yes Patient able to express need for assistance with ADLs?: Yes Does the patient have difficulty dressing or  bathing?: No Independently performs ADLs?: Yes (appropriate for developmental age) Does the patient have difficulty walking or climbing stairs?: No Weakness of Legs: None Weakness of Arms/Hands: None  Permission Sought/Granted                  Emotional Assessment Appearance:: Appears stated age Attitude/Demeanor/Rapport: Gracious Affect (typically observed): Accepting, Pleasant, Appropriate Orientation: : Oriented to Self, Oriented to Place, Oriented to  Time, Oriented to Situation   Psych Involvement: No (comment)  Admission diagnosis:  Aphasia [R47.01] TIA (transient ischemic attack) [G45.9] Patient Active Problem List   Diagnosis Date Noted  . TIA (transient ischemic attack) 06/07/2018  . A-fib ([place]) 06/07/2018  . Other fatigue 03/30/2017  . Type 2 diabetes mellitus without complication, without long-term current use of insulin ([place]) 03/30/2017  . Vitamin D deficiency 03/30/2017  . Idiopathic gout of left knee 06/06/2012  . Iron deficiency anemia 06/03/2012  . Sinus tachycardia 06/02/2012  . Hyperlipidemia 10/23/2010  . Diabetes mellitus type II, controlled ([place]) 11/23/2006  . Essential hypertension 10/13/2006   PCP:  Seward Carol, MD Pharmacy:   [place], [place] [address] Phone: [phone] Fax: [phone]     Social Determinants of Health (SDOH) Interventions  Pt driving prior to admission. Daughter able to assist after d/c if unable to drive.  Readmission Risk Interventions  No flowsheet data found.

## 2018-06-08 NOTE — Consult Note (Addendum)
STROKE TEAM PROGRESS NOTE   HISTORY PRESENT ILLNESS Her daughter is at the bedside.  She is working with therapy, inmproved from yesterday with speech and movement. I have personally reviewed history of presenting illness with the patient and her daughter.  She is doing much better and speech and language appear to have improved.  She has no known prior history of atrial fibrillation though review of her chart does document A. fib on the old EKG but she was not aware of this.  REVIEW OF SYSTEMS R sided weakness. Speech difficulty.   Past Medical History:  Diagnosis Date  . Constipation   . Diabetes mellitus   . Gout   . Hyperlipidemia   . Hypertension    Past Surgical History:  Procedure Laterality Date  . ABDOMINAL HYSTERECTOMY    . d &c    . DILATION AND CURETTAGE OF UTERUS    . EYE SURGERY     bilateral cataract  . TUBAL LIGATION     .   Vitals:   06/08/18 0300 06/08/18 0400 06/08/18 0500 06/08/18 0745  BP: (!) 128/111 (!) 197/88 (!) 174/98 (!) 179/73  Pulse: 77 77 66 73  Resp: 14 17 17 18   Temp:  98.8 F (37.1 C)  97.8 F (36.6 C)  TempSrc:  Oral  Oral  SpO2: 99% 99% 98% 98%  Weight:      Height:        CBC:  Recent Labs  Lab 06/07/18 1622 06/08/18 0509  WBC 7.4 8.3  NEUTROABS 4.6  --   HGB 12.6 12.9  HCT 39.9 37.8  MCV 89.5 84.6  PLT 231 219    Basic Metabolic Panel:  Recent Labs  Lab 06/07/18 1622 06/07/18 1630 06/08/18 0509  NA 139  --  139  K 3.5  --  3.0*  CL 101  --  105  CO2 29  --  28  GLUCOSE 167*  --  132*  BUN 19  --  10  CREATININE 0.74 0.80 0.65  CALCIUM 9.6  --  9.6   Lipid Panel:     Component Value Date/Time   CHOL 241 (H) 06/08/2018 0509   CHOL 245 (H) 03/30/2017 1052   TRIG 167 (H) 06/08/2018 0509   HDL 53 06/08/2018 0509   HDL 61 03/30/2017 1052   CHOLHDL 4.5 06/08/2018 0509   VLDL 33 06/08/2018 0509   LDLCALC 155 (H) 06/08/2018 0509   LDLCALC 154 (H) 03/30/2017 1052   HgbA1c:  Lab Results  Component Value  Date   HGBA1C 6.7 (H) 06/08/2018   Urine Drug Screen:     Component Value Date/Time   LABOPIA NONE DETECTED 06/07/2018 1720   COCAINSCRNUR NONE DETECTED 06/07/2018 1720   LABBENZ NONE DETECTED 06/07/2018 1720   AMPHETMU NONE DETECTED 06/07/2018 1720   THCU NONE DETECTED 06/07/2018 1720   LABBARB NONE DETECTED 06/07/2018 1720    Alcohol Level     Component Value Date/Time   ETH <10 06/07/2018 1622    IMAGING Ct Head Wo Contrast  Result Date: 06/07/2018 CLINICAL DATA:  Altered speech, dizzy EXAM: CT HEAD WITHOUT CONTRAST TECHNIQUE: Contiguous axial images were obtained from the base of the skull through the vertex without intravenous contrast. COMPARISON:  CT 08/16/2014 FINDINGS: Brain: No evidence of acute infarction, hemorrhage, hydrocephalus, extra-axial collection or mass lesion/mass effect. Vascular: No hyperdense vessels.  Carotid vascular calcification Skull: Normal. Negative for fracture or focal lesion. Sinuses/Orbits: Mucosal thickening in the ethmoid sinuses. Mucous retention cyst left  maxillary sinus Other: None IMPRESSION: Negative non contrasted CT appearance of the brain Electronically Signed   By: Donavan Foil M.D.   On: 06/07/2018 17:01   Mr Jodene Nam Head Wo Contrast  Result Date: 06/07/2018 CLINICAL DATA:  Initial evaluation for acute dizziness, word-finding difficulty. EXAM: MRI HEAD WITHOUT CONTRAST MRA HEAD WITHOUT CONTRAST MRA NECK WITHOUT AND WITH CONTRAST TECHNIQUE: Multiplanar, multiecho pulse sequences of the brain and surrounding structures were obtained without intravenous contrast. Angiographic images of the Circle of Willis were obtained using MRA technique without intravenous contrast. Angiographic images of the neck were obtained using MRA technique without and with intravenous contrast. Carotid stenosis measurements (when applicable) are obtained utilizing NASCET criteria, using the distal internal carotid diameter as the denominator. CONTRAST:  7 cc of Gadavist.  COMPARISON:  Prior CT from earlier the same day. FINDINGS: MRI HEAD FINDINGS Cerebral volume within normal limits. Mild chronic micro vessel ischemic disease present within the periventricular deep white matter both cerebral hemispheres. Approximate 2 cm focus of patchy diffusion abnormality present within the parasagittal left frontal lobe, compatible with acute left ACA territory infarct (series 4, image 36). No associated mass effect or hemorrhage. No other evidence for acute or subacute ischemia. Gray-white matter differentiation otherwise maintained. No acute intracranial hemorrhage. Few scattered chronic micro hemorrhages noted within the brain, likely related to small vessel disease and/or hypertension. No mass lesion, midline shift or mass effect. No hydrocephalus. No extra-axial fluid collection. Pituitary gland suprasellar region normal. Midline structures intact. Major intracranial vascular flow voids maintained. Craniocervical junction normal. Bone marrow signal intensity within normal limits. Hyperostosis frontalis interna noted. Scalp soft tissues unremarkable. Patient status post bilateral ocular lens replacement. Scattered mucosal thickening throughout the paranasal sinuses with superimposed left maxillary sinus retention cyst. Paranasal sinuses are otherwise clear. No mastoid effusion. Inner ear structures normal. MRA HEAD FINDINGS ANTERIOR CIRCULATION: Distal cervical segments of the internal carotid arteries are widely patent with antegrade flow. Petrous segments widely patent. Atheromatous change within the cavernous/supraclinoid ICAs bilaterally, right worse than left. Resultant moderate to severe stenosis at the supraclinoid right ICA (series 13, image 133). Focal moderate to severe stenosis at the anterior genu of the cavernous left ICA (series 13, image 133). ICA termini widely patent. Left A1 patent. Right A1 hypoplastic and/or absent. Normal anterior communicating artery. Anterior cerebral  arteries patent proximally. Left ACA attenuated distally, and may be partially occluded (series 13, image 217). M1 segments widely patent. Normal MCA bifurcations. Distal MCA branches well perfused and symmetric. POSTERIOR CIRCULATION: Vertebral arteries patent to the vertebrobasilar junction without stenosis. Right vertebral artery dominant. Right PICA patent. Left PICA not seen. Basilar widely patent to its distal aspect. Superior cerebral arteries patent bilaterally. Left PCA widely patent to its distal aspect. Focal moderate proximal right P2 stenosis. Right PCA otherwise widely patent. Small bilateral posterior communicating arteries noted. No intracranial aneurysm. MRA NECK FINDINGS Source images reviewed. Patent antegrade flow seen within the carotid and vertebral arteries bilaterally on time-of-flight images. Visualized aortic arch of normal caliber. Probable aberrant right subclavian artery. No flow-limiting stenosis about the origin of the great vessels. Visualized subclavian arteries widely patent. Right common carotid artery arises directly from the aortic arch. Right CCA patent to the bifurcation without high-grade stenosis. Atheromatous stenosis at the origin of the right ICA measuring up to approximately 70% by NASCET criteria. Right ICA otherwise widely patent distally to the skull base. Left common carotid artery widely patent from its origin to the bifurcation. Mild atheromatous narrowing about the  left bifurcation/proximal left ICA with relatively mild no more than 20-25% narrowing. Left ICA widely patent distally to the skull base without stenosis or occlusion. Both of the vertebral arteries arise from the subclavian arteries. Right vertebral artery dominant. Vertebral arteries patent within the neck without flow-limiting stenosis or occlusion. IMPRESSION: MRI HEAD IMPRESSION: 1. Approximate 2 cm patchy acute ischemic nonhemorrhagic left ACA territory infarct involving the parasagittal left  frontal lobe. 2. Underlying age-related cerebral atrophy with mild chronic microvascular ischemic disease. MRA HEAD IMPRESSION: 1. Attenuation of the distal left ACA with loss of flow related signal, likely occluded given the acute ACA infarct. 2. Atheromatous plaque involving the carotid siphons bilaterally with resultant moderate to severe multifocal stenoses as above. 3. Focal moderate proximal right P2 stenosis. 4. Dominant left A1 segment, with the anterior cerebral arteries primarily supplied via the left carotid artery system. MRA NECK IMPRESSION: 1. Approximate 70% atheromatous stenosis at the origin of the right ICA. 2. Relatively mild no more than 20-25% narrowing at the origin of the left ICA. No other flow-limiting stenosis within the left carotid artery system. 3. Widely patent vertebral arteries within the neck. Right vertebral artery is dominant. 4. Aberrant right subclavian artery. Electronically Signed   By: Jeannine Boga M.D.   On: 06/07/2018 21:59   Mr Angiogram Neck W Or Wo Contrast  Result Date: 06/07/2018 CLINICAL DATA:  Initial evaluation for acute dizziness, word-finding difficulty. EXAM: MRI HEAD WITHOUT CONTRAST MRA HEAD WITHOUT CONTRAST MRA NECK WITHOUT AND WITH CONTRAST TECHNIQUE: Multiplanar, multiecho pulse sequences of the brain and surrounding structures were obtained without intravenous contrast. Angiographic images of the Circle of Willis were obtained using MRA technique without intravenous contrast. Angiographic images of the neck were obtained using MRA technique without and with intravenous contrast. Carotid stenosis measurements (when applicable) are obtained utilizing NASCET criteria, using the distal internal carotid diameter as the denominator. CONTRAST:  7 cc of Gadavist. COMPARISON:  Prior CT from earlier the same day. FINDINGS: MRI HEAD FINDINGS Cerebral volume within normal limits. Mild chronic micro vessel ischemic disease present within the periventricular  deep white matter both cerebral hemispheres. Approximate 2 cm focus of patchy diffusion abnormality present within the parasagittal left frontal lobe, compatible with acute left ACA territory infarct (series 4, image 36). No associated mass effect or hemorrhage. No other evidence for acute or subacute ischemia. Gray-white matter differentiation otherwise maintained. No acute intracranial hemorrhage. Few scattered chronic micro hemorrhages noted within the brain, likely related to small vessel disease and/or hypertension. No mass lesion, midline shift or mass effect. No hydrocephalus. No extra-axial fluid collection. Pituitary gland suprasellar region normal. Midline structures intact. Major intracranial vascular flow voids maintained. Craniocervical junction normal. Bone marrow signal intensity within normal limits. Hyperostosis frontalis interna noted. Scalp soft tissues unremarkable. Patient status post bilateral ocular lens replacement. Scattered mucosal thickening throughout the paranasal sinuses with superimposed left maxillary sinus retention cyst. Paranasal sinuses are otherwise clear. No mastoid effusion. Inner ear structures normal. MRA HEAD FINDINGS ANTERIOR CIRCULATION: Distal cervical segments of the internal carotid arteries are widely patent with antegrade flow. Petrous segments widely patent. Atheromatous change within the cavernous/supraclinoid ICAs bilaterally, right worse than left. Resultant moderate to severe stenosis at the supraclinoid right ICA (series 13, image 133). Focal moderate to severe stenosis at the anterior genu of the cavernous left ICA (series 13, image 133). ICA termini widely patent. Left A1 patent. Right A1 hypoplastic and/or absent. Normal anterior communicating artery. Anterior cerebral arteries patent proximally. Left ACA attenuated  distally, and may be partially occluded (series 13, image 217). M1 segments widely patent. Normal MCA bifurcations. Distal MCA branches well  perfused and symmetric. POSTERIOR CIRCULATION: Vertebral arteries patent to the vertebrobasilar junction without stenosis. Right vertebral artery dominant. Right PICA patent. Left PICA not seen. Basilar widely patent to its distal aspect. Superior cerebral arteries patent bilaterally. Left PCA widely patent to its distal aspect. Focal moderate proximal right P2 stenosis. Right PCA otherwise widely patent. Small bilateral posterior communicating arteries noted. No intracranial aneurysm. MRA NECK FINDINGS Source images reviewed. Patent antegrade flow seen within the carotid and vertebral arteries bilaterally on time-of-flight images. Visualized aortic arch of normal caliber. Probable aberrant right subclavian artery. No flow-limiting stenosis about the origin of the great vessels. Visualized subclavian arteries widely patent. Right common carotid artery arises directly from the aortic arch. Right CCA patent to the bifurcation without high-grade stenosis. Atheromatous stenosis at the origin of the right ICA measuring up to approximately 70% by NASCET criteria. Right ICA otherwise widely patent distally to the skull base. Left common carotid artery widely patent from its origin to the bifurcation. Mild atheromatous narrowing about the left bifurcation/proximal left ICA with relatively mild no more than 20-25% narrowing. Left ICA widely patent distally to the skull base without stenosis or occlusion. Both of the vertebral arteries arise from the subclavian arteries. Right vertebral artery dominant. Vertebral arteries patent within the neck without flow-limiting stenosis or occlusion. IMPRESSION: MRI HEAD IMPRESSION: 1. Approximate 2 cm patchy acute ischemic nonhemorrhagic left ACA territory infarct involving the parasagittal left frontal lobe. 2. Underlying age-related cerebral atrophy with mild chronic microvascular ischemic disease. MRA HEAD IMPRESSION: 1. Attenuation of the distal left ACA with loss of flow related  signal, likely occluded given the acute ACA infarct. 2. Atheromatous plaque involving the carotid siphons bilaterally with resultant moderate to severe multifocal stenoses as above. 3. Focal moderate proximal right P2 stenosis. 4. Dominant left A1 segment, with the anterior cerebral arteries primarily supplied via the left carotid artery system. MRA NECK IMPRESSION: 1. Approximate 70% atheromatous stenosis at the origin of the right ICA. 2. Relatively mild no more than 20-25% narrowing at the origin of the left ICA. No other flow-limiting stenosis within the left carotid artery system. 3. Widely patent vertebral arteries within the neck. Right vertebral artery is dominant. 4. Aberrant right subclavian artery. Electronically Signed   By: Jeannine Boga M.D.   On: 06/07/2018 21:59   Mr Brain Wo Contrast  Result Date: 06/07/2018 CLINICAL DATA:  Initial evaluation for acute dizziness, word-finding difficulty. EXAM: MRI HEAD WITHOUT CONTRAST MRA HEAD WITHOUT CONTRAST MRA NECK WITHOUT AND WITH CONTRAST TECHNIQUE: Multiplanar, multiecho pulse sequences of the brain and surrounding structures were obtained without intravenous contrast. Angiographic images of the Circle of Willis were obtained using MRA technique without intravenous contrast. Angiographic images of the neck were obtained using MRA technique without and with intravenous contrast. Carotid stenosis measurements (when applicable) are obtained utilizing NASCET criteria, using the distal internal carotid diameter as the denominator. CONTRAST:  7 cc of Gadavist. COMPARISON:  Prior CT from earlier the same day. FINDINGS: MRI HEAD FINDINGS Cerebral volume within normal limits. Mild chronic micro vessel ischemic disease present within the periventricular deep white matter both cerebral hemispheres. Approximate 2 cm focus of patchy diffusion abnormality present within the parasagittal left frontal lobe, compatible with acute left ACA territory infarct (series  4, image 36). No associated mass effect or hemorrhage. No other evidence for acute or subacute ischemia. Gray-white matter  differentiation otherwise maintained. No acute intracranial hemorrhage. Few scattered chronic micro hemorrhages noted within the brain, likely related to small vessel disease and/or hypertension. No mass lesion, midline shift or mass effect. No hydrocephalus. No extra-axial fluid collection. Pituitary gland suprasellar region normal. Midline structures intact. Major intracranial vascular flow voids maintained. Craniocervical junction normal. Bone marrow signal intensity within normal limits. Hyperostosis frontalis interna noted. Scalp soft tissues unremarkable. Patient status post bilateral ocular lens replacement. Scattered mucosal thickening throughout the paranasal sinuses with superimposed left maxillary sinus retention cyst. Paranasal sinuses are otherwise clear. No mastoid effusion. Inner ear structures normal. MRA HEAD FINDINGS ANTERIOR CIRCULATION: Distal cervical segments of the internal carotid arteries are widely patent with antegrade flow. Petrous segments widely patent. Atheromatous change within the cavernous/supraclinoid ICAs bilaterally, right worse than left. Resultant moderate to severe stenosis at the supraclinoid right ICA (series 13, image 133). Focal moderate to severe stenosis at the anterior genu of the cavernous left ICA (series 13, image 133). ICA termini widely patent. Left A1 patent. Right A1 hypoplastic and/or absent. Normal anterior communicating artery. Anterior cerebral arteries patent proximally. Left ACA attenuated distally, and may be partially occluded (series 13, image 217). M1 segments widely patent. Normal MCA bifurcations. Distal MCA branches well perfused and symmetric. POSTERIOR CIRCULATION: Vertebral arteries patent to the vertebrobasilar junction without stenosis. Right vertebral artery dominant. Right PICA patent. Left PICA not seen. Basilar widely  patent to its distal aspect. Superior cerebral arteries patent bilaterally. Left PCA widely patent to its distal aspect. Focal moderate proximal right P2 stenosis. Right PCA otherwise widely patent. Small bilateral posterior communicating arteries noted. No intracranial aneurysm. MRA NECK FINDINGS Source images reviewed. Patent antegrade flow seen within the carotid and vertebral arteries bilaterally on time-of-flight images. Visualized aortic arch of normal caliber. Probable aberrant right subclavian artery. No flow-limiting stenosis about the origin of the great vessels. Visualized subclavian arteries widely patent. Right common carotid artery arises directly from the aortic arch. Right CCA patent to the bifurcation without high-grade stenosis. Atheromatous stenosis at the origin of the right ICA measuring up to approximately 70% by NASCET criteria. Right ICA otherwise widely patent distally to the skull base. Left common carotid artery widely patent from its origin to the bifurcation. Mild atheromatous narrowing about the left bifurcation/proximal left ICA with relatively mild no more than 20-25% narrowing. Left ICA widely patent distally to the skull base without stenosis or occlusion. Both of the vertebral arteries arise from the subclavian arteries. Right vertebral artery dominant. Vertebral arteries patent within the neck without flow-limiting stenosis or occlusion. IMPRESSION: MRI HEAD IMPRESSION: 1. Approximate 2 cm patchy acute ischemic nonhemorrhagic left ACA territory infarct involving the parasagittal left frontal lobe. 2. Underlying age-related cerebral atrophy with mild chronic microvascular ischemic disease. MRA HEAD IMPRESSION: 1. Attenuation of the distal left ACA with loss of flow related signal, likely occluded given the acute ACA infarct. 2. Atheromatous plaque involving the carotid siphons bilaterally with resultant moderate to severe multifocal stenoses as above. 3. Focal moderate proximal  right P2 stenosis. 4. Dominant left A1 segment, with the anterior cerebral arteries primarily supplied via the left carotid artery system. MRA NECK IMPRESSION: 1. Approximate 70% atheromatous stenosis at the origin of the right ICA. 2. Relatively mild no more than 20-25% narrowing at the origin of the left ICA. No other flow-limiting stenosis within the left carotid artery system. 3. Widely patent vertebral arteries within the neck. Right vertebral artery is dominant. 4. Aberrant right subclavian artery. Electronically Signed  By: Jeannine Boga M.D.   On: 06/07/2018 21:59   Ct Head Code Stroke Wo Contrast`  Result Date: 06/07/2018 CLINICAL DATA:  Code stroke.    Inability to speak 715 tonight EXAM: CT HEAD WITHOUT CONTRAST TECHNIQUE: Contiguous axial images were obtained from the base of the skull through the vertex without intravenous contrast. COMPARISON:  CT head 06/07/2018 FINDINGS: Brain: Ventricle size and cerebral volume normal. Mild hypodensity in the periventricular white matter appears chronic. Negative for acute infarct, hemorrhage, mass Vascular: Negative for hyperdense vessel. Atherosclerotic calcification. Skull: Negative Sinuses/Orbits: Mild mucosal edema paranasal sinuses. Bilateral cataract surgery Other: None ASPECTS ([place] Stroke Program Early CT Score) - Ganglionic level infarction (caudate, lentiform nuclei, internal capsule, insula, M1-M3 cortex): 7 - Supraganglionic infarction (M4-M6 cortex): 3 Total score (0-10 with 10 being normal): 10 IMPRESSION: 1. No acute intracranial abnormality. Mild chronic white matter disease 2. ASPECTS is 10 3. These results were called by telephone at the time of interpretation on 06/07/2018 at 7:52 pm to Dr. Dorie Rank , who verbally acknowledged these results. Electronically Signed   By: Franchot Gallo M.D.   On: 06/07/2018 19:53   Vas US Carotid (at [place] Only)  Result Date: 06/08/2018 Carotid Arterial Duplex Study Indications: TIA.  Performing Technologist: Oliver Hum RVT  Examination Guidelines: A complete evaluation includes B-mode imaging, spectral Doppler, color Doppler, and power Doppler as needed of all accessible portions of each vessel. Bilateral testing is considered an integral part of a complete examination. Limited examinations for reoccurring indications may be performed as noted.  Right Carotid Findings: +----------+--------+-------+--------+--------------------------------+--------+           PSV cm/sEDV    StenosisDescribe                        Comments                   cm/s                                                    +----------+--------+-------+--------+--------------------------------+--------+ CCA Prox  64      10             smooth and heterogenous                  +----------+--------+-------+--------+--------------------------------+--------+ CCA Distal49      11             smooth and heterogenous                  +----------+--------+-------+--------+--------------------------------+--------+ ICA Prox  138     53     40-59%  smooth, heterogenous and                                                  calcific                                 +----------+--------+-------+--------+--------------------------------+--------+ ICA Distal47      16  tortuous +----------+--------+-------+--------+--------------------------------+--------+ ECA       58      6                                                       +----------+--------+-------+--------+--------------------------------+--------+ +----------+--------+-------+--------+-------------------+           PSV cm/sEDV cmsDescribeArm Pressure (mmHG) +----------+--------+-------+--------+-------------------+ [id number]                                         +----------+--------+-------+--------+-------------------+  +---------+--------+--+--------+--+---------+ VertebralPSV cm/s72EDV cm/s23Antegrade +---------+--------+--+--------+--+---------+  Left Carotid Findings: +----------+--------+--------+--------+-----------------------+--------+           PSV cm/sEDV cm/sStenosisDescribe               Comments +----------+--------+--------+--------+-----------------------+--------+ CCA Prox  72      13              smooth and heterogenous         +----------+--------+--------+--------+-----------------------+--------+ CCA Distal54      12              smooth and heterogenous         +----------+--------+--------+--------+-----------------------+--------+ ICA Prox  72      21              smooth and heterogenoustortuous +----------+--------+--------+--------+-----------------------+--------+ ICA Distal47      17                                     tortuous +----------+--------+--------+--------+-----------------------+--------+ ECA       64      7                                               +----------+--------+--------+--------+-----------------------+--------+ +----------+--------+--------+--------+-------------------+ SubclavianPSV cm/sEDV cm/sDescribeArm Pressure (mmHG) +----------+--------+--------+--------+-------------------+           118                                         +----------+--------+--------+--------+-------------------+ +---------+--------+--+--------+-+---------+ VertebralPSV cm/s34EDV cm/s8Antegrade +---------+--------+--+--------+-+---------+  Summary: Right Carotid: Velocities in the right ICA are consistent with a 40-59%                stenosis. Left Carotid: Velocities in the left ICA are consistent with a 1-39% stenosis. Vertebrals: Bilateral vertebral arteries demonstrate antegrade flow. *See table(s) above for measurements and observations.     Preliminary    2D Echocardiogram   1. The left ventricle has normal systolic function  with an ejection fraction of 60-65%. The cavity size was normal. There is mildly increased left ventricular wall thickness. Left ventricular diastolic function could not be evaluated secondary to atrial  fibrillation.  2. The right ventricle has normal systolic function. The cavity was normal. There is no increase in right ventricular wall thickness.  3. Left atrial size was severely dilated.  4. Right atrial size was mildly dilated.  5. The aortic root and ascending aorta are normal in size and structure.  6.  The interatrial septum was not assessed.   PHYSICAL EXAM Pleasant frail elderly Caucasian lady not in distress.  She is slightly hard of hearing. . Afebrile. Head is nontraumatic. Neck is supple without bruit.    Cardiac exam no murmur or gallop. Lungs are clear to auscultation. Distal pulses are well felt.  Neurological Exam ;  Awake  Alert oriented x 3. Normal speech and language except for slight word hesitancy and nonfluent C.  No paraphasic errors.  Able to name and repeat and comprehend quite well..eye movements full without nystagmus.fundi were not visualized. Vision acuity and fields appear normal. Hearing is normal. Palatal movements are normal. Face asymmetric with slight right lower facial weakness when she smiles only.. Tongue midline. Normal strength, tone, reflexes and coordination except slightly diminished fine finger movements on the right.  Mild right grip weakness.  Orbits left over right upper extremity.. Normal sensation. Gait deferred.   ASSESSMENT/PLAN Ms. Haley Wilcox is a 85 y.o. female with history of DB, HTN, HLD, Fe deficiency anemia presenting with sudden onset aphasia and dizziness, also with mild R hand fine motor deficits.   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 Small vessel disease. ASPECTS 10.    MRI  62mm patchy L ACA parasagittal L frontal lobe infarct. Small  vessel disease. Atrophy.   MRA head  L ACA loss of flow. B ICA siphon plaque. Focal R P2 stenosis.   MRA neck R ICA 70% at origin. L ICA 20-25%.  2D Echo  EF 60-65%. No source of embolus   LDL 155  HgbA1c 6.7  IV heparin for VTE prophylaxis  No antithrombotic prior to admission, now on heparin IV. Recommend change to DOAC  Therapy recommendations:  HH PT, OT, SLP  Disposition:  pending  Follow-up Stroke Clinic at [facility] in 4 weeks. Office will call with appointment date and time. Order placed.  Atrial Fibrillation, new dx  Home anticoagulation:  none   Now on IV heparin  CHA2DS2-VASc Score = 8, ?2 oral anticoagulation recommended  Age in Years:  ?10   +2    Sex:  Female   Female   +1    Hypertension History: yes   +1     Diabetes Mellitus:  yes   +1  Congestive Heart Failure History:  0  Vascular Disease History:  yes   +1     Stroke/TIA/Thromboembolism History:  yes   +2 . Recommend change to eliquis and Continue at discharge  Hypertension  Stable . Permissive hypertension (OK if < 220/120) but gradually normalize in 5-7 days . Long-term BP goal normotensive  Hyperlipidemia  Home meds:  No statin  LDL 155, goal < 70  Now on lipitor 40  Continue statin at discharge  Diabetes type II  HgbA1c 6.7, at goal < 7.0  Controlled  Other Stroke Risk Factors  Advanced age  Obesity, Body mass index is 32.78 kg/m., recommend weight loss, diet and exercise as appropriate   Other Active Problems  hypokalemia K 3.0   hypothyroidism   Hospital day # 0  Burnetta Sabin, MSN, APRN, ANVP-BC, AGPCNP-BC Advanced Practice Stroke Nurse [place] for Schedule & Pager information 06/08/2018 10:45 AM  I have personally obtained history,examined this patient, reviewed notes, independently viewed imaging studies, participated in medical decision making and plan of care.ROS completed by me personally and pertinent  positives  fully documented  I have made any additions or clarifications directly to the above note. Agree with note above.  She has presented with speech difficulty secondary to small left ACA embolic infarct from new onset atrial fibrillation.  She remains at risk for recurrent strokes and will benefit with long-term anticoagulation with Eliquis.  Continue ongoing stroke work-up and aggressive risk factor modification.  Long discussion with patient and daughter at the bedside and answered questions.  Discussed with Dr. [place] Pink.  Greater than 50% time during this 35-minute visit were spent on counseling and coordination of care about her embolic stroke, atrial fibrillation and discussion about stroke prevention and treatment and answering questions.  Antony Contras, MD Medical Director [facility] Pager: [phone] 06/08/2018 4:14 PM  To contact Stroke Continuity provider, please refer to [email or web address] After hours, contact General Neurology

## 2018-06-08 NOTE — Evaluation (Signed)
Occupational Therapy Evaluation Patient Details Name: Haley Wilcox MRN: [id number] DOB: 05-29-1931 Today's Date: 06/08/2018    History of Present Illness Pt is a 85 y/o female with PMH of diabetes, HTN, iron deficiency anemia, L eye decreased vision since childhood, presented to ER with sudden onset of aphasia. CT negative. MRI shows Approximate 2 cm patchy acute ischemic nonhemorrhagic left ACA territory infarct involving the parasagittal left frontal lobe.    Clinical Impression   PTA patient independent and driving.  Admitted with above and limited by R UE weakness and impaired coordination, R inattention, decreased activity tolerance, and decreased problem solving.  Patient able to complete UB ADLs with supervision, LB ADLs with min guard assist, grooming with supervision standing at sink, basic transfers and in room mobility with min guard fading to supervision.  Requires cueing throughout session to attend to R side and utilize dominant R UE during activities.  She has increased difficulty following multistep commands, and requires increased time to process. Further visual and functional cognition assessment recommended with pill box test.  Daughter reports they will provided 24/7 support as long as she needs it after dc.  Based on performance today, recommend continued OT services while admitted and after dc at [facility] level in order to optimize independence and safety with ADLs.  Will follow.     Follow Up Recommendations  Home health OT;Supervision/Assistance - 24 hour    Equipment Recommendations  3 in 1 bedside commode    Recommendations for Other Services PT consult(as ordered)     Precautions / Restrictions Precautions Precautions: Fall Restrictions Weight Bearing Restrictions: No      Mobility Bed Mobility Overal bed mobility: Needs Assistance Bed Mobility: Supine to Sit     Supine to sit: Supervision     General bed mobility comments: supervision for safety, no  assist required  Transfers Overall transfer level: Needs assistance Equipment used: None Transfers: Sit to/from Stand Sit to Stand: Min guard;Supervision         General transfer comment: min guard for safety and balance, fading to supervision     Balance Overall balance assessment: Needs assistance Sitting-balance support: No upper extremity supported;Feet supported Sitting balance-Leahy Scale: Good     Standing balance support: No upper extremity supported;During functional activity Standing balance-Leahy Scale: Fair Standing balance comment: supervision at sink for grooming, within base of support                           ADL either performed or assessed with clinical judgement   ADL Overall ADL's : Needs assistance/impaired     Grooming: Wash/dry hands;Wash/dry face;Oral care;Supervision/safety;Standing Grooming Details (indicate cue type and reason): cueing to locate items on R side, utilize R UE during functional tasks  Upper Body Bathing: Supervision/ safety;Standing   Lower Body Bathing: Min guard;Sit to/from stand   Upper Body Dressing : Supervision/safety;Sitting   Lower Body Dressing: Min guard;Sit to/from stand Lower Body Dressing Details (indicate cue type and reason): cueing for safety to remove underwear in sitting  Toilet Transfer: Min guard;Ambulation;Regular Toilet;Grab bars   Toileting- Clothing Manipulation and Hygiene: Min guard;Sit to/from stand       Functional mobility during ADLs: Min guard General ADL Comments: pt requires increased time for all tasks, limited R sided coordination and attention to UE/environment      Vision Baseline Vision/History: Wears glasses Wears Glasses: Reading only Patient Visual Report: No change from baseline Additional Comments: min cueing to locate  items on R side, further visual assessment needed     Perception Perception Perception Tested?: Yes Perception Deficits:  Inattention/neglect Inattention/Neglect: Does not attend to right visual field;Does not attend to right side of body   Praxis      Pertinent Vitals/Pain Pain Assessment: No/denies pain     Hand Dominance Right   Extremity/Trunk Assessment Upper Extremity Assessment Upper Extremity Assessment: Generalized weakness;RUE deficits/detail RUE Deficits / Details: able to use as gross stabilizer, decreased coordination and functional use  RUE Sensation: WNL RUE Coordination: decreased fine motor;decreased gross motor   Lower Extremity Assessment Lower Extremity Assessment: Defer to PT evaluation   Cervical / Trunk Assessment Cervical / Trunk Assessment: Normal   Communication Communication Communication: Expressive difficulties   Cognition Arousal/Alertness: Awake/alert Behavior During Therapy: WFL for tasks assessed/performed Overall Cognitive Status: Impaired/Different from baseline Area of Impairment: Attention;Memory;Following commands;Safety/judgement;Awareness;Problem solving                   Current Attention Level: Sustained Memory: Decreased short-term memory Following Commands: Follows one step commands consistently;Follows one step commands with increased time;Follows multi-step commands inconsistently Safety/Judgement: Decreased awareness of safety;Decreased awareness of deficits Awareness: Emergent Problem Solving: Slow processing;Requires verbal cues;Difficulty sequencing General Comments: pt with slow processing and poor attention to R side during functional tasks; increased cueing required for multistep tasks     General Comments  daughter present and supportive    Exercises     Shoulder Instructions      Home Living Family/patient expects to be discharged to:: Private residence Living Arrangements: Alone Available Help at Discharge: Family;Available 24 hours/day Type of Home: House Home Access: Stairs to enter [employer] of Steps:  4-5 Entrance Stairs-Rails: (+ rails ) Home Layout: One level     Bathroom Shower/Tub: Teacher, early years/pre: Handicapped height     Home Equipment: Grab bars - tub/shower;Grab bars - toilet;Walker - 2 wheels;Wheelchair - manual          Prior Functioning/Environment Level of Independence: Independent        Comments: independent ADLs, IADLs, medication mgmt, driving         OT Problem List: Decreased activity tolerance;Decreased coordination;Impaired balance (sitting and/or standing);Decreased strength;Decreased cognition;Decreased safety awareness;Decreased knowledge of use of DME or AE;Decreased knowledge of precautions;Impaired UE functional use      OT Treatment/Interventions: Self-care/ADL training;Neuromuscular education;Energy conservation;DME and/or AE instruction;Therapeutic activities;Cognitive remediation/compensation;Patient/family education;Balance training;Visual/perceptual remediation/compensation    OT Goals(Current goals can be found in the care plan section) Acute Rehab OT Goals Patient Stated Goal: to get home  OT Goal Formulation: With patient Time For Goal Achievement: 06/22/18 Potential to Achieve Goals: Good  OT Frequency: Min 2X/week   Barriers to D/C:            Co-evaluation              AM-PAC OT "6 Clicks" Daily Activity     Outcome Measure Help from another person eating meals?: A Little Help from another person taking care of personal grooming?: A Little Help from another person toileting, which includes using toliet, bedpan, or urinal?: A Little Help from another person bathing (including washing, rinsing, drying)?: A Little Help from another person to put on and taking off regular upper body clothing?: A Little Help from another person to put on and taking off regular lower body clothing?: A Little 6 Click Score: 18   End of Session Equipment Utilized During Treatment: Gait belt Nurse Communication: Mobility  status  Activity Tolerance: Patient tolerated treatment well Patient left: in chair;with call bell/phone within reach;with family/visitor present  OT Visit Diagnosis: Other symptoms and signs involving cognitive function;Other symptoms and signs involving the nervous system (R29.898)                Time: 5638-7564 OT Time Calculation (min): 35 min Charges:  OT General Charges $OT Visit: 1 Visit OT Evaluation $OT Eval Moderate Complexity: 1 Mod OT Treatments $Self Care/Home Management : 8-22 mins  Delight Stare, OT Acute Rehabilitation Services Pager [phone] Office [phone]   Delight Stare 06/08/2018, 12:18 PM

## 2018-06-08 NOTE — Progress Notes (Signed)
  Echocardiogram 2D Echocardiogram has been performed.  Haley Wilcox Haley Wilcox 06/08/2018, 2:08 PM

## 2018-06-08 NOTE — Evaluation (Signed)
Physical Therapy Evaluation Patient Details Name: Haley Wilcox MRN: [id number] DOB: 05-Jun-1931 Today's Date: 06/08/2018   History of Present Illness  Pt is a 85 y/o female with PMH of diabetes, HTN, iron deficiency anemia, L eye decreased vision since childhood, presented to ER with sudden onset of aphasia. CT negative. MRI shows Approximate 2 cm patchy acute ischemic nonhemorrhagic left ACA territory infarct involving the parasagittal left frontal lobe.   Clinical Impression  Pt is close to baseline functioning with some incoordination R UE, some mild inattention on the right and mild balance deviation with more complex challenges, but she should be safe at home with available family help. There are no further acute PT needs.  Will sign off at this time.     Follow Up Recommendations Home health PT;Supervision/Assistance - 24 hour    Equipment Recommendations  None recommended by PT    Recommendations for Other Services       Precautions / Restrictions Precautions Precautions: Fall Restrictions Weight Bearing Restrictions: No      Mobility  Bed Mobility Overal bed mobility: Needs Assistance Bed Mobility: Supine to Sit     Supine to sit: Supervision     General bed mobility comments: supervision for safety, no assist required  Transfers Overall transfer level: Needs assistance Equipment used: None Transfers: Sit to/from Stand Sit to Stand: Min guard;Supervision         General transfer comment: min guard for safety and balance, fading to supervision   Ambulation/Gait Ambulation/Gait assistance: Min guard Gait Distance (Feet): 350 Feet Assistive device: None Gait Pattern/deviations: Step-through pattern   Gait velocity interpretation: 1.31 - 2.62 ft/sec, indicative of limited community ambulator General Gait Details: generally steady with ability to scan, turn abruptly, back up and change speed, but she was still guarded and did not increase speed  significantly.  Stairs Stairs: Yes Stairs assistance: Supervision Stair Management: One rail Left;Alternating pattern;Forwards;Step to pattern(step to down) Number of Stairs: 3 General stair comments: safe with rail  Wheelchair Mobility    Modified Rankin (Stroke Patients Only) Modified Rankin (Stroke Patients Only) Pre-Morbid Rankin Score: No symptoms Modified Rankin: Moderate disability     Balance Overall balance assessment: Needs assistance Sitting-balance support: No upper extremity supported;Feet supported Sitting balance-Leahy Scale: Good     Standing balance support: No upper extremity supported;During functional activity Standing balance-Leahy Scale: Fair Standing balance comment: supervision at sink for grooming, within base of support                             Pertinent Vitals/Pain Pain Assessment: No/denies pain    Home Living Family/patient expects to be discharged to:: Private residence Living Arrangements: Alone Available Help at Discharge: Family;Available 24 hours/day Type of Home: House Home Access: Stairs to enter Entrance Stairs-Rails: (+ rails ) Entrance Stairs-Number of Steps: 4-5 Home Layout: One level Home Equipment: Grab bars - tub/shower;Grab bars - toilet;Walker - 2 wheels;Wheelchair - manual      Prior Function Level of Independence: Independent         Comments: independent ADLs, IADLs, medication mgmt, driving      Hand Dominance   Dominant Hand: Right    Extremity/Trunk Assessment   Upper Extremity Assessment RUE Deficits / Details: able to use as gross stabilizer, decreased coordination and functional use  RUE Sensation: WNL RUE Coordination: decreased fine motor;decreased gross motor    Lower Extremity Assessment Lower Extremity Assessment: Overall WFL for tasks assessed  Cervical / Trunk Assessment Cervical / Trunk Assessment: Normal  Communication   Communication: Expressive difficulties   Cognition Arousal/Alertness: Awake/alert Behavior During Therapy: WFL for tasks assessed/performed Overall Cognitive Status: Impaired/Different from baseline Area of Impairment: Attention;Memory;Following commands;Safety/judgement;Awareness;Problem solving                   Current Attention Level: Selective Memory: Decreased short-term memory Following Commands: Follows one step commands consistently;Follows one step commands with increased time;Follows multi-step commands with increased time Safety/Judgement: Decreased awareness of safety;Decreased awareness of deficits Awareness: Emergent Problem Solving: Slow processing General Comments: pt still slower with processing, but attention can be drawn to R side fairly easily this afternoon.      General Comments      Exercises     Assessment/Plan    PT Assessment All further PT needs can be met in the next venue of care  PT Problem List Decreased strength;Decreased balance;Decreased mobility(mild cognitive issues)       PT Treatment Interventions      PT Goals (Current goals can be found in the Care Plan section)  Acute Rehab PT Goals Patient Stated Goal: to get home  PT Goal Formulation: All assessment and education complete, DC therapy    Frequency     Barriers to discharge        Co-evaluation               AM-PAC PT "6 Clicks" Mobility  Outcome Measure Help needed turning from your back to your side while in a flat bed without using bedrails?: None Help needed moving from lying on your back to sitting on the side of a flat bed without using bedrails?: None Help needed moving to and from a bed to a chair (including a wheelchair)?: None Help needed standing up from a chair using your arms (e.g., wheelchair or bedside chair)?: None Help needed to walk in hospital room?: A Little Help needed climbing 3-5 steps with a railing? : None 6 Click Score: 23    End of Session   Activity Tolerance: Patient  tolerated treatment well Patient left: in bed;with call bell/phone within reach;with bed alarm set Nurse Communication: Mobility status PT Visit Diagnosis: Unsteadiness on feet (R26.81);Other symptoms and signs involving the nervous system (R29.898)    Time: 9407-6808 PT Time Calculation (min) (ACUTE ONLY): 17 min   Charges:   PT Evaluation $PT Eval Low Complexity: 1 Low          06/08/2018  Donnella Sham, PT Acute Rehabilitation Services [phone]  (pager) [phone]  (office)  Tessie Fass Chantrell Apsey 06/08/2018, 5:40 PM

## 2018-06-08 NOTE — Progress Notes (Signed)
ANTICOAGULATION CONSULT NOTE - Follow-up Consult  Pharmacy Consult for: transition from IV heparin to Apixaban Indication: New onset AFib in the setting of CVA vs TIA  Allergies  Allergen Reactions  . Fluoride Preparations Swelling    "Lips swell" when brushing teeth with toothpaste that has flouride   . Ivp Dye [Iodinated Diagnostic Agents] Rash    Patient Measurements: Height: 5\' 2"  (157.5 cm) Weight: 179 lb 3.7 oz (81.3 kg) IBW/kg (Calculated) : 50.1 Heparin Dosing Weight: 68 kg   Vital Signs: Temp: 98 F (36.7 C) (03/18 1150) Temp Source: Oral (03/18 1150) BP: 162/86 (03/18 1150) Pulse Rate: 56 (03/18 1150)  Labs: Recent Labs    06/07/18 1622 06/07/18 1630 06/08/18 0509  HGB 12.6  --  12.9  HCT 39.9  --  37.8  PLT 231  --  235  APTT 30  --   --   LABPROT 12.6  --   --   INR 1.0  --   --   HEPARINUNFRC  --   --  0.25*  CREATININE 0.74 0.80 0.65    Estimated Creatinine Clearance: 51.7 mL/min (by C-G formula based on SCr of 0.65 mg/dL).  Assessment:  42yr female brought to ED c/o sudden aphasia with dizziness.  Initial CT noted as negative and pt determined not a candidate for tPA.  Patient continues to be worked up for acute TIAs vs CVA.  PMH significant for DM, HLD, HTN.  While in ED, patient found to be in new onset AFib.  Head CT noted no acute hemorrhage or infarct.  Pharmacy consulted to transition from IV heparin to Apixaban for AFib   Age >80 , wt >60 kg, SCr <1.5, thus apixaban dose will be 5 mg BID.   No issues with line or bleeding reported per RN.  Goal of Therapy:  Monitor platelets by anticoagulation protocol: Yes   Plan:  Discontinue IV heparin now and start Apixaban 5 mg BID.  Monitor for s/sx of bleeding and renal function changes.   Nicole Cella, RPh Clinical pharmacist **Pharmacist phone directory can now be found on [email or web address] ([email or web address]).  Listed under [place]. 06/08/2018,1:14 PM

## 2018-06-08 NOTE — Evaluation (Addendum)
Speech Language Pathology Evaluation Patient Details Name: Haley Wilcox MRN: [id number] DOB: 06-Dec-1931 Today's Date: 06/08/2018 Time: 6283-6629 SLP Time Calculation (min) (ACUTE ONLY): 30 min  Problem List:  Patient Active Problem List   Diagnosis Date Noted  . TIA (transient ischemic attack) 06/07/2018  . A-fib ([place]) 06/07/2018  . Other fatigue 03/30/2017  . Type 2 diabetes mellitus without complication, without long-term current use of insulin ([place]) 03/30/2017  . Vitamin D deficiency 03/30/2017  . Idiopathic gout of left knee 06/06/2012  . Iron deficiency anemia 06/03/2012  . Sinus tachycardia 06/02/2012  . Hyperlipidemia 10/23/2010  . Diabetes mellitus type II, controlled ([place]) 11/23/2006  . Essential hypertension 10/13/2006   Past Medical History:  Past Medical History:  Diagnosis Date  . Constipation   . Diabetes mellitus   . Gout   . Hyperlipidemia   . Hypertension    Past Surgical History:  Past Surgical History:  Procedure Laterality Date  . ABDOMINAL HYSTERECTOMY    . d &c    . DILATION AND CURETTAGE OF UTERUS    . EYE SURGERY     bilateral cataract  . TUBAL LIGATION     HPI:  85 yo F h/o L eye decreased vision since childhood, HTN, DM, HL, hypothyroidism p/w 2 episodes of aphasia. MRI shows Approximate 2 cm patchy acute ischemic nonhemorrhagic left ACA territory infarct involving the parasagittal left frontal lobe.   Assessment / Plan / Recommendation Clinical Impression  Pt demonstrates moderate deficits in expressive lanaguage characterized by non fluent telegraphic language at conversation level with occasional mild paraphasias. Pt relatively accurate in confrontational naming tasks but recognizes deficits with incomplete grammatical structures in longer phrases and sentences. Pt also demonstrates deficits with auditory comprehension with complex vebal tasks involving verbs and prepositions. Introduced basic strategies and education to daughter at  bedside. Recommend pt f/u with intensive SLP interventions potentially at CIR level to maximize recovery. WIll f/u acutely while admitted.     SLP Assessment       Follow Up Recommendations       Frequency and Duration min 2x/week  2 weeks      SLP Evaluation Cognition  Overall Cognitive Status: Impaired/Different from baseline Arousal/Alertness: Awake/alert Orientation Level: Oriented X4 Attention: Sustained Sustained Attention: Appears intact Memory: Appears intact Awareness: Appears intact Problem Solving: Impaired Problem Solving Impairment: Functional complex;Verbal complex       Comprehension  Auditory Comprehension Overall Auditory Comprehension: Impaired Yes/No Questions: Within Functional Limits Commands: Impaired One Step Basic Commands: 75-100% accurate Multistep Basic Commands: 25-49% accurate    Expression Verbal Expression Overall Verbal Expression: Impaired Initiation: No impairment Automatic Speech: Name;Social Response Level of Generative/Spontaneous Verbalization: Conversation;Phrase Repetition: No impairment Naming: No impairment Pragmatics: No impairment   Oral / Motor  Oral Motor/Sensory Function Overall Oral Motor/Sensory Function: Within functional limits Motor Speech Overall Motor Speech: Appears within functional limits for tasks assessed   GO                   Herbie Baltimore, MA CCC-SLP  Acute Rehabilitation Services Pager [phone] Office [phone]  Lynann Beaver 06/08/2018, 10:16 AM

## 2018-06-08 NOTE — Progress Notes (Addendum)
[facility] PROGRESS NOTE  Haley Wilcox JJO:[id number] DOB: 03/08/1932 DOA: 06/07/2018  PCP: Seward Carol, MD  Brief History/Interval Summary: 85 y.o. female with medical history significant of diabetes, hypertension, hyperlipidemia, iron deficiency anemia who presented to the ER with sudden onset of aphasia.  She was with her daughter on the phone when it happened.    She had another episode while she was in the emergency department.  Patient was subsequently hospitalized.    Reason for Visit: Acute stroke  Consultants: Neurology  Procedures:   Transthoracic echocardiogram is pending   Antibiotics: None  Subjective/Interval History: Patient states that her speech has improved although she still has difficulty finding words.  Patient daughter is at the bedside.  Patient denies any weakness in arms or legs.  ROS: Denies any headaches.  Objective:  Vital Signs  Vitals:   06/08/18 0300 06/08/18 0400 06/08/18 0500 06/08/18 0745  BP: (!) 128/111 (!) 197/88 (!) 174/98 (!) 179/73  Pulse: 77 77 66 73  Resp: 14 17 17 18   Temp:  98.8 F (37.1 C)  97.8 F (36.6 C)  TempSrc:  Oral  Oral  SpO2: 99% 99% 98% 98%  Weight:      Height:        Intake/Output Summary (Last 24 hours) at 06/08/2018 1147 Last data filed at 06/08/2018 0900 Gross per 24 hour  Intake 613.43 ml  Output 900 ml  Net -286.57 ml   Filed Weights   06/07/18 2300  Weight: 81.3 kg    General appearance: alert, cooperative, appears stated age and no distress Head: Normocephalic, without obvious abnormality, atraumatic Resp: clear to auscultation bilaterally Cardio: regular rate and rhythm, S1, S2 normal, no murmur, click, rub or gallop GI: soft, non-tender; bowel sounds normal; no masses,  no organomegaly Extremities: extremities normal, atraumatic, no cyanosis or edema Pulses: 2+ and symmetric Neurologic: Mild expressive aphasia noted.  Otherwise other cranial nerves are normal.  Motor  strength equal bilateral upper and lower extremities.  No pronator drift.  Lab Results:  Data Reviewed: I have personally reviewed following labs and imaging studies  CBC: Recent Labs  Lab 06/07/18 1622 06/08/18 0509  WBC 7.4 8.3  NEUTROABS 4.6  --   HGB 12.6 12.9  HCT 39.9 37.8  MCV 89.5 84.6  PLT 231 160    Basic Metabolic Panel: Recent Labs  Lab 06/07/18 1622 06/07/18 1630 06/08/18 0509  NA 139  --  139  K 3.5  --  3.0*  CL 101  --  105  CO2 29  --  28  GLUCOSE 167*  --  132*  BUN 19  --  10  CREATININE 0.74 0.80 0.65  CALCIUM 9.6  --  9.6    GFR: Estimated Creatinine Clearance: 51.7 mL/min (by C-G formula based on SCr of 0.65 mg/dL).  Liver Function Tests: Recent Labs  Lab 06/07/18 1622 06/08/18 0509  AST 21 20  ALT 16 16  ALKPHOS 69 67  BILITOT 0.4 0.3  PROT 6.9 6.6  ALBUMIN 3.6 3.3*     Coagulation Profile: Recent Labs  Lab 06/07/18 1622  INR 1.0    HbA1C: Recent Labs    06/08/18 0509  HGBA1C 6.7*    CBG: Recent Labs  Lab 06/08/18 0019 06/08/18 0631  GLUCAP 132* 113*    Lipid Profile: Recent Labs    06/08/18 0509  CHOL 241*  HDL 53  LDLCALC 155*  TRIG 167*  CHOLHDL 4.5     Radiology Studies:  Ct Head Wo Contrast  Result Date: 06/07/2018 CLINICAL DATA:  Altered speech, dizzy EXAM: CT HEAD WITHOUT CONTRAST TECHNIQUE: Contiguous axial images were obtained from the base of the skull through the vertex without intravenous contrast. COMPARISON:  CT 08/16/2014 FINDINGS: Brain: No evidence of acute infarction, hemorrhage, hydrocephalus, extra-axial collection or mass lesion/mass effect. Vascular: No hyperdense vessels.  Carotid vascular calcification Skull: Normal. Negative for fracture or focal lesion. Sinuses/Orbits: Mucosal thickening in the ethmoid sinuses. Mucous retention cyst left maxillary sinus Other: None IMPRESSION: Negative non contrasted CT appearance of the brain Electronically Signed   By: Donavan Foil M.D.   On:  06/07/2018 17:01   Mr Jodene Nam Head Wo Contrast  Result Date: 06/07/2018 CLINICAL DATA:  Initial evaluation for acute dizziness, word-finding difficulty. EXAM: MRI HEAD WITHOUT CONTRAST MRA HEAD WITHOUT CONTRAST MRA NECK WITHOUT AND WITH CONTRAST TECHNIQUE: Multiplanar, multiecho pulse sequences of the brain and surrounding structures were obtained without intravenous contrast. Angiographic images of the Circle of Willis were obtained using MRA technique without intravenous contrast. Angiographic images of the neck were obtained using MRA technique without and with intravenous contrast. Carotid stenosis measurements (when applicable) are obtained utilizing NASCET criteria, using the distal internal carotid diameter as the denominator. CONTRAST:  7 cc of Gadavist. COMPARISON:  Prior CT from earlier the same day. FINDINGS: MRI HEAD FINDINGS Cerebral volume within normal limits. Mild chronic micro vessel ischemic disease present within the periventricular deep white matter both cerebral hemispheres. Approximate 2 cm focus of patchy diffusion abnormality present within the parasagittal left frontal lobe, compatible with acute left ACA territory infarct (series 4, image 36). No associated mass effect or hemorrhage. No other evidence for acute or subacute ischemia. Gray-white matter differentiation otherwise maintained. No acute intracranial hemorrhage. Few scattered chronic micro hemorrhages noted within the brain, likely related to small vessel disease and/or hypertension. No mass lesion, midline shift or mass effect. No hydrocephalus. No extra-axial fluid collection. Pituitary gland suprasellar region normal. Midline structures intact. Major intracranial vascular flow voids maintained. Craniocervical junction normal. Bone marrow signal intensity within normal limits. Hyperostosis frontalis interna noted. Scalp soft tissues unremarkable. Patient status post bilateral ocular lens replacement. Scattered mucosal thickening  throughout the paranasal sinuses with superimposed left maxillary sinus retention cyst. Paranasal sinuses are otherwise clear. No mastoid effusion. Inner ear structures normal. MRA HEAD FINDINGS ANTERIOR CIRCULATION: Distal cervical segments of the internal carotid arteries are widely patent with antegrade flow. Petrous segments widely patent. Atheromatous change within the cavernous/supraclinoid ICAs bilaterally, right worse than left. Resultant moderate to severe stenosis at the supraclinoid right ICA (series 13, image 133). Focal moderate to severe stenosis at the anterior genu of the cavernous left ICA (series 13, image 133). ICA termini widely patent. Left A1 patent. Right A1 hypoplastic and/or absent. Normal anterior communicating artery. Anterior cerebral arteries patent proximally. Left ACA attenuated distally, and may be partially occluded (series 13, image 217). M1 segments widely patent. Normal MCA bifurcations. Distal MCA branches well perfused and symmetric. POSTERIOR CIRCULATION: Vertebral arteries patent to the vertebrobasilar junction without stenosis. Right vertebral artery dominant. Right PICA patent. Left PICA not seen. Basilar widely patent to its distal aspect. Superior cerebral arteries patent bilaterally. Left PCA widely patent to its distal aspect. Focal moderate proximal right P2 stenosis. Right PCA otherwise widely patent. Small bilateral posterior communicating arteries noted. No intracranial aneurysm. MRA NECK FINDINGS Source images reviewed. Patent antegrade flow seen within the carotid and vertebral arteries bilaterally on time-of-flight images. Visualized aortic arch of normal caliber.  Probable aberrant right subclavian artery. No flow-limiting stenosis about the origin of the great vessels. Visualized subclavian arteries widely patent. Right common carotid artery arises directly from the aortic arch. Right CCA patent to the bifurcation without high-grade stenosis. Atheromatous  stenosis at the origin of the right ICA measuring up to approximately 70% by NASCET criteria. Right ICA otherwise widely patent distally to the skull base. Left common carotid artery widely patent from its origin to the bifurcation. Mild atheromatous narrowing about the left bifurcation/proximal left ICA with relatively mild no more than 20-25% narrowing. Left ICA widely patent distally to the skull base without stenosis or occlusion. Both of the vertebral arteries arise from the subclavian arteries. Right vertebral artery dominant. Vertebral arteries patent within the neck without flow-limiting stenosis or occlusion. IMPRESSION: MRI HEAD IMPRESSION: 1. Approximate 2 cm patchy acute ischemic nonhemorrhagic left ACA territory infarct involving the parasagittal left frontal lobe. 2. Underlying age-related cerebral atrophy with mild chronic microvascular ischemic disease. MRA HEAD IMPRESSION: 1. Attenuation of the distal left ACA with loss of flow related signal, likely occluded given the acute ACA infarct. 2. Atheromatous plaque involving the carotid siphons bilaterally with resultant moderate to severe multifocal stenoses as above. 3. Focal moderate proximal right P2 stenosis. 4. Dominant left A1 segment, with the anterior cerebral arteries primarily supplied via the left carotid artery system. MRA NECK IMPRESSION: 1. Approximate 70% atheromatous stenosis at the origin of the right ICA. 2. Relatively mild no more than 20-25% narrowing at the origin of the left ICA. No other flow-limiting stenosis within the left carotid artery system. 3. Widely patent vertebral arteries within the neck. Right vertebral artery is dominant. 4. Aberrant right subclavian artery. Electronically Signed   By: Jeannine Boga M.D.   On: 06/07/2018 21:59   Mr Angiogram Neck W Or Wo Contrast  Result Date: 06/07/2018 CLINICAL DATA:  Initial evaluation for acute dizziness, word-finding difficulty. EXAM: MRI HEAD WITHOUT CONTRAST MRA  HEAD WITHOUT CONTRAST MRA NECK WITHOUT AND WITH CONTRAST TECHNIQUE: Multiplanar, multiecho pulse sequences of the brain and surrounding structures were obtained without intravenous contrast. Angiographic images of the Circle of Willis were obtained using MRA technique without intravenous contrast. Angiographic images of the neck were obtained using MRA technique without and with intravenous contrast. Carotid stenosis measurements (when applicable) are obtained utilizing NASCET criteria, using the distal internal carotid diameter as the denominator. CONTRAST:  7 cc of Gadavist. COMPARISON:  Prior CT from earlier the same day. FINDINGS: MRI HEAD FINDINGS Cerebral volume within normal limits. Mild chronic micro vessel ischemic disease present within the periventricular deep white matter both cerebral hemispheres. Approximate 2 cm focus of patchy diffusion abnormality present within the parasagittal left frontal lobe, compatible with acute left ACA territory infarct (series 4, image 36). No associated mass effect or hemorrhage. No other evidence for acute or subacute ischemia. Gray-white matter differentiation otherwise maintained. No acute intracranial hemorrhage. Few scattered chronic micro hemorrhages noted within the brain, likely related to small vessel disease and/or hypertension. No mass lesion, midline shift or mass effect. No hydrocephalus. No extra-axial fluid collection. Pituitary gland suprasellar region normal. Midline structures intact. Major intracranial vascular flow voids maintained. Craniocervical junction normal. Bone marrow signal intensity within normal limits. Hyperostosis frontalis interna noted. Scalp soft tissues unremarkable. Patient status post bilateral ocular lens replacement. Scattered mucosal thickening throughout the paranasal sinuses with superimposed left maxillary sinus retention cyst. Paranasal sinuses are otherwise clear. No mastoid effusion. Inner ear structures normal. MRA HEAD  FINDINGS ANTERIOR CIRCULATION: Distal  cervical segments of the internal carotid arteries are widely patent with antegrade flow. Petrous segments widely patent. Atheromatous change within the cavernous/supraclinoid ICAs bilaterally, right worse than left. Resultant moderate to severe stenosis at the supraclinoid right ICA (series 13, image 133). Focal moderate to severe stenosis at the anterior genu of the cavernous left ICA (series 13, image 133). ICA termini widely patent. Left A1 patent. Right A1 hypoplastic and/or absent. Normal anterior communicating artery. Anterior cerebral arteries patent proximally. Left ACA attenuated distally, and may be partially occluded (series 13, image 217). M1 segments widely patent. Normal MCA bifurcations. Distal MCA branches well perfused and symmetric. POSTERIOR CIRCULATION: Vertebral arteries patent to the vertebrobasilar junction without stenosis. Right vertebral artery dominant. Right PICA patent. Left PICA not seen. Basilar widely patent to its distal aspect. Superior cerebral arteries patent bilaterally. Left PCA widely patent to its distal aspect. Focal moderate proximal right P2 stenosis. Right PCA otherwise widely patent. Small bilateral posterior communicating arteries noted. No intracranial aneurysm. MRA NECK FINDINGS Source images reviewed. Patent antegrade flow seen within the carotid and vertebral arteries bilaterally on time-of-flight images. Visualized aortic arch of normal caliber. Probable aberrant right subclavian artery. No flow-limiting stenosis about the origin of the great vessels. Visualized subclavian arteries widely patent. Right common carotid artery arises directly from the aortic arch. Right CCA patent to the bifurcation without high-grade stenosis. Atheromatous stenosis at the origin of the right ICA measuring up to approximately 70% by NASCET criteria. Right ICA otherwise widely patent distally to the skull base. Left common carotid artery widely  patent from its origin to the bifurcation. Mild atheromatous narrowing about the left bifurcation/proximal left ICA with relatively mild no more than 20-25% narrowing. Left ICA widely patent distally to the skull base without stenosis or occlusion. Both of the vertebral arteries arise from the subclavian arteries. Right vertebral artery dominant. Vertebral arteries patent within the neck without flow-limiting stenosis or occlusion. IMPRESSION: MRI HEAD IMPRESSION: 1. Approximate 2 cm patchy acute ischemic nonhemorrhagic left ACA territory infarct involving the parasagittal left frontal lobe. 2. Underlying age-related cerebral atrophy with mild chronic microvascular ischemic disease. MRA HEAD IMPRESSION: 1. Attenuation of the distal left ACA with loss of flow related signal, likely occluded given the acute ACA infarct. 2. Atheromatous plaque involving the carotid siphons bilaterally with resultant moderate to severe multifocal stenoses as above. 3. Focal moderate proximal right P2 stenosis. 4. Dominant left A1 segment, with the anterior cerebral arteries primarily supplied via the left carotid artery system. MRA NECK IMPRESSION: 1. Approximate 70% atheromatous stenosis at the origin of the right ICA. 2. Relatively mild no more than 20-25% narrowing at the origin of the left ICA. No other flow-limiting stenosis within the left carotid artery system. 3. Widely patent vertebral arteries within the neck. Right vertebral artery is dominant. 4. Aberrant right subclavian artery. Electronically Signed   By: Jeannine Boga M.D.   On: 06/07/2018 21:59   Mr Brain Wo Contrast  Result Date: 06/07/2018 CLINICAL DATA:  Initial evaluation for acute dizziness, word-finding difficulty. EXAM: MRI HEAD WITHOUT CONTRAST MRA HEAD WITHOUT CONTRAST MRA NECK WITHOUT AND WITH CONTRAST TECHNIQUE: Multiplanar, multiecho pulse sequences of the brain and surrounding structures were obtained without intravenous contrast. Angiographic  images of the Circle of Willis were obtained using MRA technique without intravenous contrast. Angiographic images of the neck were obtained using MRA technique without and with intravenous contrast. Carotid stenosis measurements (when applicable) are obtained utilizing NASCET criteria, using the distal internal carotid diameter as the  denominator. CONTRAST:  7 cc of Gadavist. COMPARISON:  Prior CT from earlier the same day. FINDINGS: MRI HEAD FINDINGS Cerebral volume within normal limits. Mild chronic micro vessel ischemic disease present within the periventricular deep white matter both cerebral hemispheres. Approximate 2 cm focus of patchy diffusion abnormality present within the parasagittal left frontal lobe, compatible with acute left ACA territory infarct (series 4, image 36). No associated mass effect or hemorrhage. No other evidence for acute or subacute ischemia. Gray-white matter differentiation otherwise maintained. No acute intracranial hemorrhage. Few scattered chronic micro hemorrhages noted within the brain, likely related to small vessel disease and/or hypertension. No mass lesion, midline shift or mass effect. No hydrocephalus. No extra-axial fluid collection. Pituitary gland suprasellar region normal. Midline structures intact. Major intracranial vascular flow voids maintained. Craniocervical junction normal. Bone marrow signal intensity within normal limits. Hyperostosis frontalis interna noted. Scalp soft tissues unremarkable. Patient status post bilateral ocular lens replacement. Scattered mucosal thickening throughout the paranasal sinuses with superimposed left maxillary sinus retention cyst. Paranasal sinuses are otherwise clear. No mastoid effusion. Inner ear structures normal. MRA HEAD FINDINGS ANTERIOR CIRCULATION: Distal cervical segments of the internal carotid arteries are widely patent with antegrade flow. Petrous segments widely patent. Atheromatous change within the  cavernous/supraclinoid ICAs bilaterally, right worse than left. Resultant moderate to severe stenosis at the supraclinoid right ICA (series 13, image 133). Focal moderate to severe stenosis at the anterior genu of the cavernous left ICA (series 13, image 133). ICA termini widely patent. Left A1 patent. Right A1 hypoplastic and/or absent. Normal anterior communicating artery. Anterior cerebral arteries patent proximally. Left ACA attenuated distally, and may be partially occluded (series 13, image 217). M1 segments widely patent. Normal MCA bifurcations. Distal MCA branches well perfused and symmetric. POSTERIOR CIRCULATION: Vertebral arteries patent to the vertebrobasilar junction without stenosis. Right vertebral artery dominant. Right PICA patent. Left PICA not seen. Basilar widely patent to its distal aspect. Superior cerebral arteries patent bilaterally. Left PCA widely patent to its distal aspect. Focal moderate proximal right P2 stenosis. Right PCA otherwise widely patent. Small bilateral posterior communicating arteries noted. No intracranial aneurysm. MRA NECK FINDINGS Source images reviewed. Patent antegrade flow seen within the carotid and vertebral arteries bilaterally on time-of-flight images. Visualized aortic arch of normal caliber. Probable aberrant right subclavian artery. No flow-limiting stenosis about the origin of the great vessels. Visualized subclavian arteries widely patent. Right common carotid artery arises directly from the aortic arch. Right CCA patent to the bifurcation without high-grade stenosis. Atheromatous stenosis at the origin of the right ICA measuring up to approximately 70% by NASCET criteria. Right ICA otherwise widely patent distally to the skull base. Left common carotid artery widely patent from its origin to the bifurcation. Mild atheromatous narrowing about the left bifurcation/proximal left ICA with relatively mild no more than 20-25% narrowing. Left ICA widely patent  distally to the skull base without stenosis or occlusion. Both of the vertebral arteries arise from the subclavian arteries. Right vertebral artery dominant. Vertebral arteries patent within the neck without flow-limiting stenosis or occlusion. IMPRESSION: MRI HEAD IMPRESSION: 1. Approximate 2 cm patchy acute ischemic nonhemorrhagic left ACA territory infarct involving the parasagittal left frontal lobe. 2. Underlying age-related cerebral atrophy with mild chronic microvascular ischemic disease. MRA HEAD IMPRESSION: 1. Attenuation of the distal left ACA with loss of flow related signal, likely occluded given the acute ACA infarct. 2. Atheromatous plaque involving the carotid siphons bilaterally with resultant moderate to severe multifocal stenoses as above. 3. Focal moderate proximal  right P2 stenosis. 4. Dominant left A1 segment, with the anterior cerebral arteries primarily supplied via the left carotid artery system. MRA NECK IMPRESSION: 1. Approximate 70% atheromatous stenosis at the origin of the right ICA. 2. Relatively mild no more than 20-25% narrowing at the origin of the left ICA. No other flow-limiting stenosis within the left carotid artery system. 3. Widely patent vertebral arteries within the neck. Right vertebral artery is dominant. 4. Aberrant right subclavian artery. Electronically Signed   By: Jeannine Boga M.D.   On: 06/07/2018 21:59   Ct Head Code Stroke Wo Contrast`  Result Date: 06/07/2018 CLINICAL DATA:  Code stroke.    Inability to speak 715 tonight EXAM: CT HEAD WITHOUT CONTRAST TECHNIQUE: Contiguous axial images were obtained from the base of the skull through the vertex without intravenous contrast. COMPARISON:  CT head 06/07/2018 FINDINGS: Brain: Ventricle size and cerebral volume normal. Mild hypodensity in the periventricular white matter appears chronic. Negative for acute infarct, hemorrhage, mass Vascular: Negative for hyperdense vessel. Atherosclerotic calcification.  Skull: Negative Sinuses/Orbits: Mild mucosal edema paranasal sinuses. Bilateral cataract surgery Other: None ASPECTS ([place] Stroke Program Early CT Score) - Ganglionic level infarction (caudate, lentiform nuclei, internal capsule, insula, M1-M3 cortex): 7 - Supraganglionic infarction (M4-M6 cortex): 3 Total score (0-10 with 10 being normal): 10 IMPRESSION: 1. No acute intracranial abnormality. Mild chronic white matter disease 2. ASPECTS is 10 3. These results were called by telephone at the time of interpretation on 06/07/2018 at 7:52 pm to Dr. Dorie Rank , who verbally acknowledged these results. Electronically Signed   By: Franchot Gallo M.D.   On: 06/07/2018 19:53   Vas US Carotid (at [place] Only)  Result Date: 06/08/2018 Carotid Arterial Duplex Study Indications: TIA. Performing Technologist: Oliver Hum RVT  Examination Guidelines: A complete evaluation includes B-mode imaging, spectral Doppler, color Doppler, and power Doppler as needed of all accessible portions of each vessel. Bilateral testing is considered an integral part of a complete examination. Limited examinations for reoccurring indications may be performed as noted.  Right Carotid Findings: +----------+--------+-------+--------+--------------------------------+--------+             PSV cm/s EDV     Stenosis Describe                         Comments                       cm/s                                                        +----------+--------+-------+--------+--------------------------------+--------+  CCA Prox   64       10               smooth and heterogenous                    +----------+--------+-------+--------+--------------------------------+--------+  CCA Distal 49       11               smooth and heterogenous                    +----------+--------+-------+--------+--------------------------------+--------+  ICA Prox   138      53      40-59%   smooth,  heterogenous and                                                          calcific                                   +----------+--------+-------+--------+--------------------------------+--------+  ICA Distal 47       16                                                tortuous  +----------+--------+-------+--------+--------------------------------+--------+  ECA        58       6                                                           +----------+--------+-------+--------+--------------------------------+--------+ +----------+--------+-------+--------+-------------------+             PSV cm/s EDV cms Describe Arm Pressure (mmHG)  +----------+--------+-------+--------+-------------------+  Subclavian 88                                             +----------+--------+-------+--------+-------------------+ +---------+--------+--+--------+--+---------+  Vertebral PSV cm/s 72 EDV cm/s 23 Antegrade  +---------+--------+--+--------+--+---------+  Left Carotid Findings: +----------+--------+--------+--------+-----------------------+--------+             PSV cm/s EDV cm/s Stenosis Describe                Comments  +----------+--------+--------+--------+-----------------------+--------+  CCA Prox   72       13                smooth and heterogenous           +----------+--------+--------+--------+-----------------------+--------+  CCA Distal 54       12                smooth and heterogenous           +----------+--------+--------+--------+-----------------------+--------+  ICA Prox   72       21                smooth and heterogenous tortuous  +----------+--------+--------+--------+-----------------------+--------+  ICA Distal 47       17                                        tortuous  +----------+--------+--------+--------+-----------------------+--------+  ECA        64       7                                                   +----------+--------+--------+--------+-----------------------+--------+ +----------+--------+--------+--------+-------------------+  Subclavian PSV cm/s EDV  cm/s Describe Arm Pressure (mmHG)  +----------+--------+--------+--------+-------------------+             118                                             +----------+--------+--------+--------+-------------------+ +---------+--------+--+--------+-+---------+  Vertebral PSV cm/s 34 EDV cm/s 8 Antegrade  +---------+--------+--+--------+-+---------+  Summary: Right Carotid: Velocities in the right ICA are consistent with a 40-59%                stenosis. Left Carotid: Velocities in the left ICA are consistent with a 1-39% stenosis. Vertebrals: Bilateral vertebral arteries demonstrate antegrade flow. *See table(s) above for measurements and observations.     Preliminary      Medications:  Scheduled:  amLODipine  10 mg Oral QPM   carvedilol  12.5 mg Oral BID WC   colchicine  0.6 mg Oral QPM   doxazosin  2 mg Oral QPM   losartan  100 mg Oral Daily   And   hydrochlorothiazide  25 mg Oral Daily   insulin aspart  0-9 Units Subcutaneous TID WC   levothyroxine  25 mcg Oral QAC breakfast   multivitamin with minerals  1 tablet Oral Daily   pantoprazole  40 mg Oral Daily   polyethylene glycol  17 g Oral Daily   Continuous:  sodium chloride 75 mL/hr at 06/08/18 0452   sodium chloride 0 mL/hr at 06/08/18 0452   heparin 1,050 Units/hr (06/08/18 0600)   EXB:MWUXLK chloride, acetaminophen **OR** acetaminophen (TYLENOL) oral liquid 160 mg/5 mL **OR** acetaminophen, diphenhydrAMINE **AND** acetaminophen, senna-docusate    Assessment/Plan:   Acute stroke, possibly embolic Left ACA territory infarct was noted on MRI.  This is in the setting of newly diagnosed atrial fibrillation.  Neurology input is pending.  Patient noted to be on IV heparin.  Not noted to be on aspirin.  LDL 155.  HbA1c 6.7.  Carotid Doppler without any significant stenosis.  Atherosclerotic disease noted on MRI as well.  Will defer to neurology.  Patient not noted to be on a statin which will need to be initiated.  PT and  OT evaluation.  Speech therapy to see as well.  Discussed with Dr. Leonie Man who recommends Eliquis.  We will stop heparin.  Patient will need to see cardiology in the outpatient setting.  Newly diagnosed atrial fibrillation, likely chronic Noted on EKG.  Patient denies any history of palpitations.  EKG from 2016 showed sinus rhythm.  There is an EKG done in January which did show atrial fibrillation.  Check TSH.  Echocardiogram.  In view of her stroke she will likely need to be on anticoagulation.  Diabetes mellitus type 2 On metformin at home.  HbA1c 6.7.  SSI.  Hyperlipidemia Will need to be on a statin.  Not noted to be on one at home.  Essential hypertension Allow permissive hypertension due to stroke.  Blood pressure noted to be elevated.  Hypothyroidism Noted to be on levothyroxine.  Check TSH and free T4.  Hypokalemia This will be repleted.  Recheck labs tomorrow.   DVT Prophylaxis: Currently on IV heparin    Code Status: Full code Family Communication: Discussed with the patient and her daughter Disposition Plan: Management as outlined above.  Await PT and OT evaluation.    LOS: 0 days   Ryne Mctigue [employer] on [email or web address]  06/08/2018, 11:47 AM

## 2018-06-08 NOTE — Progress Notes (Signed)
Admitted t0 3W07 via [place].  Denies pain, A&O x 4, speech clear, see neuro flowsheet for assessment.  Patient & family oriented to room, plan of care & safety precautions.

## 2018-06-08 NOTE — Progress Notes (Signed)
ANTICOAGULATION CONSULT NOTE - Follow-up Consult  Pharmacy Consult for Heparin Indication: New onset AFib in the setting of CVA vs TIA  Allergies  Allergen Reactions  . Fluoride Preparations Swelling    "Lips swell" when brushing teeth with toothpaste that has flouride   . Ivp Dye [Iodinated Diagnostic Agents] Rash    Patient Measurements: Height: 5\' 2"  (157.5 cm) Weight: 179 lb 3.7 oz (81.3 kg) IBW/kg (Calculated) : 50.1 Heparin Dosing Weight: 68 kg   Vital Signs: Temp: 98.8 F (37.1 C) (03/18 0400) Temp Source: Oral (03/18 0400) BP: 174/98 (03/18 0500) Pulse Rate: 77 (03/18 0400)  Labs: Recent Labs    06/07/18 1622 06/07/18 1630 06/08/18 0509  HGB 12.6  --  12.9  HCT 39.9  --  37.8  PLT 231  --  235  APTT 30  --   --   LABPROT 12.6  --   --   INR 1.0  --   --   HEPARINUNFRC  --   --  0.25*  CREATININE 0.74 0.80  --     Estimated Creatinine Clearance: 51.7 mL/min (by C-G formula based on SCr of 0.8 mg/dL).  Assessment:  83yr female brought to ED c/o sudden aphasia with dizziness.  Initial CT noted as negative and pt determined not a candidate for tPA.  Patient continues to be worked up for acute TIAs vs CVA.  PMH significant for DM, HLD, HTN.  While in ED, patient found to be in new onset AFib.  Head CT noted no acute hemorrhage or infarct  Pharmacy consulted to dose IV heparin for AFib  Heparin level subtherapeutic (0.25) on gtt at 900 units/hr. No issues with line or bleeding reported per RN.  Goal of Therapy:  Heparin level 0.3-0.5 units/ml Monitor platelets by anticoagulation protocol: Yes   Plan:   Increase heparin to 1050 units/hr  Check heparin level 8 hr after heparin started  Follow heparin level and CBC daily while on IV heparin  Sherlon Handing, PharmD, BCPS Clinical pharmacist  **Pharmacist phone directory can now be found on [email or web address] ([email or web address]).  Listed under [place]. 06/08/2018,5:54 AM

## 2018-06-08 NOTE — Progress Notes (Signed)
Carotid artery duplex has been completed. Preliminary results can be found in CV Proc through chart review.   06/08/18 8:24 AM Haley Wilcox RVT

## 2018-06-09 LAB — BASIC METABOLIC PANEL
Anion gap: 12 (ref 5–15)
BUN: 18 mg/dL (ref 8–23)
CO2: 25 mmol/L (ref 22–32)
Calcium: 9.7 mg/dL (ref 8.9–10.3)
Chloride: 102 mmol/L (ref 98–111)
Creatinine, Ser: 0.8 mg/dL (ref 0.44–1.00)
GFR calc Af Amer: >60 mL/min (ref >60)
GFR calc non Af Amer: >60 mL/min (ref >60)
Glucose, Bld: 126 mg/dL — ABNORMAL HIGH (ref 70–99)
Potassium: 3.5 mmol/L (ref 3.5–5.1)
Sodium: 139 mmol/L (ref 135–145)

## 2018-06-09 LAB — GLUCOSE, CAPILLARY
Glucose-Capillary: 103 mg/dL — ABNORMAL HIGH (ref 70–99)
Glucose-Capillary: 97 mg/dL (ref 70–99)

## 2018-06-09 LAB — CBC
HCT: 39.6 % (ref 36.0–46.0)
HEMOGLOBIN: 12.8 g/dL (ref 12.0–15.0)
MCH: 27.5 pg (ref 26.0–34.0)
MCHC: 32.3 g/dL (ref 30.0–36.0)
MCV: 85.2 fL (ref 80.0–100.0)
Platelets: 227 10*3/uL (ref 150–400)
RBC: 4.65 MIL/uL (ref 3.87–5.11)
RDW: 14.2 % (ref 11.5–15.5)
WBC: 7.1 10*3/uL (ref 4.0–10.5)
nRBC: 0 % (ref 0.0–0.2)

## 2018-06-09 LAB — TSH: TSH: 6.801 u[IU]/mL — ABNORMAL HIGH (ref 0.350–4.500)

## 2018-06-09 LAB — T4, FREE: Free T4: 0.9 ng/dL (ref 0.82–1.77)

## 2018-06-09 MED ORDER — COLCRYS 0.6 MG PO TABS: 0.6000 mg | ORAL_TABLET | Freq: Every day | ORAL | Status: DC | PRN

## 2018-06-09 MED ORDER — APIXABAN 5 MG PO TABS: 5.0000 mg | ORAL_TABLET | Freq: Two times a day (BID) | ORAL | 2 refills | Status: AC

## 2018-06-09 MED ORDER — LOSARTAN POTASSIUM-HCTZ 100-25 MG PO TABS: 1.0000 | ORAL_TABLET | Freq: Every day | ORAL | Status: DC

## 2018-06-09 MED ORDER — POTASSIUM CHLORIDE ER 20 MEQ PO TBCR: 20.0000 meq | EXTENDED_RELEASE_TABLET | Freq: Every day | ORAL | 0 refills | Status: DC

## 2018-06-09 MED ORDER — ATORVASTATIN CALCIUM 40 MG PO TABS: 40.0000 mg | ORAL_TABLET | Freq: Every day | ORAL | 1 refills | Status: DC

## 2018-06-09 NOTE — TOC Transition Note (Addendum)
Transition of Care [facility]) - CM/SW Discharge Note   Patient Details  Name: Haley Wilcox MRN: [id number] Date of Birth: [birth year]  [facility] CM/SW Contact:  Pollie Friar, RN Phone Number: 06/09/2018, 12:32 PM   Clinical Narrative:    Daughter to provide supervision at home and transportation to home. Pt discharging on [place]. CM provided 30 day free card. Daughter to f/u with pharmacy on cost after first 30 days. She will let pt's PCP know if unable to afford.   Final next level of care: [place] Barriers to Discharge: No Barriers Identified   Patient Goals and CMS Choice Patient states their goals for this hospitalization and ongoing recovery are:: get back to normal CMS Medicare.gov Compare Post Acute Care list provided to:: Patient Choice offered to / list presented to : Adult Children, Patient  Discharge Placement                       Discharge Plan and Services   Discharge Planning Services: CM Consult Post Acute Care Choice: Home Health              HH Arranged: PT, OT, Speech Therapy [place] Agency: [facility]--[facility] with [facility] accepted the referral.   Social Determinants of Health (SDOH) Interventions     Readmission Risk Interventions No flowsheet data found.

## 2018-06-09 NOTE — Discharge Summary (Signed)
[facility]  Physician Discharge Summary   Patient ID: Haley Wilcox MRN: [id number] DOB/AGE: 83-May-1933 83 y.o.  Admit date: 06/07/2018 Discharge date: 06/09/2018  PCP: Seward Carol, MD  DISCHARGE DIAGNOSES:  Acute stroke likely embolic Newly diagnosed atrial fibrillation Diabetes mellitus type 2 Hyperlipidemia Essential hypertension Hypothyroidism Hypokalemia   RECOMMENDATIONS FOR OUTPATIENT FOLLOW UP: 1. Outpatient referral to cardiology has been arranged 2. Follow-up with PCP for blood pressure management. 3. Please see thyroid function test results.  Will benefit from retesting in a few weeks.    Home Health: OT PT speech Equipment/Devices: None  CODE STATUS: Full code  DISCHARGE CONDITION: fair  Diet recommendation: Modified carbohydrate  INITIAL HISTORY: 85 y.o.femalewith medical history significant ofdiabetes, hypertension, hyperlipidemia, iron deficiency anemia who presented to the ER with sudden onset of aphasia. She was with her daughter on the phone when it happened.   She had another episode while she was in the emergency department.  Patient was subsequently hospitalized.    Consultations:  Neurology  Procedures: Transthoracic echocardiogram  1. The left ventricle has normal systolic function with an ejection fraction of 60-65%. The cavity size was normal. There is mildly increased left ventricular wall thickness. Left ventricular diastolic function could not be evaluated secondary to atrial  fibrillation.  2. The right ventricle has normal systolic function. The cavity was normal. There is no increase in right ventricular wall thickness.  3. Left atrial size was severely dilated.  4. Right atrial size was mildly dilated.  5. The aortic root and ascending aorta are normal in size and structure.  6. The interatrial septum was not assessed.   HOSPITAL COURSE:   Acute stroke, possibly embolic Left ACA territory infarct was noted  on MRI.  This is in the setting of newly diagnosed atrial fibrillation.    Patient seen by neurology.  Patient was initially was on IV heparin.  Changed over to apixaban.  LDL 155.  HbA1c 6.7.  Carotid Doppler without any significant stenosis.  Atherosclerotic disease noted on MRI as well.  Patient not noted to be on a statin.  She was started on atorvastatin.   Newly diagnosed atrial fibrillation, likely chronic Noted on EKG.  Patient denies any history of palpitations.  EKG from 2016 showed sinus rhythm.  There is an EKG done in January which did show atrial fibrillation.    Echocardiogram as above.  TSH 6.8 with a free T4 of 0.9.  Chads 2 vascular score of 5.  Patient on apixaban now.  Appointment made with cardiologist Dr. Virgina Jock on April 2.  Diabetes mellitus type 2 On metformin at home.  HbA1c 6.7.  SSI.  Hyperlipidemia LDL noted to be 155.  Started on atorvastatin.  Essential hypertension Allow permissive hypertension.  Slowly resume home medications.  Follow-up with PCP for blood pressure management.  Hypothyroidism Noted to be on levothyroxine.    TSH is 6.8 with a free T4 of 0.9.  Hypokalemia Potassium normal this morning.  Will be given a prescription for the same.  Overall stable.  Okay for discharge home today.  Home health ordered.   PERTINENT LABS:  The results of significant diagnostics from this hospitalization (including imaging, microbiology, ancillary and laboratory) are listed below for reference.     Labs: Basic Metabolic Panel: Recent Labs  Lab 06/07/18 1622 06/07/18 1630 06/08/18 0509 06/09/18 0428  NA 139  --  139 139  K 3.5  --  3.0* 3.5  CL 101  --  105 102  CO2 29  --  28 25  GLUCOSE 167*  --  132* 126*  BUN 19  --  10 18  CREATININE 0.74 0.80 0.65 0.80  CALCIUM 9.6  --  9.6 9.7   Liver Function Tests: Recent Labs  Lab 06/07/18 1622 06/08/18 0509  AST 21 20  ALT 16 16  ALKPHOS 69 67  BILITOT 0.4 0.3  PROT 6.9 6.6  ALBUMIN  3.6 3.3*   CBC: Recent Labs  Lab 06/07/18 1622 06/08/18 0509 06/09/18 0428  WBC 7.4 8.3 7.1  NEUTROABS 4.6  --   --   HGB 12.6 12.9 12.8  HCT 39.9 37.8 39.6  MCV 89.5 84.6 85.2  PLT 231 235 227    CBG: Recent Labs  Lab 06/08/18 1146 06/08/18 1632 06/08/18 2110 06/09/18 0614 06/09/18 1152  GLUCAP 107* 114* 128* 103* 97     IMAGING STUDIES Ct Head Wo Contrast  Result Date: 06/07/2018 CLINICAL DATA:  Altered speech, dizzy EXAM: CT HEAD WITHOUT CONTRAST TECHNIQUE: Contiguous axial images were obtained from the base of the skull through the vertex without intravenous contrast. COMPARISON:  CT 08/16/2014 FINDINGS: Brain: No evidence of acute infarction, hemorrhage, hydrocephalus, extra-axial collection or mass lesion/mass effect. Vascular: No hyperdense vessels.  Carotid vascular calcification Skull: Normal. Negative for fracture or focal lesion. Sinuses/Orbits: Mucosal thickening in the ethmoid sinuses. Mucous retention cyst left maxillary sinus Other: None IMPRESSION: Negative non contrasted CT appearance of the brain Electronically Signed   By: Donavan Foil M.D.   On: 06/07/2018 17:01   Mr Jodene Nam Head Wo Contrast  Result Date: 06/07/2018 CLINICAL DATA:  Initial evaluation for acute dizziness, word-finding difficulty. EXAM: MRI HEAD WITHOUT CONTRAST MRA HEAD WITHOUT CONTRAST MRA NECK WITHOUT AND WITH CONTRAST TECHNIQUE: Multiplanar, multiecho pulse sequences of the brain and surrounding structures were obtained without intravenous contrast. Angiographic images of the Circle of Willis were obtained using MRA technique without intravenous contrast. Angiographic images of the neck were obtained using MRA technique without and with intravenous contrast. Carotid stenosis measurements (when applicable) are obtained utilizing NASCET criteria, using the distal internal carotid diameter as the denominator. CONTRAST:  7 cc of Gadavist. COMPARISON:  Prior CT from earlier the same day. FINDINGS: MRI  HEAD FINDINGS Cerebral volume within normal limits. Mild chronic micro vessel ischemic disease present within the periventricular deep white matter both cerebral hemispheres. Approximate 2 cm focus of patchy diffusion abnormality present within the parasagittal left frontal lobe, compatible with acute left ACA territory infarct (series 4, image 36). No associated mass effect or hemorrhage. No other evidence for acute or subacute ischemia. Gray-white matter differentiation otherwise maintained. No acute intracranial hemorrhage. Few scattered chronic micro hemorrhages noted within the brain, likely related to small vessel disease and/or hypertension. No mass lesion, midline shift or mass effect. No hydrocephalus. No extra-axial fluid collection. Pituitary gland suprasellar region normal. Midline structures intact. Major intracranial vascular flow voids maintained. Craniocervical junction normal. Bone marrow signal intensity within normal limits. Hyperostosis frontalis interna noted. Scalp soft tissues unremarkable. Patient status post bilateral ocular lens replacement. Scattered mucosal thickening throughout the paranasal sinuses with superimposed left maxillary sinus retention cyst. Paranasal sinuses are otherwise clear. No mastoid effusion. Inner ear structures normal. MRA HEAD FINDINGS ANTERIOR CIRCULATION: Distal cervical segments of the internal carotid arteries are widely patent with antegrade flow. Petrous segments widely patent. Atheromatous change within the cavernous/supraclinoid ICAs bilaterally, right worse than left. Resultant moderate to severe stenosis at the supraclinoid right ICA (series 13, image 133). Focal moderate  to severe stenosis at the anterior genu of the cavernous left ICA (series 13, image 133). ICA termini widely patent. Left A1 patent. Right A1 hypoplastic and/or absent. Normal anterior communicating artery. Anterior cerebral arteries patent proximally. Left ACA attenuated distally, and  may be partially occluded (series 13, image 217). M1 segments widely patent. Normal MCA bifurcations. Distal MCA branches well perfused and symmetric. POSTERIOR CIRCULATION: Vertebral arteries patent to the vertebrobasilar junction without stenosis. Right vertebral artery dominant. Right PICA patent. Left PICA not seen. Basilar widely patent to its distal aspect. Superior cerebral arteries patent bilaterally. Left PCA widely patent to its distal aspect. Focal moderate proximal right P2 stenosis. Right PCA otherwise widely patent. Small bilateral posterior communicating arteries noted. No intracranial aneurysm. MRA NECK FINDINGS Source images reviewed. Patent antegrade flow seen within the carotid and vertebral arteries bilaterally on time-of-flight images. Visualized aortic arch of normal caliber. Probable aberrant right subclavian artery. No flow-limiting stenosis about the origin of the great vessels. Visualized subclavian arteries widely patent. Right common carotid artery arises directly from the aortic arch. Right CCA patent to the bifurcation without high-grade stenosis. Atheromatous stenosis at the origin of the right ICA measuring up to approximately 70% by NASCET criteria. Right ICA otherwise widely patent distally to the skull base. Left common carotid artery widely patent from its origin to the bifurcation. Mild atheromatous narrowing about the left bifurcation/proximal left ICA with relatively mild no more than 20-25% narrowing. Left ICA widely patent distally to the skull base without stenosis or occlusion. Both of the vertebral arteries arise from the subclavian arteries. Right vertebral artery dominant. Vertebral arteries patent within the neck without flow-limiting stenosis or occlusion. IMPRESSION: MRI HEAD IMPRESSION: 1. Approximate 2 cm patchy acute ischemic nonhemorrhagic left ACA territory infarct involving the parasagittal left frontal lobe. 2. Underlying age-related cerebral atrophy with mild  chronic microvascular ischemic disease. MRA HEAD IMPRESSION: 1. Attenuation of the distal left ACA with loss of flow related signal, likely occluded given the acute ACA infarct. 2. Atheromatous plaque involving the carotid siphons bilaterally with resultant moderate to severe multifocal stenoses as above. 3. Focal moderate proximal right P2 stenosis. 4. Dominant left A1 segment, with the anterior cerebral arteries primarily supplied via the left carotid artery system. MRA NECK IMPRESSION: 1. Approximate 70% atheromatous stenosis at the origin of the right ICA. 2. Relatively mild no more than 20-25% narrowing at the origin of the left ICA. No other flow-limiting stenosis within the left carotid artery system. 3. Widely patent vertebral arteries within the neck. Right vertebral artery is dominant. 4. Aberrant right subclavian artery. Electronically Signed   By: Jeannine Boga M.D.   On: 06/07/2018 21:59   Mr Angiogram Neck W Or Wo Contrast  Result Date: 06/07/2018 CLINICAL DATA:  Initial evaluation for acute dizziness, word-finding difficulty. EXAM: MRI HEAD WITHOUT CONTRAST MRA HEAD WITHOUT CONTRAST MRA NECK WITHOUT AND WITH CONTRAST TECHNIQUE: Multiplanar, multiecho pulse sequences of the brain and surrounding structures were obtained without intravenous contrast. Angiographic images of the Circle of Willis were obtained using MRA technique without intravenous contrast. Angiographic images of the neck were obtained using MRA technique without and with intravenous contrast. Carotid stenosis measurements (when applicable) are obtained utilizing NASCET criteria, using the distal internal carotid diameter as the denominator. CONTRAST:  7 cc of Gadavist. COMPARISON:  Prior CT from earlier the same day. FINDINGS: MRI HEAD FINDINGS Cerebral volume within normal limits. Mild chronic micro vessel ischemic disease present within the periventricular deep white matter both cerebral hemispheres.  Approximate 2 cm focus  of patchy diffusion abnormality present within the parasagittal left frontal lobe, compatible with acute left ACA territory infarct (series 4, image 36). No associated mass effect or hemorrhage. No other evidence for acute or subacute ischemia. Gray-white matter differentiation otherwise maintained. No acute intracranial hemorrhage. Few scattered chronic micro hemorrhages noted within the brain, likely related to small vessel disease and/or hypertension. No mass lesion, midline shift or mass effect. No hydrocephalus. No extra-axial fluid collection. Pituitary gland suprasellar region normal. Midline structures intact. Major intracranial vascular flow voids maintained. Craniocervical junction normal. Bone marrow signal intensity within normal limits. Hyperostosis frontalis interna noted. Scalp soft tissues unremarkable. Patient status post bilateral ocular lens replacement. Scattered mucosal thickening throughout the paranasal sinuses with superimposed left maxillary sinus retention cyst. Paranasal sinuses are otherwise clear. No mastoid effusion. Inner ear structures normal. MRA HEAD FINDINGS ANTERIOR CIRCULATION: Distal cervical segments of the internal carotid arteries are widely patent with antegrade flow. Petrous segments widely patent. Atheromatous change within the cavernous/supraclinoid ICAs bilaterally, right worse than left. Resultant moderate to severe stenosis at the supraclinoid right ICA (series 13, image 133). Focal moderate to severe stenosis at the anterior genu of the cavernous left ICA (series 13, image 133). ICA termini widely patent. Left A1 patent. Right A1 hypoplastic and/or absent. Normal anterior communicating artery. Anterior cerebral arteries patent proximally. Left ACA attenuated distally, and may be partially occluded (series 13, image 217). M1 segments widely patent. Normal MCA bifurcations. Distal MCA branches well perfused and symmetric. POSTERIOR CIRCULATION: Vertebral arteries patent  to the vertebrobasilar junction without stenosis. Right vertebral artery dominant. Right PICA patent. Left PICA not seen. Basilar widely patent to its distal aspect. Superior cerebral arteries patent bilaterally. Left PCA widely patent to its distal aspect. Focal moderate proximal right P2 stenosis. Right PCA otherwise widely patent. Small bilateral posterior communicating arteries noted. No intracranial aneurysm. MRA NECK FINDINGS Source images reviewed. Patent antegrade flow seen within the carotid and vertebral arteries bilaterally on time-of-flight images. Visualized aortic arch of normal caliber. Probable aberrant right subclavian artery. No flow-limiting stenosis about the origin of the great vessels. Visualized subclavian arteries widely patent. Right common carotid artery arises directly from the aortic arch. Right CCA patent to the bifurcation without high-grade stenosis. Atheromatous stenosis at the origin of the right ICA measuring up to approximately 70% by NASCET criteria. Right ICA otherwise widely patent distally to the skull base. Left common carotid artery widely patent from its origin to the bifurcation. Mild atheromatous narrowing about the left bifurcation/proximal left ICA with relatively mild no more than 20-25% narrowing. Left ICA widely patent distally to the skull base without stenosis or occlusion. Both of the vertebral arteries arise from the subclavian arteries. Right vertebral artery dominant. Vertebral arteries patent within the neck without flow-limiting stenosis or occlusion. IMPRESSION: MRI HEAD IMPRESSION: 1. Approximate 2 cm patchy acute ischemic nonhemorrhagic left ACA territory infarct involving the parasagittal left frontal lobe. 2. Underlying age-related cerebral atrophy with mild chronic microvascular ischemic disease. MRA HEAD IMPRESSION: 1. Attenuation of the distal left ACA with loss of flow related signal, likely occluded given the acute ACA infarct. 2. Atheromatous plaque  involving the carotid siphons bilaterally with resultant moderate to severe multifocal stenoses as above. 3. Focal moderate proximal right P2 stenosis. 4. Dominant left A1 segment, with the anterior cerebral arteries primarily supplied via the left carotid artery system. MRA NECK IMPRESSION: 1. Approximate 70% atheromatous stenosis at the origin of the right ICA. 2. Relatively mild no  more than 20-25% narrowing at the origin of the left ICA. No other flow-limiting stenosis within the left carotid artery system. 3. Widely patent vertebral arteries within the neck. Right vertebral artery is dominant. 4. Aberrant right subclavian artery. Electronically Signed   By: Jeannine Boga M.D.   On: 06/07/2018 21:59   Mr Brain Wo Contrast  Result Date: 06/07/2018 CLINICAL DATA:  Initial evaluation for acute dizziness, word-finding difficulty. EXAM: MRI HEAD WITHOUT CONTRAST MRA HEAD WITHOUT CONTRAST MRA NECK WITHOUT AND WITH CONTRAST TECHNIQUE: Multiplanar, multiecho pulse sequences of the brain and surrounding structures were obtained without intravenous contrast. Angiographic images of the Circle of Willis were obtained using MRA technique without intravenous contrast. Angiographic images of the neck were obtained using MRA technique without and with intravenous contrast. Carotid stenosis measurements (when applicable) are obtained utilizing NASCET criteria, using the distal internal carotid diameter as the denominator. CONTRAST:  7 cc of Gadavist. COMPARISON:  Prior CT from earlier the same day. FINDINGS: MRI HEAD FINDINGS Cerebral volume within normal limits. Mild chronic micro vessel ischemic disease present within the periventricular deep white matter both cerebral hemispheres. Approximate 2 cm focus of patchy diffusion abnormality present within the parasagittal left frontal lobe, compatible with acute left ACA territory infarct (series 4, image 36). No associated mass effect or hemorrhage. No other evidence  for acute or subacute ischemia. Gray-white matter differentiation otherwise maintained. No acute intracranial hemorrhage. Few scattered chronic micro hemorrhages noted within the brain, likely related to small vessel disease and/or hypertension. No mass lesion, midline shift or mass effect. No hydrocephalus. No extra-axial fluid collection. Pituitary gland suprasellar region normal. Midline structures intact. Major intracranial vascular flow voids maintained. Craniocervical junction normal. Bone marrow signal intensity within normal limits. Hyperostosis frontalis interna noted. Scalp soft tissues unremarkable. Patient status post bilateral ocular lens replacement. Scattered mucosal thickening throughout the paranasal sinuses with superimposed left maxillary sinus retention cyst. Paranasal sinuses are otherwise clear. No mastoid effusion. Inner ear structures normal. MRA HEAD FINDINGS ANTERIOR CIRCULATION: Distal cervical segments of the internal carotid arteries are widely patent with antegrade flow. Petrous segments widely patent. Atheromatous change within the cavernous/supraclinoid ICAs bilaterally, right worse than left. Resultant moderate to severe stenosis at the supraclinoid right ICA (series 13, image 133). Focal moderate to severe stenosis at the anterior genu of the cavernous left ICA (series 13, image 133). ICA termini widely patent. Left A1 patent. Right A1 hypoplastic and/or absent. Normal anterior communicating artery. Anterior cerebral arteries patent proximally. Left ACA attenuated distally, and may be partially occluded (series 13, image 217). M1 segments widely patent. Normal MCA bifurcations. Distal MCA branches well perfused and symmetric. POSTERIOR CIRCULATION: Vertebral arteries patent to the vertebrobasilar junction without stenosis. Right vertebral artery dominant. Right PICA patent. Left PICA not seen. Basilar widely patent to its distal aspect. Superior cerebral arteries patent bilaterally.  Left PCA widely patent to its distal aspect. Focal moderate proximal right P2 stenosis. Right PCA otherwise widely patent. Small bilateral posterior communicating arteries noted. No intracranial aneurysm. MRA NECK FINDINGS Source images reviewed. Patent antegrade flow seen within the carotid and vertebral arteries bilaterally on time-of-flight images. Visualized aortic arch of normal caliber. Probable aberrant right subclavian artery. No flow-limiting stenosis about the origin of the great vessels. Visualized subclavian arteries widely patent. Right common carotid artery arises directly from the aortic arch. Right CCA patent to the bifurcation without high-grade stenosis. Atheromatous stenosis at the origin of the right ICA measuring up to approximately 70% by NASCET criteria. Right ICA  otherwise widely patent distally to the skull base. Left common carotid artery widely patent from its origin to the bifurcation. Mild atheromatous narrowing about the left bifurcation/proximal left ICA with relatively mild no more than 20-25% narrowing. Left ICA widely patent distally to the skull base without stenosis or occlusion. Both of the vertebral arteries arise from the subclavian arteries. Right vertebral artery dominant. Vertebral arteries patent within the neck without flow-limiting stenosis or occlusion. IMPRESSION: MRI HEAD IMPRESSION: 1. Approximate 2 cm patchy acute ischemic nonhemorrhagic left ACA territory infarct involving the parasagittal left frontal lobe. 2. Underlying age-related cerebral atrophy with mild chronic microvascular ischemic disease. MRA HEAD IMPRESSION: 1. Attenuation of the distal left ACA with loss of flow related signal, likely occluded given the acute ACA infarct. 2. Atheromatous plaque involving the carotid siphons bilaterally with resultant moderate to severe multifocal stenoses as above. 3. Focal moderate proximal right P2 stenosis. 4. Dominant left A1 segment, with the anterior cerebral  arteries primarily supplied via the left carotid artery system. MRA NECK IMPRESSION: 1. Approximate 70% atheromatous stenosis at the origin of the right ICA. 2. Relatively mild no more than 20-25% narrowing at the origin of the left ICA. No other flow-limiting stenosis within the left carotid artery system. 3. Widely patent vertebral arteries within the neck. Right vertebral artery is dominant. 4. Aberrant right subclavian artery. Electronically Signed   By: Jeannine Boga M.D.   On: 06/07/2018 21:59   Ct Head Code Stroke Wo Contrast`  Result Date: 06/07/2018 CLINICAL DATA:  Code stroke.    Inability to speak 715 tonight EXAM: CT HEAD WITHOUT CONTRAST TECHNIQUE: Contiguous axial images were obtained from the base of the skull through the vertex without intravenous contrast. COMPARISON:  CT head 06/07/2018 FINDINGS: Brain: Ventricle size and cerebral volume normal. Mild hypodensity in the periventricular white matter appears chronic. Negative for acute infarct, hemorrhage, mass Vascular: Negative for hyperdense vessel. Atherosclerotic calcification. Skull: Negative Sinuses/Orbits: Mild mucosal edema paranasal sinuses. Bilateral cataract surgery Other: None ASPECTS ([place] Stroke Program Early CT Score) - Ganglionic level infarction (caudate, lentiform nuclei, internal capsule, insula, M1-M3 cortex): 7 - Supraganglionic infarction (M4-M6 cortex): 3 Total score (0-10 with 10 being normal): 10 IMPRESSION: 1. No acute intracranial abnormality. Mild chronic white matter disease 2. ASPECTS is 10 3. These results were called by telephone at the time of interpretation on 06/07/2018 at 7:52 pm to Dr. Dorie Rank , who verbally acknowledged these results. Electronically Signed   By: Franchot Gallo M.D.   On: 06/07/2018 19:53   Vas US Carotid (at [place] Only)  Result Date: 06/08/2018 Carotid Arterial Duplex Study Indications: TIA. Performing Technologist: Oliver Hum RVT  Examination Guidelines: A complete  evaluation includes B-mode imaging, spectral Doppler, color Doppler, and power Doppler as needed of all accessible portions of each vessel. Bilateral testing is considered an integral part of a complete examination. Limited examinations for reoccurring indications may be performed as noted.  Right Carotid Findings: +----------+--------+-------+--------+--------------------------------+--------+             PSV cm/s EDV     Stenosis Describe                         Comments                       cm/s                                                        +----------+--------+-------+--------+--------------------------------+--------+  CCA Prox   64       10               smooth and heterogenous                    +----------+--------+-------+--------+--------------------------------+--------+  CCA Distal 49       11               smooth and heterogenous                    +----------+--------+-------+--------+--------------------------------+--------+  ICA Prox   138      53      40-59%   smooth, heterogenous and                                                         calcific                                   +----------+--------+-------+--------+--------------------------------+--------+  ICA Distal 47       16                                                tortuous  +----------+--------+-------+--------+--------------------------------+--------+  ECA        58       6                                                           +----------+--------+-------+--------+--------------------------------+--------+ +----------+--------+-------+--------+-------------------+             PSV cm/s EDV cms Describe Arm Pressure (mmHG)  +----------+--------+-------+--------+-------------------+  Subclavian 88                                             +----------+--------+-------+--------+-------------------+ +---------+--------+--+--------+--+---------+  Vertebral PSV cm/s 72 EDV cm/s 23 Antegrade   +---------+--------+--+--------+--+---------+  Left Carotid Findings: +----------+--------+--------+--------+-----------------------+--------+             PSV cm/s EDV cm/s Stenosis Describe                Comments  +----------+--------+--------+--------+-----------------------+--------+  CCA Prox   72       13                smooth and heterogenous           +----------+--------+--------+--------+-----------------------+--------+  CCA Distal 54       12                smooth and heterogenous           +----------+--------+--------+--------+-----------------------+--------+  ICA Prox   72       21                smooth and heterogenous tortuous  +----------+--------+--------+--------+-----------------------+--------+  ICA Distal 47  17                                        tortuous  +----------+--------+--------+--------+-----------------------+--------+  ECA        64       7                                                   +----------+--------+--------+--------+-----------------------+--------+ +----------+--------+--------+--------+-------------------+  Subclavian PSV cm/s EDV cm/s Describe Arm Pressure (mmHG)  +----------+--------+--------+--------+-------------------+             118                                             +----------+--------+--------+--------+-------------------+ +---------+--------+--+--------+-+---------+  Vertebral PSV cm/s 34 EDV cm/s 8 Antegrade  +---------+--------+--+--------+-+---------+  Summary: Right Carotid: Velocities in the right ICA are consistent with a 40-59%                stenosis. Left Carotid: Velocities in the left ICA are consistent with a 1-39% stenosis. Vertebrals: Bilateral vertebral arteries demonstrate antegrade flow. *See table(s) above for measurements and observations.  Electronically signed by Antony Contras MD on 06/08/2018 at 12:36:08 PM.    Final     DISCHARGE EXAMINATION: Vitals:   06/09/18 0323 06/09/18 0755 06/09/18 0905 06/09/18 1304  BP:  137/86 (!) 174/87  (!) 163/74  Pulse: 66 (!) 57 74 67  Resp: 18 20  20   Temp: 98 F (36.7 C) 98.3 F (36.8 C)  98.4 F (36.9 C)  TempSrc: Oral Oral  Oral  SpO2: 99% 97%  98%  Weight:      Height:       General appearance: Awake alert.  In no distress Resp: Clear to auscultation bilaterally.  Normal effort Cardio: S1-S2 is normal regular.  No S3-S4.  No rubs murmurs or bruit GI: Abdomen is soft.  Nontender nondistended.  Bowel sounds are present normal.  No masses organomegaly Extremities: No edema.  Full range of motion of lower extremities. Neurologic: Alert and oriented x3.     DISPOSITION: Home with home health  Discharge Instructions    Ambulatory referral to Neurology   Complete by:  As directed    Follow up with stroke clinic NP (Jessica Vanschaick or Cecille Rubin, if both not available, consider Dr. Antony Contras, Dr. Bess Harvest, or Dr. Sarina Ill) at [facility] in about 4 weeks.   Call MD for:  extreme fatigue   Complete by:  As directed    Call MD for:  persistant dizziness or light-headedness   Complete by:  As directed    Call MD for:  persistant nausea and vomiting   Complete by:  As directed    Call MD for:  severe uncontrolled pain   Complete by:  As directed    Call MD for:  temperature >100.4   Complete by:  As directed    Diet - low sodium heart healthy   Complete by:  As directed    Discharge instructions   Complete by:  As directed    We will have the cardiology office get in touch with you for  outpatient appointment.  Please take your medications as prescribed.  Follow-up with your primary care provider within 1 week for blood pressure check and management.  You were cared for by a hospitalist during your hospital stay. If you have any questions about your discharge medications or the care you received while you were in the hospital after you are discharged, you can call the unit and asked to speak with the hospitalist on call  if the hospitalist that took care of you is not available. Once you are discharged, your primary care physician will handle any further medical issues. Please note that NO REFILLS for any discharge medications will be authorized once you are discharged, as it is imperative that you return to your primary care physician (or establish a relationship with a primary care physician if you do not have one) for your aftercare needs so that they can reassess your need for medications and monitor your lab values. If you do not have a primary care physician, you can call [phone] for a physician referral.   Increase activity slowly   Complete by:  As directed         Allergies as of 06/09/2018      Reactions   Fluoride Preparations Swelling   "Lips swell" when brushing teeth with toothpaste that has flouride    Ivp Dye [iodinated Diagnostic Agents] Rash      Medication List    TAKE these medications   amLODipine 10 MG tablet Commonly known as:  NORVASC Take 10 mg by mouth every evening.   apixaban 5 MG Tabs tablet Commonly known as:  ELIQUIS Take 1 tablet (5 mg total) by mouth 2 (two) times daily.   atorvastatin 40 MG tablet Commonly known as:  LIPITOR Take 1 tablet (40 mg total) by mouth daily at 6 PM.   carvedilol 12.5 MG tablet Commonly known as:  COREG Take 12.5 mg by mouth 2 (two) times daily. What changed:  Another medication with the same name was removed. Continue taking this medication, and follow the directions you see here.   Colcrys 0.6 MG tablet Generic drug:  colchicine Take 1 tablet (0.6 mg total) by mouth daily as needed. CHANGED TO AS NEEDED DUE TO POTENTIAL INTERACTION WITH STATIN MEDICATION What changed:    when to take this  reasons to take this  additional instructions   doxazosin 2 MG tablet Commonly known as:  CARDURA Take 2 mg by mouth every evening.   levothyroxine 25 MCG tablet Commonly known as:  SYNTHROID, LEVOTHROID Take 25 mcg by mouth daily.     losartan-hydrochlorothiazide 100-25 MG tablet Commonly known as:  HYZAAR Take 1 tablet by mouth daily. RESUME AFTER 3 DAYS Start taking on:  June 12, 2018 What changed:    additional instructions  These instructions start on June 12, 2018. If you are unsure what to do until then, ask your doctor or other care provider.   metFORMIN 500 MG tablet Commonly known as:  GLUCOPHAGE TAKE 1 TABLET DAILY WITH BREAKFAST   Multivitamin Gummies Adult Chew Chew 2 each by mouth daily.   omeprazole 20 MG capsule Commonly known as:  PRILOSEC TAKE 1 CAPSULE DAILY AS NEEDED (HEART BURN) What changed:    how much to take  how to take this  when to take this  reasons to take this  additional instructions   polyethylene glycol powder powder Commonly known as:  GLYCOLAX/MIRALAX Mix 17gm in 4oz of water.  Start with 2 doses per day,  but may take up to 6 doses per day.  Titrate number of doses to allow 2-3 soft bowel movements daily. What changed:    how much to take  how to take this  when to take this  additional instructions   Potassium Chloride ER 20 MEQ Tbcr Take 20 mEq by mouth daily.   Tylenol PM Extra Strength 25-500 MG Tabs tablet Generic drug:  diphenhydramine-acetaminophen Take 1 tablet by mouth at bedtime as needed (sleep).   Vitamin D (Ergocalciferol) 1.25 MG (50000 UT) Caps capsule Commonly known as:  DRISDOL Take 1 capsule (50,000 Units total) by mouth every 7 (seven) days.        Follow-up Information    [facility] Follow up in 4 week(s).   Specialty:  Neurology Why:  stroke clinic. office will call with appt date and time. Contact information: [address] [place] [phone]       Seward Carol, MD. Schedule an appointment as soon as possible for a visit in 1 week(s).   Specialty:  Internal Medicine Contact information: [address] [place]  [address] [phone]        Nigel Mormon, MD Follow up on 06/23/2018.   Specialties:  Cardiology, Radiology Why:  Appointment is on 06/23/2018 at [facility] information: [place] [address] [phone]           TOTAL DISCHARGE TIME: 63 minutes  Bonnielee Haff  [facility] Pager on [email or web address]  06/09/2018, 1:11 PM

## 2018-06-09 NOTE — Progress Notes (Signed)
STROKE TEAM PROGRESS NOTE   HISTORY PRESENT ILLNESS Her daughter is at the bedside.  She is doing better and plans to go home today.   REVIEW OF SYSTEMS R sided weakness. Speech difficulty.       Past Medical History:  Diagnosis Date  . Constipation   . Diabetes mellitus   . Gout   . Hyperlipidemia   . Hypertension    Past Surgical History:  Procedure Laterality Date  . ABDOMINAL HYSTERECTOMY    . d &c    . DILATION AND CURETTAGE OF UTERUS    . EYE SURGERY     bilateral cataract  . TUBAL LIGATION     .         Vitals:   06/08/18 0300 06/08/18 0400 06/08/18 0500 06/08/18 0745  BP: (!) 128/111 (!) 197/88 (!) 174/98 (!) 179/73  Pulse: 77 77 66 73  Resp: 14 17 17 18   Temp:  98.8 F (37.1 C)  97.8 F (36.6 C)  TempSrc:  Oral  Oral  SpO2: 99% 99% 98% 98%  Weight:      Height:        CBC:  LastLabs      Recent Labs  Lab 06/07/18 1622 06/08/18 0509  WBC 7.4 8.3  NEUTROABS 4.6  --   HGB 12.6 12.9  HCT 39.9 37.8  MCV 89.5 84.6  PLT 231 235      Basic Metabolic Panel:  LastLabs       Recent Labs  Lab 06/07/18 1622 06/07/18 1630 06/08/18 0509  NA 139  --  139  K 3.5  --  3.0*  CL 101  --  105  CO2 29  --  28  GLUCOSE 167*  --  132*  BUN 19  --  10  CREATININE 0.74 0.80 0.65  CALCIUM 9.6  --  9.6     Lipid Panel:  Labs(Brief)          Component Value Date/Time   CHOL 241 (H) 06/08/2018 0509   CHOL 245 (H) 03/30/2017 1052   TRIG 167 (H) 06/08/2018 0509   HDL 53 06/08/2018 0509   HDL 61 03/30/2017 1052   CHOLHDL 4.5 06/08/2018 0509   VLDL 33 06/08/2018 0509   LDLCALC 155 (H) 06/08/2018 0509   LDLCALC 154 (H) 03/30/2017 1052     HgbA1c:  RecentLabs       Lab Results  Component Value Date   HGBA1C 6.7 (H) 06/08/2018     Urine Drug Screen:  Labs(Brief)          Component Value Date/Time   LABOPIA NONE DETECTED 06/07/2018 1720   COCAINSCRNUR NONE DETECTED 06/07/2018  1720   LABBENZ NONE DETECTED 06/07/2018 1720   AMPHETMU NONE DETECTED 06/07/2018 1720   THCU NONE DETECTED 06/07/2018 1720   LABBARB NONE DETECTED 06/07/2018 1720      Alcohol Level  Labs(Brief)          Component Value Date/Time   ETH <10 06/07/2018 1622      IMAGING  ImagingResults(Last48hours)  Ct Head Wo Contrast  Result Date: 06/07/2018 CLINICAL DATA:  Altered speech, dizzy EXAM: CT HEAD WITHOUT CONTRAST TECHNIQUE: Contiguous axial images were obtained from the base of the skull through the vertex without intravenous contrast. COMPARISON:  CT 08/16/2014 FINDINGS: Brain: No evidence of acute infarction, hemorrhage, hydrocephalus, extra-axial collection or mass lesion/mass effect. Vascular: No hyperdense vessels.  Carotid vascular calcification Skull: Normal. Negative for fracture or focal lesion. Sinuses/Orbits: Mucosal thickening in  the ethmoid sinuses. Mucous retention cyst left maxillary sinus Other: None IMPRESSION: Negative non contrasted CT appearance of the brain Electronically Signed   By: Donavan Foil M.D.   On: 06/07/2018 17:01   Mr Jodene Nam Head Wo Contrast  Result Date: 06/07/2018 CLINICAL DATA:  Initial evaluation for acute dizziness, word-finding difficulty. EXAM: MRI HEAD WITHOUT CONTRAST MRA HEAD WITHOUT CONTRAST MRA NECK WITHOUT AND WITH CONTRAST TECHNIQUE: Multiplanar, multiecho pulse sequences of the brain and surrounding structures were obtained without intravenous contrast. Angiographic images of the Circle of Willis were obtained using MRA technique without intravenous contrast. Angiographic images of the neck were obtained using MRA technique without and with intravenous contrast. Carotid stenosis measurements (when applicable) are obtained utilizing NASCET criteria, using the distal internal carotid diameter as the denominator. CONTRAST:  7 cc of Gadavist. COMPARISON:  Prior CT from earlier the same day. FINDINGS: MRI HEAD FINDINGS Cerebral volume  within normal limits. Mild chronic micro vessel ischemic disease present within the periventricular deep white matter both cerebral hemispheres. Approximate 2 cm focus of patchy diffusion abnormality present within the parasagittal left frontal lobe, compatible with acute left ACA territory infarct (series 4, image 36). No associated mass effect or hemorrhage. No other evidence for acute or subacute ischemia. Gray-white matter differentiation otherwise maintained. No acute intracranial hemorrhage. Few scattered chronic micro hemorrhages noted within the brain, likely related to small vessel disease and/or hypertension. No mass lesion, midline shift or mass effect. No hydrocephalus. No extra-axial fluid collection. Pituitary gland suprasellar region normal. Midline structures intact. Major intracranial vascular flow voids maintained. Craniocervical junction normal. Bone marrow signal intensity within normal limits. Hyperostosis frontalis interna noted. Scalp soft tissues unremarkable. Patient status post bilateral ocular lens replacement. Scattered mucosal thickening throughout the paranasal sinuses with superimposed left maxillary sinus retention cyst. Paranasal sinuses are otherwise clear. No mastoid effusion. Inner ear structures normal. MRA HEAD FINDINGS ANTERIOR CIRCULATION: Distal cervical segments of the internal carotid arteries are widely patent with antegrade flow. Petrous segments widely patent. Atheromatous change within the cavernous/supraclinoid ICAs bilaterally, right worse than left. Resultant moderate to severe stenosis at the supraclinoid right ICA (series 13, image 133). Focal moderate to severe stenosis at the anterior genu of the cavernous left ICA (series 13, image 133). ICA termini widely patent. Left A1 patent. Right A1 hypoplastic and/or absent. Normal anterior communicating artery. Anterior cerebral arteries patent proximally. Left ACA attenuated distally, and may be partially occluded  (series 13, image 217). M1 segments widely patent. Normal MCA bifurcations. Distal MCA branches well perfused and symmetric. POSTERIOR CIRCULATION: Vertebral arteries patent to the vertebrobasilar junction without stenosis. Right vertebral artery dominant. Right PICA patent. Left PICA not seen. Basilar widely patent to its distal aspect. Superior cerebral arteries patent bilaterally. Left PCA widely patent to its distal aspect. Focal moderate proximal right P2 stenosis. Right PCA otherwise widely patent. Small bilateral posterior communicating arteries noted. No intracranial aneurysm. MRA NECK FINDINGS Source images reviewed. Patent antegrade flow seen within the carotid and vertebral arteries bilaterally on time-of-flight images. Visualized aortic arch of normal caliber. Probable aberrant right subclavian artery. No flow-limiting stenosis about the origin of the great vessels. Visualized subclavian arteries widely patent. Right common carotid artery arises directly from the aortic arch. Right CCA patent to the bifurcation without high-grade stenosis. Atheromatous stenosis at the origin of the right ICA measuring up to approximately 70% by NASCET criteria. Right ICA otherwise widely patent distally to the skull base. Left common carotid artery widely patent from its origin to  the bifurcation. Mild atheromatous narrowing about the left bifurcation/proximal left ICA with relatively mild no more than 20-25% narrowing. Left ICA widely patent distally to the skull base without stenosis or occlusion. Both of the vertebral arteries arise from the subclavian arteries. Right vertebral artery dominant. Vertebral arteries patent within the neck without flow-limiting stenosis or occlusion. IMPRESSION: MRI HEAD IMPRESSION: 1. Approximate 2 cm patchy acute ischemic nonhemorrhagic left ACA territory infarct involving the parasagittal left frontal lobe. 2. Underlying age-related cerebral atrophy with mild chronic microvascular  ischemic disease. MRA HEAD IMPRESSION: 1. Attenuation of the distal left ACA with loss of flow related signal, likely occluded given the acute ACA infarct. 2. Atheromatous plaque involving the carotid siphons bilaterally with resultant moderate to severe multifocal stenoses as above. 3. Focal moderate proximal right P2 stenosis. 4. Dominant left A1 segment, with the anterior cerebral arteries primarily supplied via the left carotid artery system. MRA NECK IMPRESSION: 1. Approximate 70% atheromatous stenosis at the origin of the right ICA. 2. Relatively mild no more than 20-25% narrowing at the origin of the left ICA. No other flow-limiting stenosis within the left carotid artery system. 3. Widely patent vertebral arteries within the neck. Right vertebral artery is dominant. 4. Aberrant right subclavian artery. Electronically Signed   By: Jeannine Boga M.D.   On: 06/07/2018 21:59   Mr Angiogram Neck W Or Wo Contrast  Result Date: 06/07/2018 CLINICAL DATA:  Initial evaluation for acute dizziness, word-finding difficulty. EXAM: MRI HEAD WITHOUT CONTRAST MRA HEAD WITHOUT CONTRAST MRA NECK WITHOUT AND WITH CONTRAST TECHNIQUE: Multiplanar, multiecho pulse sequences of the brain and surrounding structures were obtained without intravenous contrast. Angiographic images of the Circle of Willis were obtained using MRA technique without intravenous contrast. Angiographic images of the neck were obtained using MRA technique without and with intravenous contrast. Carotid stenosis measurements (when applicable) are obtained utilizing NASCET criteria, using the distal internal carotid diameter as the denominator. CONTRAST:  7 cc of Gadavist. COMPARISON:  Prior CT from earlier the same day. FINDINGS: MRI HEAD FINDINGS Cerebral volume within normal limits. Mild chronic micro vessel ischemic disease present within the periventricular deep white matter both cerebral hemispheres. Approximate 2 cm focus of patchy diffusion  abnormality present within the parasagittal left frontal lobe, compatible with acute left ACA territory infarct (series 4, image 36). No associated mass effect or hemorrhage. No other evidence for acute or subacute ischemia. Gray-white matter differentiation otherwise maintained. No acute intracranial hemorrhage. Few scattered chronic micro hemorrhages noted within the brain, likely related to small vessel disease and/or hypertension. No mass lesion, midline shift or mass effect. No hydrocephalus. No extra-axial fluid collection. Pituitary gland suprasellar region normal. Midline structures intact. Major intracranial vascular flow voids maintained. Craniocervical junction normal. Bone marrow signal intensity within normal limits. Hyperostosis frontalis interna noted. Scalp soft tissues unremarkable. Patient status post bilateral ocular lens replacement. Scattered mucosal thickening throughout the paranasal sinuses with superimposed left maxillary sinus retention cyst. Paranasal sinuses are otherwise clear. No mastoid effusion. Inner ear structures normal. MRA HEAD FINDINGS ANTERIOR CIRCULATION: Distal cervical segments of the internal carotid arteries are widely patent with antegrade flow. Petrous segments widely patent. Atheromatous change within the cavernous/supraclinoid ICAs bilaterally, right worse than left. Resultant moderate to severe stenosis at the supraclinoid right ICA (series 13, image 133). Focal moderate to severe stenosis at the anterior genu of the cavernous left ICA (series 13, image 133). ICA termini widely patent. Left A1 patent. Right A1 hypoplastic and/or absent. Normal anterior communicating artery. Anterior  cerebral arteries patent proximally. Left ACA attenuated distally, and may be partially occluded (series 13, image 217). M1 segments widely patent. Normal MCA bifurcations. Distal MCA branches well perfused and symmetric. POSTERIOR CIRCULATION: Vertebral arteries patent to the  vertebrobasilar junction without stenosis. Right vertebral artery dominant. Right PICA patent. Left PICA not seen. Basilar widely patent to its distal aspect. Superior cerebral arteries patent bilaterally. Left PCA widely patent to its distal aspect. Focal moderate proximal right P2 stenosis. Right PCA otherwise widely patent. Small bilateral posterior communicating arteries noted. No intracranial aneurysm. MRA NECK FINDINGS Source images reviewed. Patent antegrade flow seen within the carotid and vertebral arteries bilaterally on time-of-flight images. Visualized aortic arch of normal caliber. Probable aberrant right subclavian artery. No flow-limiting stenosis about the origin of the great vessels. Visualized subclavian arteries widely patent. Right common carotid artery arises directly from the aortic arch. Right CCA patent to the bifurcation without high-grade stenosis. Atheromatous stenosis at the origin of the right ICA measuring up to approximately 70% by NASCET criteria. Right ICA otherwise widely patent distally to the skull base. Left common carotid artery widely patent from its origin to the bifurcation. Mild atheromatous narrowing about the left bifurcation/proximal left ICA with relatively mild no more than 20-25% narrowing. Left ICA widely patent distally to the skull base without stenosis or occlusion. Both of the vertebral arteries arise from the subclavian arteries. Right vertebral artery dominant. Vertebral arteries patent within the neck without flow-limiting stenosis or occlusion. IMPRESSION: MRI HEAD IMPRESSION: 1. Approximate 2 cm patchy acute ischemic nonhemorrhagic left ACA territory infarct involving the parasagittal left frontal lobe. 2. Underlying age-related cerebral atrophy with mild chronic microvascular ischemic disease. MRA HEAD IMPRESSION: 1. Attenuation of the distal left ACA with loss of flow related signal, likely occluded given the acute ACA infarct. 2. Atheromatous plaque  involving the carotid siphons bilaterally with resultant moderate to severe multifocal stenoses as above. 3. Focal moderate proximal right P2 stenosis. 4. Dominant left A1 segment, with the anterior cerebral arteries primarily supplied via the left carotid artery system. MRA NECK IMPRESSION: 1. Approximate 70% atheromatous stenosis at the origin of the right ICA. 2. Relatively mild no more than 20-25% narrowing at the origin of the left ICA. No other flow-limiting stenosis within the left carotid artery system. 3. Widely patent vertebral arteries within the neck. Right vertebral artery is dominant. 4. Aberrant right subclavian artery. Electronically Signed   By: Jeannine Boga M.D.   On: 06/07/2018 21:59   Mr Brain Wo Contrast  Result Date: 06/07/2018 CLINICAL DATA:  Initial evaluation for acute dizziness, word-finding difficulty. EXAM: MRI HEAD WITHOUT CONTRAST MRA HEAD WITHOUT CONTRAST MRA NECK WITHOUT AND WITH CONTRAST TECHNIQUE: Multiplanar, multiecho pulse sequences of the brain and surrounding structures were obtained without intravenous contrast. Angiographic images of the Circle of Willis were obtained using MRA technique without intravenous contrast. Angiographic images of the neck were obtained using MRA technique without and with intravenous contrast. Carotid stenosis measurements (when applicable) are obtained utilizing NASCET criteria, using the distal internal carotid diameter as the denominator. CONTRAST:  7 cc of Gadavist. COMPARISON:  Prior CT from earlier the same day. FINDINGS: MRI HEAD FINDINGS Cerebral volume within normal limits. Mild chronic micro vessel ischemic disease present within the periventricular deep white matter both cerebral hemispheres. Approximate 2 cm focus of patchy diffusion abnormality present within the parasagittal left frontal lobe, compatible with acute left ACA territory infarct (series 4, image 36). No associated mass effect or hemorrhage. No other evidence  for acute or subacute ischemia. Gray-white matter differentiation otherwise maintained. No acute intracranial hemorrhage. Few scattered chronic micro hemorrhages noted within the brain, likely related to small vessel disease and/or hypertension. No mass lesion, midline shift or mass effect. No hydrocephalus. No extra-axial fluid collection. Pituitary gland suprasellar region normal. Midline structures intact. Major intracranial vascular flow voids maintained. Craniocervical junction normal. Bone marrow signal intensity within normal limits. Hyperostosis frontalis interna noted. Scalp soft tissues unremarkable. Patient status post bilateral ocular lens replacement. Scattered mucosal thickening throughout the paranasal sinuses with superimposed left maxillary sinus retention cyst. Paranasal sinuses are otherwise clear. No mastoid effusion. Inner ear structures normal. MRA HEAD FINDINGS ANTERIOR CIRCULATION: Distal cervical segments of the internal carotid arteries are widely patent with antegrade flow. Petrous segments widely patent. Atheromatous change within the cavernous/supraclinoid ICAs bilaterally, right worse than left. Resultant moderate to severe stenosis at the supraclinoid right ICA (series 13, image 133). Focal moderate to severe stenosis at the anterior genu of the cavernous left ICA (series 13, image 133). ICA termini widely patent. Left A1 patent. Right A1 hypoplastic and/or absent. Normal anterior communicating artery. Anterior cerebral arteries patent proximally. Left ACA attenuated distally, and may be partially occluded (series 13, image 217). M1 segments widely patent. Normal MCA bifurcations. Distal MCA branches well perfused and symmetric. POSTERIOR CIRCULATION: Vertebral arteries patent to the vertebrobasilar junction without stenosis. Right vertebral artery dominant. Right PICA patent. Left PICA not seen. Basilar widely patent to its distal aspect. Superior cerebral arteries patent bilaterally.  Left PCA widely patent to its distal aspect. Focal moderate proximal right P2 stenosis. Right PCA otherwise widely patent. Small bilateral posterior communicating arteries noted. No intracranial aneurysm. MRA NECK FINDINGS Source images reviewed. Patent antegrade flow seen within the carotid and vertebral arteries bilaterally on time-of-flight images. Visualized aortic arch of normal caliber. Probable aberrant right subclavian artery. No flow-limiting stenosis about the origin of the great vessels. Visualized subclavian arteries widely patent. Right common carotid artery arises directly from the aortic arch. Right CCA patent to the bifurcation without high-grade stenosis. Atheromatous stenosis at the origin of the right ICA measuring up to approximately 70% by NASCET criteria. Right ICA otherwise widely patent distally to the skull base. Left common carotid artery widely patent from its origin to the bifurcation. Mild atheromatous narrowing about the left bifurcation/proximal left ICA with relatively mild no more than 20-25% narrowing. Left ICA widely patent distally to the skull base without stenosis or occlusion. Both of the vertebral arteries arise from the subclavian arteries. Right vertebral artery dominant. Vertebral arteries patent within the neck without flow-limiting stenosis or occlusion. IMPRESSION: MRI HEAD IMPRESSION: 1. Approximate 2 cm patchy acute ischemic nonhemorrhagic left ACA territory infarct involving the parasagittal left frontal lobe. 2. Underlying age-related cerebral atrophy with mild chronic microvascular ischemic disease. MRA HEAD IMPRESSION: 1. Attenuation of the distal left ACA with loss of flow related signal, likely occluded given the acute ACA infarct. 2. Atheromatous plaque involving the carotid siphons bilaterally with resultant moderate to severe multifocal stenoses as above. 3. Focal moderate proximal right P2 stenosis. 4. Dominant left A1 segment, with the anterior cerebral  arteries primarily supplied via the left carotid artery system. MRA NECK IMPRESSION: 1. Approximate 70% atheromatous stenosis at the origin of the right ICA. 2. Relatively mild no more than 20-25% narrowing at the origin of the left ICA. No other flow-limiting stenosis within the left carotid artery system. 3. Widely patent vertebral arteries within the neck. Right vertebral artery is dominant. 4. Aberrant  right subclavian artery. Electronically Signed   By: Jeannine Boga M.D.   On: 06/07/2018 21:59   Ct Head Code Stroke Wo Contrast`  Result Date: 06/07/2018 CLINICAL DATA:  Code stroke.    Inability to speak 715 tonight EXAM: CT HEAD WITHOUT CONTRAST TECHNIQUE: Contiguous axial images were obtained from the base of the skull through the vertex without intravenous contrast. COMPARISON:  CT head 06/07/2018 FINDINGS: Brain: Ventricle size and cerebral volume normal. Mild hypodensity in the periventricular white matter appears chronic. Negative for acute infarct, hemorrhage, mass Vascular: Negative for hyperdense vessel. Atherosclerotic calcification. Skull: Negative Sinuses/Orbits: Mild mucosal edema paranasal sinuses. Bilateral cataract surgery Other: None ASPECTS ([place] Stroke Program Early CT Score) - Ganglionic level infarction (caudate, lentiform nuclei, internal capsule, insula, M1-M3 cortex): 7 - Supraganglionic infarction (M4-M6 cortex): 3 Total score (0-10 with 10 being normal): 10 IMPRESSION: 1. No acute intracranial abnormality. Mild chronic white matter disease 2. ASPECTS is 10 3. These results were called by telephone at the time of interpretation on 06/07/2018 at 7:52 pm to Dr. Dorie Rank , who verbally acknowledged these results. Electronically Signed   By: Franchot Gallo M.D.   On: 06/07/2018 19:53   Vas US Carotid (at [facility] Only)  Result Date: 06/08/2018 Carotid Arterial Duplex Study Indications: TIA. Performing Technologist: Oliver Hum RVT  Examination Guidelines: A  complete evaluation includes B-mode imaging, spectral Doppler, color Doppler, and power Doppler as needed of all accessible portions of each vessel. Bilateral testing is considered an integral part of a complete examination. Limited examinations for reoccurring indications may be performed as noted.  Right Carotid Findings: +----------+--------+-------+--------+--------------------------------+--------+           PSV cm/sEDV    StenosisDescribe                        Comments                   cm/s                                                    +----------+--------+-------+--------+--------------------------------+--------+ CCA Prox  64      10             smooth and heterogenous                  +----------+--------+-------+--------+--------------------------------+--------+ CCA Distal49      11             smooth and heterogenous                  +----------+--------+-------+--------+--------------------------------+--------+ ICA Prox  138     53     40-59%  smooth, heterogenous and                                                  calcific                                 +----------+--------+-------+--------+--------------------------------+--------+ ICA Distal47      16  tortuous +----------+--------+-------+--------+--------------------------------+--------+ ECA       58      6                                                       +----------+--------+-------+--------+--------------------------------+--------+ +----------+--------+-------+--------+-------------------+           PSV cm/sEDV cmsDescribeArm Pressure (mmHG) +----------+--------+-------+--------+-------------------+ [id number]                                         +----------+--------+-------+--------+-------------------+ +---------+--------+--+--------+--+---------+ VertebralPSV cm/s72EDV cm/s23Antegrade  +---------+--------+--+--------+--+---------+  Left Carotid Findings: +----------+--------+--------+--------+-----------------------+--------+           PSV cm/sEDV cm/sStenosisDescribe               Comments +----------+--------+--------+--------+-----------------------+--------+ CCA Prox  72      13              smooth and heterogenous         +----------+--------+--------+--------+-----------------------+--------+ CCA Distal54      12              smooth and heterogenous         +----------+--------+--------+--------+-----------------------+--------+ ICA Prox  72      21              smooth and heterogenoustortuous +----------+--------+--------+--------+-----------------------+--------+ ICA Distal47      17                                     tortuous +----------+--------+--------+--------+-----------------------+--------+ ECA       64      7                                               +----------+--------+--------+--------+-----------------------+--------+ +----------+--------+--------+--------+-------------------+ SubclavianPSV cm/sEDV cm/sDescribeArm Pressure (mmHG) +----------+--------+--------+--------+-------------------+           118                                         +----------+--------+--------+--------+-------------------+ +---------+--------+--+--------+-+---------+ VertebralPSV cm/s34EDV cm/s8Antegrade +---------+--------+--+--------+-+---------+  Summary: Right Carotid: Velocities in the right ICA are consistent with a 40-59%                stenosis. Left Carotid: Velocities in the left ICA are consistent with a 1-39% stenosis. Vertebrals: Bilateral vertebral arteries demonstrate antegrade flow. *See table(s) above for measurements and observations.     Preliminary     2D Echocardiogram  1. The left ventricle has normal systolic function with an ejection fraction of 60-65%. The cavity size was normal. There is mildly increased  left ventricular wall thickness. Left ventricular diastolic function could not be evaluated secondary to atrial fibrillation. 2. The right ventricle has normal systolic function. The cavity was normal. There is no increase in right ventricular wall thickness. 3. Left atrial size was severely dilated. 4. Right atrial size was mildly dilated. 5. The aortic root and ascending aorta are normal in size and structure. 6. The interatrial septum was not assessed.  PHYSICAL EXAM Pleasant frail elderly Caucasian lady not in distress.  She is slightly hard of hearing. . Afebrile. Head is nontraumatic. Neck is supple without bruit.    Cardiac exam no murmur or gallop. Lungs are clear to auscultation. Distal pulses are well felt.  Neurological Exam ;  Awake  Alert oriented x 3. Normal speech and language except for slight word hesitancy and nonfluent C.  No paraphasic errors.  Able to name and repeat and comprehend quite well..eye movements full without nystagmus.fundi were not visualized. Vision acuity and fields appear normal. Hearing is normal. Palatal movements are normal. Face asymmetric with slight right lower facial weakness when she smiles only.. Tongue midline. Normal strength, tone, reflexes and coordination except slightly diminished fine finger movements on the right.  Mild right grip weakness.  Orbits left over right upper extremity.. Normal sensation. Gait deferred.   ASSESSMENT/PLAN Ms. Bettejane MANA HABERL is a 85 y.o. female with history of DB, HTN, HLD, Fe deficiency anemia presenting with sudden onset aphasia and dizziness, also with mild R hand fine motor deficits.   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 Small vessel disease. ASPECTS 10.    MRI  34mm patchy L ACA parasagittal L frontal lobe infarct. Small vessel disease. Atrophy.   MRA head  L ACA loss of flow. B ICA siphon plaque. Focal  R P2 stenosis.   MRA neck R ICA 70% at origin. L ICA 20-25%.  2D Echo  EF 60-65%. No source of embolus   LDL 155  HgbA1c 6.7  IV heparin for VTE prophylaxis  No antithrombotic prior to admission, now on heparin IV. Recommend change to DOAC  Therapy recommendations:  HH PT, OT, SLP  Disposition:  pending   Follow-up Stroke Clinic at [facility] in 4 weeks. Office will call with appointment date and time. Order placed.  Atrial Fibrillation, new dx  Home anticoagulation:  none   Now on IV heparin  CHA2DS2-VASc Score = 8, ?2 oral anticoagulation recommended             Age in Years:  ?63   +2                        Sex:  Female   Female   +1                      Hypertension History: yes   +1                         Diabetes Mellitus:  yes   +1      Congestive Heart Failure History:  0             Vascular Disease History:  yes   +1                            Stroke/TIA/Thromboembolism History:  yes   +2  Recommend change to eliquis and Continue at discharge  Hypertension  Stable  Permissive hypertension (OK if < 220/120) but gradually normalize in 5-7 days  Long-term BP goal normotensive  Hyperlipidemia  Home meds:  No statin  LDL 155, goal < 70  Now on lipitor 40  Continue statin at discharge  Diabetes type II  HgbA1c 6.7, at goal <  7.0  Controlled  Other Stroke Risk Factors  Advanced age  Obesity, Body mass index is 32.78 kg/m., recommend weight loss, diet and exercise as appropriate   Other Active Problems  hypokalemia K 3.0   hypothyroidism   Hospital day # 0   She has presented with speech difficulty secondary to small left ACA embolic infarct from new onset atrial fibrillation.  She remains at risk for recurrent strokes and will benefit with long-term anticoagulation with Eliquis.  Continue   aggressive risk factor modification.  Long discussion with patient and daughter at the bedside and answered questions.   Discussed with Dr. [place] Pink.  Greater than 50% time during this 25-minute visit were spent on counseling and coordination of care about her embolic stroke, atrial fibrillation and discussion about stroke prevention and treatment and answering questions.Stroke team will sign off. Call for questions.  Antony Contras, MD Medical Director [facility] Pager: [phone] 06/08/2018 4:14 PM

## 2018-06-09 NOTE — Discharge Instructions (Signed)
Information on my medicine - ELIQUIS (apixaban)  This medication education was reviewed with me or my healthcare representative as part of my discharge preparation.   Why was Eliquis prescribed for you? Eliquis was prescribed for you to reduce the risk of a blood clot forming that can cause a stroke if you have a medical condition called atrial fibrillation (a type of irregular heartbeat).  What do You need to know about Eliquis ? Take your Eliquis TWICE DAILY - one tablet in the morning and one tablet in the evening with or without food. If you have difficulty swallowing the tablet whole please discuss with your pharmacist how to take the medication safely.  Take Eliquis exactly as prescribed by your doctor and DO NOT stop taking Eliquis without talking to the doctor who prescribed the medication.  Stopping may increase your risk of developing a stroke.  Refill your prescription before you run out.  After discharge, you should have regular check-up appointments with your healthcare provider that is prescribing your Eliquis.  In the future your dose may need to be changed if your kidney function or weight changes by a significant amount or as you get older.  What do you do if you miss a dose? If you miss a dose, take it as soon as you remember on the same day and resume taking twice daily.  Do not take more than one dose of ELIQUIS at the same time to make up a missed dose.  Important Safety Information A possible side effect of Eliquis is bleeding. You should call your healthcare provider right away if you experience any of the following: ? Bleeding from an injury or your nose that does not stop. ? Unusual colored urine (red or dark brown) or unusual colored stools (red or black). ? Unusual bruising for unknown reasons. ? A serious fall or if you hit your head (even if there is no bleeding).  Some medicines may interact with Eliquis and might increase your risk of bleeding or  clotting while on Eliquis. To help avoid this, consult your healthcare provider or pharmacist prior to using any new prescription or non-prescription medications, including herbals, vitamins, non-steroidal anti-inflammatory drugs (NSAIDs) and supplements.  This website has more information on Eliquis (apixaban): http://www.eliquis.com/eliquis/home   Atrial Fibrillation  Atrial fibrillation is a type of heartbeat that is irregular or fast (rapid). If you have this condition, your heart beats without any order. This makes it hard for your heart to pump blood in a normal way. Having this condition gives you more risk for stroke, heart failure, and other heart problems. Atrial fibrillation may start all of a sudden and then stop on its own, or it may become a long-lasting problem. What are the causes? This condition may be caused by heart conditions, such as:  High blood pressure.  Heart failure.  Heart valve disease.  Heart surgery. Other causes include:  Pneumonia.  Obstructive sleep apnea.  Lung cancer.  Thyroid disease.  Drinking too much alcohol. Sometimes the cause is not known. What increases the risk? You are more likely to develop this condition if:  You smoke.  You are older.  You have diabetes.  You are overweight.  You have a family history of this condition.  You exercise often and hard. What are the signs or symptoms? Common symptoms of this condition include:  A feeling like your heart is beating very fast.  Chest pain.  Feeling short of breath.  Feeling light-headed or weak.  Getting tired easily. Follow these instructions at home: Medicines  Take over-the-counter and prescription medicines only as told by your doctor.  If your doctor gives you a blood-thinning medicine, take it exactly as told. Taking too much of it can cause bleeding. Taking too little of it does not protect you against clots. Clots can cause a stroke. Lifestyle       Do not use any tobacco products. These include cigarettes, chewing tobacco, and e-cigarettes. If you need help quitting, ask your doctor.  Do not drink alcohol.  Do not drink beverages that have caffeine. These include coffee, soda, and tea.  Follow diet instructions as told by your doctor.  Exercise regularly as told by your doctor. General instructions  If you have a condition that causes breathing to stop for a short period of time (apnea), treat it as told by your doctor.  Keep a healthy weight. Do not use diet pills unless your doctor says they are safe for you. Diet pills may make heart problems worse.  Keep all follow-up visits as told by your doctor. This is important. Contact a doctor if:  You notice a change in the speed, rhythm, or strength of your heartbeat.  You are taking a blood-thinning medicine and you see more bruising.  You get tired more easily when you move or exercise.  You have a sudden change in weight. Get help right away if:   You have pain in your chest or your belly (abdomen).  You have trouble breathing.  You have blood in your vomit, poop, or pee (urine).  You have any signs of a stroke. "BE FAST" is an easy way to remember the main warning signs: ? B - Balance. Signs are dizziness, sudden trouble walking, or loss of balance. ? E - Eyes. Signs are trouble seeing or a change in how you see. ? F - Face. Signs are sudden weakness or loss of feeling in the face, or the face or eyelid drooping on one side. ? A - Arms. Signs are weakness or loss of feeling in an arm. This happens suddenly and usually on one side of the body. ? S - Speech. Signs are sudden trouble speaking, slurred speech, or trouble understanding what people say. ? T - Time. Time to call emergency services. Write down what time symptoms started.  You have other signs of a stroke, such as: ? A sudden, very bad headache with no known cause. ? Feeling sick to your stomach  (nausea). ? Throwing up (vomiting). ? Jerky movements you cannot control (seizure). These symptoms may be an emergency. Do not wait to see if the symptoms will go away. Get medical help right away. Call your local emergency services (911 in the [place] Do not drive yourself to the hospital. Summary  Atrial fibrillation is a type of heartbeat that is irregular or fast (rapid).  You are at higher risk of this condition if you smoke, are older, have diabetes, or are overweight.  Follow your doctor's instructions about medicines, diet, exercise, and follow-up visits.  Get help right away if you think that you have signs of a stroke. This information is not intended to replace advice given to you by your health care provider. Make sure you discuss any questions you have with your health care provider. Document Released: 12/17/2007 Document Revised: 04/30/2017 Document Reviewed: 04/30/2017 Elsevier Interactive Patient Education  2019 [employer].

## 2018-06-09 NOTE — Progress Notes (Signed)
Pt and family given discharge instructions, education. Verbalized understanding and able to teach back. No new questions or concerns. IV and tele removed.  Staff to d/c pt in wheelchair. Family to transport home

## 2018-06-09 NOTE — Progress Notes (Signed)
Occupational Therapy Treatment Patient Details Name: Haley Wilcox MRN: [id number] DOB: 09-15-1931 Today's Date: 06/09/2018    History of present illness Pt is a 85 y/o female with PMH of diabetes, HTN, iron deficiency anemia, L eye decreased vision since childhood, presented to ER with sudden onset of aphasia. CT negative. MRI shows Approximate 2 cm patchy acute ischemic nonhemorrhagic left ACA territory infarct involving the parasagittal left frontal lobe.    OT comments  Pt progressing well towards established OT goals. Administered Pill Box Test to assess cognition and executive functioning. Pt passing test with one error placing two of the same "pills" in Saturday at dinner time. Pt presenting with organized thought, working memory, alternating attention despite multiple distraction in room, and problem solving. Discussed with pt and daughter her performance and both reporting they feel she has been improving with cognition. Educated on neuroplasticity and need for further therapy. Pt planning for dc today and continue to recommend dc to home with [place].     Follow Up Recommendations  Home health OT;Supervision/Assistance - 24 hour    Equipment Recommendations  3 in 1 bedside commode    Recommendations for Other Services PT consult(as ordered)    Precautions / Restrictions Precautions Precautions: Fall Restrictions Weight Bearing Restrictions: No       Mobility Bed Mobility Overal bed mobility: Needs Assistance Bed Mobility: Supine to Sit     Supine to sit: Supervision     General bed mobility comments: supervision for safety, no assist required  Transfers Overall transfer level: Needs assistance Equipment used: None Transfers: Sit to/from Stand Sit to Stand: Min guard;Supervision         General transfer comment: min guard for safety and balance, fading to supervision     Balance Overall balance assessment: Needs assistance Sitting-balance support: No  upper extremity supported;Feet supported Sitting balance-Leahy Scale: Good     Standing balance support: No upper extremity supported;During functional activity Standing balance-Leahy Scale: Fair Standing balance comment: supervision at sink for grooming, within base of support                           ADL either performed or assessed with clinical judgement   ADL Overall ADL's : Needs assistance/impaired     Grooming: Wash/dry hands;Wash/dry face;Oral care;Supervision/safety;Standing Grooming Details (indicate cue type and reason): cueing to locate items on R side, utilize R UE during functional tasks  Upper Body Bathing: Supervision/ safety;Standing   Lower Body Bathing: Min guard;Sit to/from stand   Upper Body Dressing : Supervision/safety;Sitting   Lower Body Dressing: Min guard;Sit to/from stand Lower Body Dressing Details (indicate cue type and reason): cueing for safety to remove underwear in sitting  Toilet Transfer: Min guard;Ambulation;Regular Toilet;Grab bars   Toileting- Clothing Manipulation and Hygiene: Min guard;Sit to/from stand       Functional mobility during ADLs: Min guard General ADL Comments: pt requires increased time for all tasks, limited R sided coordination and attention to UE/environment      Vision       Perception     Praxis      Cognition Arousal/Alertness: Awake/alert Behavior During Therapy: WFL for tasks assessed/performed Overall Cognitive Status: Impaired/Different from baseline Area of Impairment: Problem solving;Attention;Following commands                   Current Attention Level: Alternating   Following Commands: Follows multi-step commands with increased time   Awareness: Emergent Problem Solving: Slow  processing General Comments: Administered Pill Box Assessment and pt passing with 1 error placing two green pills in Saturday at dinner time. Feel this is possible due to FM deficits. However, pt not  noticing error.         Exercises     Shoulder Instructions       General Comments      Pertinent Vitals/ Pain       Pain Assessment: No/denies pain  Home Living                                          Prior Functioning/Environment              Frequency  Min 2X/week        Progress Toward Goals  OT Goals(current goals can now be found in the care plan section)     Acute Rehab OT Goals Patient Stated Goal: to get home  OT Goal Formulation: With patient Time For Goal Achievement: 06/22/18 Potential to Achieve Goals: Good ADL Goals Pt Will Perform Grooming: with modified independence;standing Pt Will Perform Lower Body Dressing: with modified independence;sit to/from stand Pt Will Transfer to Toilet: with modified independence;regular height toilet;ambulating Pt Will Perform Tub/Shower Transfer: Shower transfer;grab bars;3 in 1;ambulating;with modified independence Pt/caregiver will Perform Home Exercise Program: Increased strength;With theraputty;With written HEP provided  Plan      Co-evaluation                 AM-PAC OT "6 Clicks" Daily Activity     Outcome Measure   Help from another person eating meals?: A Little Help from another person taking care of personal grooming?: A Little Help from another person toileting, which includes using toliet, bedpan, or urinal?: A Little Help from another person bathing (including washing, rinsing, drying)?: A Little Help from another person to put on and taking off regular upper body clothing?: A Little Help from another person to put on and taking off regular lower body clothing?: A Little 6 Click Score: 18    End of Session Equipment Utilized During Treatment: Gait belt  OT Visit Diagnosis: Other symptoms and signs involving cognitive function;Other symptoms and signs involving the nervous system (R29.898)   Activity Tolerance Patient tolerated treatment well   Patient Left in  chair;with call bell/phone within reach;with family/visitor present   Nurse Communication Mobility status        Time: 7121-9758 OT Time Calculation (min): 28 min  Charges: OT General Charges $OT Visit: 1 Visit OT Treatments $Self Care/Home Management : 23-37 mins  [place], OTR/L Acute Rehab Pager: [phone] Office: [place] 06/09/2018, 12:59 PM

## 2018-06-10 DIAGNOSIS — E119 Type 2 diabetes mellitus without complications: Secondary | ICD-10-CM | POA: Diagnosis not present

## 2018-06-10 DIAGNOSIS — Z7984 Long term (current) use of oral hypoglycemic drugs: Secondary | ICD-10-CM | POA: Diagnosis not present

## 2018-06-10 DIAGNOSIS — I69320 Aphasia following cerebral infarction: Secondary | ICD-10-CM | POA: Diagnosis not present

## 2018-06-10 DIAGNOSIS — D509 Iron deficiency anemia, unspecified: Secondary | ICD-10-CM | POA: Diagnosis not present

## 2018-06-10 DIAGNOSIS — I10 Essential (primary) hypertension: Secondary | ICD-10-CM | POA: Diagnosis not present

## 2018-06-10 DIAGNOSIS — Z9181 History of falling: Secondary | ICD-10-CM | POA: Diagnosis not present

## 2018-06-10 DIAGNOSIS — M109 Gout, unspecified: Secondary | ICD-10-CM | POA: Diagnosis not present

## 2018-06-10 DIAGNOSIS — E559 Vitamin D deficiency, unspecified: Secondary | ICD-10-CM | POA: Diagnosis not present

## 2018-06-10 DIAGNOSIS — E785 Hyperlipidemia, unspecified: Secondary | ICD-10-CM | POA: Diagnosis not present

## 2018-06-10 DIAGNOSIS — Z7901 Long term (current) use of anticoagulants: Secondary | ICD-10-CM | POA: Diagnosis not present

## 2018-06-10 DIAGNOSIS — I4891 Unspecified atrial fibrillation: Secondary | ICD-10-CM | POA: Diagnosis not present

## 2018-06-13 ENCOUNTER — Other Ambulatory Visit: Payer: Self-pay

## 2018-06-13 NOTE — Patient Outreach (Signed)
[facility]) Care Management  06/13/2018  Haley Wilcox 1931-12-31 [id number]  EMMI: stroke red alert Referral date: 06/13/18 Referral reason: Been able to take every dose of medication?  No Insurance: Medicare Day # 1  Telephone call to patient regarding EMMI stroke red alert. HIPAA verified with patient. Explained reason for call. Patient states she is not having any problems with her medications. She states the first thing she did when she got home from the hospital was organize her medications. She states she feels confident that the has her medications organized and is taking them as prescribed. Patient states she is scheduled for a follow up with her primary MD on 06/16/18.  She states she has transportation to her appointments. Patient denies any new symptoms. RNCM discussed signs and symptoms of stroke with patient. Advised patient to call 911 for stroke like symptoms. Patient verbalized understanding.  RNCM gave patient 24 hour nurse advise line number. Patient denied any additional need or concerns at this time.   PLAN: RNCM will close patient due to patient being assessed and having no further needs.   Quinn Plowman RN,BSN,CCM [facility] Telephonic  [phone]

## 2018-06-14 DIAGNOSIS — D509 Iron deficiency anemia, unspecified: Secondary | ICD-10-CM | POA: Diagnosis not present

## 2018-06-14 DIAGNOSIS — E559 Vitamin D deficiency, unspecified: Secondary | ICD-10-CM | POA: Diagnosis not present

## 2018-06-14 DIAGNOSIS — E119 Type 2 diabetes mellitus without complications: Secondary | ICD-10-CM | POA: Diagnosis not present

## 2018-06-14 DIAGNOSIS — I69320 Aphasia following cerebral infarction: Secondary | ICD-10-CM | POA: Diagnosis not present

## 2018-06-14 DIAGNOSIS — I4891 Unspecified atrial fibrillation: Secondary | ICD-10-CM | POA: Diagnosis not present

## 2018-06-14 DIAGNOSIS — I10 Essential (primary) hypertension: Secondary | ICD-10-CM | POA: Diagnosis not present

## 2018-06-16 DIAGNOSIS — E7800 Pure hypercholesterolemia, unspecified: Secondary | ICD-10-CM | POA: Diagnosis not present

## 2018-06-16 DIAGNOSIS — E559 Vitamin D deficiency, unspecified: Secondary | ICD-10-CM | POA: Diagnosis not present

## 2018-06-16 DIAGNOSIS — E119 Type 2 diabetes mellitus without complications: Secondary | ICD-10-CM | POA: Diagnosis not present

## 2018-06-16 DIAGNOSIS — I10 Essential (primary) hypertension: Secondary | ICD-10-CM | POA: Diagnosis not present

## 2018-06-16 DIAGNOSIS — I69320 Aphasia following cerebral infarction: Secondary | ICD-10-CM | POA: Diagnosis not present

## 2018-06-16 DIAGNOSIS — E1129 Type 2 diabetes mellitus with other diabetic kidney complication: Secondary | ICD-10-CM | POA: Diagnosis not present

## 2018-06-16 DIAGNOSIS — I6310 Cerebral infarction due to embolism of unspecified precerebral artery: Secondary | ICD-10-CM | POA: Diagnosis not present

## 2018-06-16 DIAGNOSIS — E039 Hypothyroidism, unspecified: Secondary | ICD-10-CM | POA: Diagnosis not present

## 2018-06-16 DIAGNOSIS — I4891 Unspecified atrial fibrillation: Secondary | ICD-10-CM | POA: Diagnosis not present

## 2018-06-16 DIAGNOSIS — I480 Paroxysmal atrial fibrillation: Secondary | ICD-10-CM | POA: Diagnosis not present

## 2018-06-16 DIAGNOSIS — D509 Iron deficiency anemia, unspecified: Secondary | ICD-10-CM | POA: Diagnosis not present

## 2018-06-23 ENCOUNTER — Other Ambulatory Visit: Payer: Self-pay

## 2018-06-23 ENCOUNTER — Encounter: Payer: Self-pay | Admitting: Cardiology

## 2018-06-23 ENCOUNTER — Ambulatory Visit (INDEPENDENT_AMBULATORY_CARE_PROVIDER_SITE_OTHER): Payer: Medicare Other | Admitting: Cardiology

## 2018-06-23 VITALS — BP 148/85 | HR 66 | Ht 62.0 in | Wt 179.0 lb

## 2018-06-23 DIAGNOSIS — I6523 Occlusion and stenosis of bilateral carotid arteries: Secondary | ICD-10-CM

## 2018-06-23 DIAGNOSIS — I10 Essential (primary) hypertension: Secondary | ICD-10-CM | POA: Diagnosis not present

## 2018-06-23 DIAGNOSIS — I63422 Cerebral infarction due to embolism of left anterior cerebral artery: Secondary | ICD-10-CM | POA: Diagnosis not present

## 2018-06-23 DIAGNOSIS — E119 Type 2 diabetes mellitus without complications: Secondary | ICD-10-CM | POA: Diagnosis not present

## 2018-06-23 DIAGNOSIS — I4819 Other persistent atrial fibrillation: Secondary | ICD-10-CM

## 2018-06-23 MED ORDER — SPIRONOLACTONE 25 MG PO TABS: 25.0000 mg | ORAL_TABLET | Freq: Every day | ORAL | 3 refills | Status: DC

## 2018-06-23 NOTE — Progress Notes (Signed)
Subjective:   Haley Wilcox, female    DOB: Jun 11, 1931, 85 y.o.   MRN: [id number]   Chief complaint:  Atrial fibrillation  HPI  85 year old African-American female with hypertension, type 2 diabetes mellitus, hyperlipidemia, iron deficiency anemia, was admitted to [facility] in 05/2018 with sudden onset aphasia. Left ACA territory infarct was noted on MRI.  She also has 70% atheromatous stenosis at the origin of Rt ICA, and mil stenosis in left ICA.  Given new diagnosis of persistent atrial fibrillation, she was started on apixaban for anticoagulation.  Of note, her TSH was found to be mildly elevated at 6.8 with free T4 of 0.9.  Patient is here to establish cardiac care with [place] today. At baseline, patient lives independently, and performs all her ADL's by herself. She reports having made dinner for 24 people just a few days before her stroke. Her stroke presentation was aphasia, which has since resolved. She is still not back to her baseline functional capacity, but reports that she is feeling better.   Her blood pressure continues to stay suboptimal. She is currently on amlodipine 10 mg, coreg 12.5 mg bid, losartan-HCTZ 100-25 mg daily. She was startedon Lipitor 40 mg daily and Apixaban 5 mg bid in the hospital..   Past Medical History:  Diagnosis Date  . A-fib ([place])   . Constipation   . Diabetes mellitus   . Gout   . Hyperlipidemia   . Hypertension   . Stroke [facility])      Past Surgical History:  Procedure Laterality Date  . ABDOMINAL HYSTERECTOMY    . d &c    . DILATION AND CURETTAGE OF UTERUS    . EYE SURGERY     bilateral cataract  . TUBAL LIGATION       Social History   Socioeconomic History  . Marital status: Widowed    Spouse name: Not on file  . Number of children: 2  . Years of education: Not on file  . Highest education level: Not on file  Occupational History  . Occupation: retired  Scientific laboratory technician  . Financial resource strain: Not on file   . Food insecurity:    Worry: Not on file    Inability: Not on file  . Transportation needs:    Medical: Not on file    Non-medical: Not on file  Tobacco Use  . Smoking status: Never Smoker  . Smokeless tobacco: Never Used  Substance and Sexual Activity  . Alcohol use: No    Alcohol/week: 0.0 standard drinks  . Drug use: No  . Sexual activity: Yes  Lifestyle  . Physical activity:    Days per week: Not on file    Minutes per session: Not on file  . Stress: Not on file  Relationships  . Social connections:    Talks on phone: Not on file    Gets together: Not on file    Attends religious service: Not on file    Active member of club or organization: Not on file    Attends meetings of clubs or organizations: Not on file    Relationship status: Not on file  . Intimate partner violence:    Fear of current or ex partner: Not on file    Emotionally abused: Not on file    Physically abused: Not on file    Forced sexual activity: Not on file  Other Topics Concern  . Not on file  Social History Narrative  . Not on  file     Current Outpatient Medications on File Prior to Visit  Medication Sig Dispense Refill  . amLODipine (NORVASC) 10 MG tablet Take 10 mg by mouth every evening.     Marland Kitchen apixaban (ELIQUIS) 5 MG TABS tablet Take 1 tablet (5 mg total) by mouth 2 (two) times daily. 60 tablet 2  . atorvastatin (LIPITOR) 40 MG tablet Take 1 tablet (40 mg total) by mouth daily at 6 PM. 30 tablet 1  . carvedilol (COREG) 12.5 MG tablet Take 12.5 mg by mouth 2 (two) times daily.    Marland Kitchen COLCRYS 0.6 MG tablet Take 1 tablet (0.6 mg total) by mouth daily as needed. CHANGED TO AS NEEDED DUE TO POTENTIAL INTERACTION WITH STATIN MEDICATION    . diphenhydramine-acetaminophen (TYLENOL PM EXTRA STRENGTH) 25-500 MG TABS tablet Take 1 tablet by mouth at bedtime as needed (sleep).     Marland Kitchen doxazosin (CARDURA) 2 MG tablet Take 2 mg by mouth every evening.     Marland Kitchen levothyroxine (SYNTHROID, LEVOTHROID) 25 MCG  tablet Take 25 mcg by mouth daily.    Marland Kitchen losartan-hydrochlorothiazide (HYZAAR) 100-25 MG tablet Take 1 tablet by mouth daily. RESUME AFTER 3 DAYS    . metFORMIN (GLUCOPHAGE) 500 MG tablet TAKE 1 TABLET DAILY WITH BREAKFAST (Patient taking differently: Take 500 mg by mouth daily with breakfast. ) 90 tablet 2  . Multiple Vitamins-Minerals (MULTIVITAMIN GUMMIES ADULT) CHEW Chew 2 each by mouth daily.    Marland Kitchen omeprazole (PRILOSEC) 20 MG capsule TAKE 1 CAPSULE DAILY AS NEEDED (HEART BURN) (Patient taking differently: Take 20 mg by mouth daily as needed (heart burn). ) 30 capsule 0  . polyethylene glycol powder (GLYCOLAX/MIRALAX) powder Mix 17gm in 4oz of water.  Start with 2 doses per day, but may take up to 6 doses per day.  Titrate number of doses to allow 2-3 soft bowel movements daily. (Patient taking differently: Take 17 g by mouth daily. Mix 17gm in 4oz of water) 850 g 1  . Vitamin D, Ergocalciferol, (DRISDOL) 50000 units CAPS capsule Take 1 capsule (50,000 Units total) by mouth every 7 (seven) days. 4 capsule 0   No current facility-administered medications on file prior to visit.     Cardiovascular studies:  EKG 06/07/2018: Atrial fibrillation with controlled ventricular response.  Echocardiogram 06/08/2018:  1. The left ventricle has normal systolic function with an ejection fraction of 60-65%. The cavity size was normal. There is mildly increased left ventricular wall thickness. Left ventricular diastolic function could not be evaluated secondary to atrial  fibrillation.  2. The right ventricle has normal systolic function. The cavity was normal. There is no increase in right ventricular wall thickness.  3. Left atrial size was severely dilated.  4. Right atrial size was mildly dilated.  5. The aortic root and ascending aorta are normal in size and structure.  6. The interatrial septum was not assessed.  Recent labs: Results for Haley Wilcox, Haley Wilcox (MRN [id number]) as of 06/24/2018 11:20   Ref. Range 06/09/2018 77:41  BASIC METABOLIC PANEL Unknown Rpt (A)  Sodium Latest Ref Range: 135 - 145 mmol/L 139  Potassium Latest Ref Range: 3.5 - 5.1 mmol/L 3.5  Chloride Latest Ref Range: 98 - 111 mmol/L 102  CO2 Latest Ref Range: 22 - 32 mmol/L 25  Glucose Latest Ref Range: 70 - 99 mg/dL 126 (H)  BUN Latest Ref Range: 8 - 23 mg/dL 18  Creatinine Latest Ref Range: 0.44 - 1.00 mg/dL 0.80  Calcium Latest Ref Range: 8.9 -  10.3 mg/dL 9.7  Anion gap Latest Ref Range: 5 - 15  12  GFR, Est Non African American Latest Ref Range: >60 mL/min >60  GFR, Est African American Latest Ref Range: >60 mL/min >60   Results for Haley Wilcox, Haley Wilcox (MRN [id number]) as of 06/24/2018 11:20  Ref. Range 06/09/2018 39:76  BASIC METABOLIC PANEL Unknown Rpt (A)  Sodium Latest Ref Range: 135 - 145 mmol/L 139  Potassium Latest Ref Range: 3.5 - 5.1 mmol/L 3.5  Chloride Latest Ref Range: 98 - 111 mmol/L 102  CO2 Latest Ref Range: 22 - 32 mmol/L 25  Glucose Latest Ref Range: 70 - 99 mg/dL 126 (H)  BUN Latest Ref Range: 8 - 23 mg/dL 18  Creatinine Latest Ref Range: 0.44 - 1.00 mg/dL 0.80  Calcium Latest Ref Range: 8.9 - 10.3 mg/dL 9.7  Anion gap Latest Ref Range: 5 - 15  12  GFR, Est Non African American Latest Ref Range: >60 mL/min >60  GFR, Est African American Latest Ref Range: >60 mL/min >60    Results for Haley Wilcox, Haley Wilcox (MRN [id number]) as of 06/24/2018 11:20  Ref. Range 06/08/2018 05:09 06/09/2018 04:28  Glucose Latest Ref Range: 70 - 99 mg/dL 132 (H) 126 (H)  Hemoglobin A1C Latest Ref Range: 4.8 - 5.6 % 6.7 (H)   TSH Latest Ref Range: 0.350 - 4.500 uIU/mL  6.801 (H)  T4,Free(Direct) Latest Ref Range: 0.82 - 1.77 ng/dL  0.90    Review of Systems  Constitution: Negative for decreased appetite, malaise/fatigue, weight gain and weight loss.  HENT: Negative for congestion.   Eyes: Negative for visual disturbance.  Cardiovascular: Negative for chest pain, dyspnea on exertion, leg swelling, palpitations and  syncope.  Respiratory: Negative for shortness of breath.   Endocrine: Negative for cold intolerance.  Hematologic/Lymphatic: Does not bruise/bleed easily.  Skin: Negative for itching and rash.  Musculoskeletal: Negative for myalgias.  Gastrointestinal: Negative for abdominal pain, nausea and vomiting.  Genitourinary: Negative for dysuria.  Neurological: Negative for dizziness and weakness.  Psychiatric/Behavioral: The patient is not nervous/anxious.   All other systems reviewed and are negative.        Vitals:   06/23/18 1106  BP: (!) 148/85  Pulse: 66  SpO2: 97%    Objective:    Physical Exam  Constitutional: She is oriented to person, place, and time. She appears well-developed and well-nourished. No distress.  HENT:  Head: Normocephalic and atraumatic.  Eyes: Pupils are equal, round, and reactive to light. Conjunctivae are normal.  Neck: No JVD present.  Cardiovascular: Normal rate. An irregularly irregular rhythm present.  No murmur heard. Pulses:      Carotid pulses are on the right side with bruit and on the left side with bruit.      Dorsalis pedis pulses are 1+ on the right side and 1+ on the left side.       Posterior tibial pulses are 1+ on the right side and 1+ on the left side.  Pulmonary/Chest: Effort normal and breath sounds normal. She has no wheezes. She has no rales.  Abdominal: Soft. Bowel sounds are normal. There is no rebound.  Musculoskeletal:        General: No edema.  Lymphadenopathy:    She has no cervical adenopathy.  Neurological: She is alert and oriented to person, place, and time. No cranial nerve deficit.  Skin: Skin is warm and dry.  Psychiatric: She has a normal mood and affect.  Nursing note and vitals reviewed.  Assessment & Recommendations:   85 year old African-American female with hypertension, type 2 diabetes mellitus, hyperlipidemia, iron deficiency anemia, recent Lt ACA stroke 05/2018, persistent Afib, mild to mod carotid  stenosis.  1. Persistent atrial fibrillation Reasonable to continue rate control strategy. Continue coreg 12.5 mg bid.  CHA2DS2VASc score 7, annual stroke risk 11% Continue eliquis 5 mg bid  2. Resistant hypertension Currently on amlodipine 10 mg, carvedilol 12. mg bid, losartan-HCTZ 100-25 mg. Given her resting heart rate in 60s in Afib, I would not increase her Carevedilol.  Added spironolactone 25 mg daily. Check BMP in week.   3. Cerebrovascular accident (CVA) due to embolism of left anterior cerebral artery (HCC) Minimal neurodeficit. She has 70% stenosis in Rt ICA, that needs medical management. Only mild stenosis on Lt. Continue Lipitor 409 mg. Repeat lipid panel in 3 months. I will discuss addition of Aspirin at next visit.   4. Carotid stenosis, asymptomatic, bilateral As above.  5. Type 2 diabetes mellitus without complication, without long-term current use of insulin ([place]) Follow up with PCP.   I will have a virtual visit follow up with her in 2 weeks.    Nigel Mormon, MD [facility] Pager: [phone] Office: [phone] If no answer Cell [phone]

## 2018-06-24 ENCOUNTER — Encounter: Payer: Self-pay | Admitting: Cardiology

## 2018-06-24 DIAGNOSIS — I6523 Occlusion and stenosis of bilateral carotid arteries: Secondary | ICD-10-CM | POA: Insufficient documentation

## 2018-06-24 DIAGNOSIS — I10 Essential (primary) hypertension: Secondary | ICD-10-CM | POA: Insufficient documentation

## 2018-06-30 DIAGNOSIS — I10 Essential (primary) hypertension: Secondary | ICD-10-CM | POA: Diagnosis not present

## 2018-06-30 DIAGNOSIS — I480 Paroxysmal atrial fibrillation: Secondary | ICD-10-CM | POA: Diagnosis not present

## 2018-06-30 DIAGNOSIS — I6310 Cerebral infarction due to embolism of unspecified precerebral artery: Secondary | ICD-10-CM | POA: Diagnosis not present

## 2018-07-08 ENCOUNTER — Encounter: Payer: Self-pay | Admitting: Cardiology

## 2018-07-08 ENCOUNTER — Ambulatory Visit (INDEPENDENT_AMBULATORY_CARE_PROVIDER_SITE_OTHER): Payer: Medicare Other | Admitting: Cardiology

## 2018-07-08 ENCOUNTER — Other Ambulatory Visit: Payer: Self-pay

## 2018-07-08 VITALS — BP 140/83 | HR 71 | Ht 63.0 in | Wt 175.0 lb

## 2018-07-08 DIAGNOSIS — I4819 Other persistent atrial fibrillation: Secondary | ICD-10-CM | POA: Diagnosis not present

## 2018-07-08 DIAGNOSIS — I63422 Cerebral infarction due to embolism of left anterior cerebral artery: Secondary | ICD-10-CM | POA: Diagnosis not present

## 2018-07-08 DIAGNOSIS — I10 Essential (primary) hypertension: Secondary | ICD-10-CM | POA: Diagnosis not present

## 2018-07-08 DIAGNOSIS — I6523 Occlusion and stenosis of bilateral carotid arteries: Secondary | ICD-10-CM

## 2018-07-08 DIAGNOSIS — E119 Type 2 diabetes mellitus without complications: Secondary | ICD-10-CM | POA: Diagnosis not present

## 2018-07-08 MED ORDER — SPIRONOLACTONE 25 MG PO TABS: 25.0000 mg | ORAL_TABLET | Freq: Every day | ORAL | 3 refills | Status: DC

## 2018-07-08 NOTE — Progress Notes (Signed)
Subjective:   Haley Wilcox, female    DOB: 1932-01-12, 85 y.o.   MRN: [id number]  I connected withthe patient on 04/17/20by a video enabled telemedicine application and verified that I am speaking with the correct person using two identifiers.    I discussed the limitations of evaluation and management by telemedicine and the availability of in person appointments. The patient expressed understanding and agreed to proceed.   This visit type was conducted due to national recommendations for restrictions regarding the COVID-19 Pandemic (e.g. social distancing). This format is felt to be most appropriate for this patient at this time. All issues noted in this document were discussed and addressed. No physical exam was performed (except for noted visual exam findings with Tele health visits). The patient has consented to conduct a Tele health visit and understands insurance will be billed.    Chief complaint:  Leg edema Hypertension  Chief complaint:  Atrial fibrillation   HPI  85 year old African-American female with hypertension, type 2 diabetes mellitus, hyperlipidemia, iron deficiency anemia, recent Lt ACA stroke 05/2018, persistent Afib, mild to mod carotid stenosis.  At last visit, I had continued her coreg 12.5 mg bid, amlodipine 10 mg daily, losartan-HCTZ 100-25 mg daily, and added spironolactone 25 mg daily. I also continued eliquis 5 mg bid and lipitor 40 mg daily. BMP was checked the next day, which showed acceptable Cr and K values.   Since her last visit, her BP is better. She had fatigue and myalgia, which has improved after her PCP switched her from Lipitor to Crestor. Overall, she is feeling well today.   Past Medical History:  Diagnosis Date  . A-fib ([place])   . Constipation   . Diabetes mellitus   . Gout   . Hyperlipidemia   . Hypertension   . Stroke [facility])      Past Surgical History:  Procedure Laterality Date  . ABDOMINAL HYSTERECTOMY    . d &c     . DILATION AND CURETTAGE OF UTERUS    . EYE SURGERY     bilateral cataract  . TUBAL LIGATION       Social History   Socioeconomic History  . Marital status: Widowed    Spouse name: Not on file  . Number of children: 2  . Years of education: Not on file  . Highest education level: Not on file  Occupational History  . Occupation: retired  Scientific laboratory technician  . Financial resource strain: Not on file  . Food insecurity:    Worry: Not on file    Inability: Not on file  . Transportation needs:    Medical: Not on file    Non-medical: Not on file  Tobacco Use  . Smoking status: Never Smoker  . Smokeless tobacco: Never Used  Substance and Sexual Activity  . Alcohol use: No    Alcohol/week: 0.0 standard drinks  . Drug use: No  . Sexual activity: Yes  Lifestyle  . Physical activity:    Days per week: Not on file    Minutes per session: Not on file  . Stress: Not on file  Relationships  . Social connections:    Talks on phone: Not on file    Gets together: Not on file    Attends religious service: Not on file    Active member of club or organization: Not on file    Attends meetings of clubs or organizations: Not on file    Relationship status: Not on file  .  Intimate partner violence:    Fear of current or ex partner: Not on file    Emotionally abused: Not on file    Physically abused: Not on file    Forced sexual activity: Not on file  Other Topics Concern  . Not on file  Social History Narrative  . Not on file     Current Outpatient Medications on File Prior to Visit  Medication Sig Dispense Refill  . amLODipine (NORVASC) 10 MG tablet Take 10 mg by mouth every evening.     Marland Kitchen apixaban (ELIQUIS) 5 MG TABS tablet Take 1 tablet (5 mg total) by mouth 2 (two) times daily. 60 tablet 2  . carvedilol (COREG) 12.5 MG tablet Take 12.5 mg by mouth 2 (two) times daily.    Marland Kitchen COLCRYS 0.6 MG tablet Take 1 tablet (0.6 mg total) by mouth daily as needed. CHANGED TO AS NEEDED DUE TO  POTENTIAL INTERACTION WITH STATIN MEDICATION    . diphenhydramine-acetaminophen (TYLENOL PM EXTRA STRENGTH) 25-500 MG TABS tablet Take 1 tablet by mouth at bedtime as needed (sleep).     Marland Kitchen doxazosin (CARDURA) 2 MG tablet Take 2 mg by mouth every evening.     Marland Kitchen levothyroxine (SYNTHROID, LEVOTHROID) 25 MCG tablet Take 25 mcg by mouth daily.    Marland Kitchen losartan-hydrochlorothiazide (HYZAAR) 100-25 MG tablet Take 1 tablet by mouth daily. RESUME AFTER 3 DAYS    . rosuvastatin (CRESTOR) 10 MG tablet Take 10 mg by mouth daily.    . metFORMIN (GLUCOPHAGE) 500 MG tablet TAKE 1 TABLET DAILY WITH BREAKFAST (Patient taking differently: Take 500 mg by mouth daily with breakfast. ) 90 tablet 2  . Multiple Vitamins-Minerals (MULTIVITAMIN GUMMIES ADULT) CHEW Chew 2 each by mouth daily.    Marland Kitchen omeprazole (PRILOSEC) 20 MG capsule TAKE 1 CAPSULE DAILY AS NEEDED (HEART BURN) (Patient taking differently: Take 20 mg by mouth daily as needed (heart burn). ) 30 capsule 0  . polyethylene glycol powder (GLYCOLAX/MIRALAX) powder Mix 17gm in 4oz of water.  Start with 2 doses per day, but may take up to 6 doses per day.  Titrate number of doses to allow 2-3 soft bowel movements daily. (Patient taking differently: Take 17 g by mouth daily. Mix 17gm in 4oz of water) 850 g 1  . spironolactone (ALDACTONE) 25 MG tablet Take 1 tablet (25 mg total) by mouth daily. 60 tablet 3   No current facility-administered medications on file prior to visit.     Cardiovascular studies:  EKG 06/07/2018: Atrial fibrillation with controlled ventricular response.  Echocardiogram 06/08/2018:  1. The left ventricle has normal systolic function with an ejection fraction of 60-65%. The cavity size was normal. There is mildly increased left ventricular wall thickness. Left ventricular diastolic function could not be evaluated secondary to atrial  fibrillation.  2. The right ventricle has normal systolic function. The cavity was normal. There is no increase in  right ventricular wall thickness.  3. Left atrial size was severely dilated.  4. Right atrial size was mildly dilated.  5. The aortic root and ascending aorta are normal in size and structure.  6. The interatrial septum was not assessed.  Recent labs: Results for BERMA, HARTS (MRN [id number]) as of 06/24/2018 11:20  Ref. Range 06/09/2018 25:05  BASIC METABOLIC PANEL Unknown Rpt (A)  Sodium Latest Ref Range: 135 - 145 mmol/L 139  Potassium Latest Ref Range: 3.5 - 5.1 mmol/L 3.5  Chloride Latest Ref Range: 98 - 111 mmol/L 102  CO2 Latest Ref  Range: 22 - 32 mmol/L 25  Glucose Latest Ref Range: 70 - 99 mg/dL 126 (H)  BUN Latest Ref Range: 8 - 23 mg/dL 18  Creatinine Latest Ref Range: 0.44 - 1.00 mg/dL 0.80  Calcium Latest Ref Range: 8.9 - 10.3 mg/dL 9.7  Anion gap Latest Ref Range: 5 - 15  12  GFR, Est Non African American Latest Ref Range: >60 mL/min >60  GFR, Est African American Latest Ref Range: >60 mL/min >60   Results for NATOSHIA, SOUTER (MRN [id number]) as of 06/24/2018 11:20  Ref. Range 06/09/2018 56:81  BASIC METABOLIC PANEL Unknown Rpt (A)  Sodium Latest Ref Range: 135 - 145 mmol/L 139  Potassium Latest Ref Range: 3.5 - 5.1 mmol/L 3.5  Chloride Latest Ref Range: 98 - 111 mmol/L 102  CO2 Latest Ref Range: 22 - 32 mmol/L 25  Glucose Latest Ref Range: 70 - 99 mg/dL 126 (H)  BUN Latest Ref Range: 8 - 23 mg/dL 18  Creatinine Latest Ref Range: 0.44 - 1.00 mg/dL 0.80  Calcium Latest Ref Range: 8.9 - 10.3 mg/dL 9.7  Anion gap Latest Ref Range: 5 - 15  12  GFR, Est Non African American Latest Ref Range: >60 mL/min >60  GFR, Est African American Latest Ref Range: >60 mL/min >60    Results for DAWNYA, GRAMS (MRN [id number]) as of 06/24/2018 11:20  Ref. Range 06/08/2018 05:09 06/09/2018 04:28  Glucose Latest Ref Range: 70 - 99 mg/dL 132 (H) 126 (H)  Hemoglobin A1C Latest Ref Range: 4.8 - 5.6 % 6.7 (H)   TSH Latest Ref Range: 0.350 - 4.500 uIU/mL  6.801 (H)  T4,Free(Direct)  Latest Ref Range: 0.82 - 1.77 ng/dL  0.90    Review of Systems  Constitution: Negative for decreased appetite, malaise/fatigue, weight gain and weight loss.  HENT: Negative for congestion.   Eyes: Negative for visual disturbance.  Cardiovascular: Negative for chest pain, dyspnea on exertion, leg swelling, palpitations and syncope.  Respiratory: Negative for shortness of breath.   Endocrine: Negative for cold intolerance.  Hematologic/Lymphatic: Does not bruise/bleed easily.  Skin: Negative for itching and rash.  Musculoskeletal: Negative for myalgias.  Gastrointestinal: Negative for abdominal pain, nausea and vomiting.  Genitourinary: Negative for dysuria.  Neurological: Negative for dizziness and weakness.  Psychiatric/Behavioral: The patient is not nervous/anxious.   All other systems reviewed and are negative.        Vitals:   07/08/18 0938  BP: 140/83  Pulse: 71    Objective:    Physical Exam  Constitutional: She is oriented to person, place, and time. She appears well-developed and well-nourished. No distress.  Neck: No JVD present.  Pulmonary/Chest: Effort normal.  Musculoskeletal:        General: No edema.  Neurological: She is alert and oriented to person, place, and time.  Psychiatric: She has a normal mood and affect.  Nursing note and vitals reviewed.      Assessment & Recommendations:   85 year old African-American female with hypertension, type 2 diabetes mellitus, hyperlipidemia, iron deficiency anemia, recent Lt ACA stroke 05/2018, persistent Afib, mild to mod carotid stenosis.  1. Persistent atrial fibrillation Reasonable to continue rate control strategy. Continue coreg 12.5 mg bid.  CHA2DS2VASc score 7, annual stroke risk 11% Continue eliquis 5 mg bid  2. Resistant hypertension Better controlled after starting spironolactone 25 mg daily.  Continue amlodipine 10 mg, carvedilol 12. mg bid, losartan-HCTZ 100-25 mg. Repeat BMP in 3 months  3.  Cerebrovascular accident (CVA) due to embolism of  left anterior cerebral artery (HCC) Minimal neurodeficit. She has 70% stenosis in Rt ICA, that needs medical management. Only mild stenosis on Lt. Continue Crestor  10 mg. Repeat lipid panel in 3 months. Not on aspirin given ongoing use of Xarelto for likely thromboembolic stroke  4. Carotid stenosis, asymptomatic, bilateral As above.  5. Type 2 diabetes mellitus without complication, without long-term current use of insulin ([place]) Follow up with PCP.   Follow up in 3 months.   Nigel Mormon, MD [facility] Pager: [phone] Office: [phone] If no answer Cell [phone]

## 2018-07-12 DIAGNOSIS — H00015 Hordeolum externum left lower eyelid: Secondary | ICD-10-CM | POA: Diagnosis not present

## 2018-07-12 DIAGNOSIS — H2513 Age-related nuclear cataract, bilateral: Secondary | ICD-10-CM | POA: Diagnosis not present

## 2018-07-20 ENCOUNTER — Telehealth: Payer: Self-pay

## 2018-07-20 NOTE — Telephone Encounter (Signed)
Spoke with the patient and she has given verbal consent to doing a doxy.me visit with Janett Billow on 07/25/2018 and to file her insurance.

## 2018-07-21 NOTE — Progress Notes (Signed)
[facility] [address] [place]. [place] [address] [phone]       VIRTUAL VISIT FOLLOW UP NOTE  Ms. Haley Wilcox Date of Birth:  1931-05-11 Medical Record Number:  [id number]   Reason for Referral:  hospital stroke follow up    Virtual Visit via Video Note  I connected with Haley Wilcox on 07/25/18 at 10:15 AM EDT by a video enabled telemedicine application located remotely in my own home and verified that I am speaking with the correct person using two identifiers who was located at their own home.   Visit scheduled by Maryelizabeth Kaufmann, [place]. She discussed the limitations of evaluation and management by telemedicine and the availability of in person appointments. The patient expressed understanding and agreed to proceed.Please see telephone note for additional scheduling information and consent.    CHIEF COMPLAINT:  Chief Complaint  Patient presents with  . Follow-up    hospital stroke follow up    HPI: Haley Wilcox was initially scheduled today for in office hospital follow-up regarding left ACA infarct embolic pattern secondary to new onset AF on 06/07/2018 but due to COVID-19 safety precautions, visit transition to telemedicine via [email or web address] with patients consent. History obtained from patient and chart review. Reviewed all radiology images and labs personally.  Ms.Daniell S Jacksonis a 85 y.o.femalewith history of DB, HTN, HLD, and Fe deficiency anemiawho presented with sudden onset aphasia and dizziness, also with mild R hand fine motor deficits. CT had reviewed and was negative for acute infarct.  MRI reviewed and showed 2 mm patchy left ACA parasagittal left frontal lobe infarct along with small vessel disease and atrophy.  MRA head showed left ACA loss of flow, bilateral ICA siphon plaque and focal right P2 stenosis.  MRA neck showed right ICA 70% stenosis at origin and left ICA 20 to 25%.  2D echo unremarkable.  LDL 155 and A1c 6.7.  New  diagnosis of atrial fibrillation recommended initiating Eliquis.  HTN stable.  Initiated atorvastatin 40 mg daily.  Continue to follow PCP for DM management.  She was discharged home in stable condition with residual deficit of decreased right hand dexterity.  She has been stable from a stroke standpoint with complete resolution of prior deficits.  She hs been able to return to doing all ADLs and IADLs independently. She does have her daughter who lives with her currently to help with any needed assistance such as grocery shopping. Feels fatigued since her stroke. Denies any improvement. No fatigue prior to stroke. Since husbands death almost 10 years ago, she has not slept well since that time.  Has been told she snores at night.  She does endorse being active within her church along with continuing cooking, cleaning and laundry within her home.  Continues on Eliquis without side effects of bleeding or bruising.  Experienced myalgias while on atorvastatin and therefore switched to rosuvastatin with resolution of myalgias.  Blood pressure check during visit at 143/81.  No further concerns at this time.  Denies new or worsening stroke/TIA symptoms.    ROS:   14 system review of systems performed and negative with exception of fatigue  PMH:  Past Medical History:  Diagnosis Date  . A-fib ([place])   . Constipation   . Diabetes mellitus   . Gout   . Hyperlipidemia   . Hypertension   . Stroke [facility])     PSH:  Past Surgical History:  Procedure Laterality Date  . ABDOMINAL HYSTERECTOMY    .  d &c    . DILATION AND CURETTAGE OF UTERUS    . EYE SURGERY     bilateral cataract  . TUBAL LIGATION      Social History:  Social History   Socioeconomic History  . Marital status: Widowed    Spouse name: Not on file  . Number of children: 2  . Years of education: Not on file  . Highest education level: Not on file  Occupational History  . Occupation: retired  Scientific laboratory technician  . Financial resource  strain: Not on file  . Food insecurity:    Worry: Not on file    Inability: Not on file  . Transportation needs:    Medical: Not on file    Non-medical: Not on file  Tobacco Use  . Smoking status: Never Smoker  . Smokeless tobacco: Never Used  Substance and Sexual Activity  . Alcohol use: No    Alcohol/week: 0.0 standard drinks  . Drug use: No  . Sexual activity: Yes  Lifestyle  . Physical activity:    Days per week: Not on file    Minutes per session: Not on file  . Stress: Not on file  Relationships  . Social connections:    Talks on phone: Not on file    Gets together: Not on file    Attends religious service: Not on file    Active member of club or organization: Not on file    Attends meetings of clubs or organizations: Not on file    Relationship status: Not on file  . Intimate partner violence:    Fear of current or ex partner: Not on file    Emotionally abused: Not on file    Physically abused: Not on file    Forced sexual activity: Not on file  Other Topics Concern  . Not on file  Social History Narrative  . Not on file    Family History:  Family History  Problem Relation Age of Onset  . Hypertension Mother   . Hypertension Sister   . Heart attack Sister   . Hypertension Brother   . Heart attack Brother     Medications:   Current Outpatient Medications on File Prior to Visit  Medication Sig Dispense Refill  . amLODipine (NORVASC) 10 MG tablet Take 10 mg by mouth every evening.     Marland Kitchen apixaban (ELIQUIS) 5 MG TABS tablet Take 1 tablet (5 mg total) by mouth 2 (two) times daily. 60 tablet 2  . carvedilol (COREG) 12.5 MG tablet Take 12.5 mg by mouth 2 (two) times daily.    Marland Kitchen COLCRYS 0.6 MG tablet Take 1 tablet (0.6 mg total) by mouth daily as needed. CHANGED TO AS NEEDED DUE TO POTENTIAL INTERACTION WITH STATIN MEDICATION    . diphenhydramine-acetaminophen (TYLENOL PM EXTRA STRENGTH) 25-500 MG TABS tablet Take 1 tablet by mouth at bedtime as needed (sleep).      Marland Kitchen doxazosin (CARDURA) 2 MG tablet Take 2 mg by mouth every evening.     Marland Kitchen levothyroxine (SYNTHROID, LEVOTHROID) 25 MCG tablet Take 25 mcg by mouth daily.    Marland Kitchen losartan-hydrochlorothiazide (HYZAAR) 100-25 MG tablet Take 1 tablet by mouth daily. RESUME AFTER 3 DAYS    . metFORMIN (GLUCOPHAGE) 500 MG tablet TAKE 1 TABLET DAILY WITH BREAKFAST (Patient taking differently: Take 500 mg by mouth daily with breakfast. ) 90 tablet 2  . Multiple Vitamins-Minerals (MULTIVITAMIN GUMMIES ADULT) CHEW Chew 2 each by mouth daily.    Marland Kitchen omeprazole (PRILOSEC)  20 MG capsule TAKE 1 CAPSULE DAILY AS NEEDED (HEART BURN) (Patient taking differently: Take 20 mg by mouth daily as needed (heart burn). ) 30 capsule 0  . polyethylene glycol powder (GLYCOLAX/MIRALAX) powder Mix 17gm in 4oz of water.  Start with 2 doses per day, but may take up to 6 doses per day.  Titrate number of doses to allow 2-3 soft bowel movements daily. (Patient taking differently: Take 17 g by mouth daily. Mix 17gm in 4oz of water) 850 g 1  . rosuvastatin (CRESTOR) 10 MG tablet Take 10 mg by mouth daily.    Marland Kitchen spironolactone (ALDACTONE) 25 MG tablet Take 1 tablet (25 mg total) by mouth daily. 90 tablet 3   No current facility-administered medications on file prior to visit.     Allergies:   Allergies  Allergen Reactions  . Fluoride Preparations Swelling    "Lips swell" when brushing teeth with toothpaste that has flouride   . Ivp Dye [Iodinated Diagnostic Agents] Rash     Physical Exam  Vitals:   07/25/18 1030  BP: (!) 143/81  Pulse: 68   There is no height or weight on file to calculate BMI. No exam data present  Depression screen [facility] 2/9 07/25/2018  Decreased Interest 0  Down, Depressed, Hopeless 0  PHQ - 2 Score 0  Altered sleeping -  Tired, decreased energy -  Change in appetite -  Feeling bad or failure about yourself  -  Trouble concentrating -  Moving slowly or fidgety/restless -  Suicidal thoughts -  PHQ-9 Score -      General: well developed, well nourished, pleasant elderly female, seated, in no evident distress Head: head normocephalic and atraumatic.     Neurologic Exam Mental Status: Awake and fully alert. Oriented to place and time. Recent and remote memory intact. Attention span, concentration and fund of knowledge appropriate. Mood and affect appropriate.  Cranial Nerves: Extraocular movements full without nystagmus.  Hearing diminished to voice.  Face, tongue, palate moves normally and symmetrically.  Shoulder shrug symmetric. Motor: No evidence of weakness per drift assessment. Sensory.:  UTA but denies numbness/tingling or temperature variation Coordination: Rapid alternating movements normal in all extremities. Finger-to-nose and heel-to-shin performed accurately bilaterally. Gait and Station: Arises from chair without difficulty. Stance is normal. Gait demonstrates normal stride length and balance. Reflexes: UTA   NIHSS  0 Modified Rankin  0 CHA2DS2-VASc 8 HAS-BLED 2   Diagnostic Data (Labs, Imaging, Testing)  CT HEAD WO CONTRAST 06/07/2018 IMPRESSION: Negative non contrasted CT appearance of the brain  MR BRAIN WO CONTRAST MR MRA HEAD  MR MRA NECK 06/07/2018 IMPRESSION: MRI HEAD IMPRESSION: 1. Approximate 2 cm patchy acute ischemic nonhemorrhagic left ACA territory infarct involving the parasagittal left frontal lobe. 2. Underlying age-related cerebral atrophy with mild chronic microvascular ischemic disease. MRA HEAD IMPRESSION: 1. Attenuation of the distal left ACA with loss of flow related signal, likely occluded given the acute ACA infarct. 2. Atheromatous plaque involving the carotid siphons bilaterally with resultant moderate to severe multifocal stenoses as above. 3. Focal moderate proximal right P2 stenosis. 4. Dominant left A1 segment, with the anterior cerebral arteries primarily supplied via the left carotid artery system. MRA NECK IMPRESSION: 1. Approximate 70%  atheromatous stenosis at the origin of the right ICA. 2. Relatively mild no more than 20-25% narrowing at the origin of the left ICA. No other flow-limiting stenosis within the left carotid artery system. 3. Widely patent vertebral arteries within the neck. Right vertebral artery is  dominant. 4. Aberrant right subclavian artery.  ECHOCARDIOGRAM 06/08/2018 IMPRESSIONS  1. The left ventricle has normal systolic function with an ejection fraction of 60-65%. The cavity size was normal. There is mildly increased left ventricular wall thickness. Left ventricular diastolic function could not be evaluated secondary to atrial  fibrillation.  2. The right ventricle has normal systolic function. The cavity was normal. There is no increase in right ventricular wall thickness.  3. Left atrial size was severely dilated.  4. Right atrial size was mildly dilated.  5. The aortic root and ascending aorta are normal in size and structure.  6. The interatrial septum was not assessed.    ASSESSMENT: Haley Wilcox is a 85 y.o. year old female here with left ACA infarct embolic pattern on 9/38/1017 secondary to new onset AF.  Initiated Eliquis.  Vascular risk factors include DM, HTN, HLD and new onset AF.  She has been stable from a stroke standpoint without residual deficits.    PLAN:  1. Left ACA infarct: Continue Eliquis (apixaban) daily  And rosuvastatin for secondary stroke prevention. Maintain strict control of hypertension with blood pressure goal below 130/90, diabetes with hemoglobin A1c goal below 6.5% and cholesterol with LDL cholesterol (bad cholesterol) goal below 70 mg/dL.  I also advised the patient to eat a healthy diet with plenty of whole grains, cereals, fruits and vegetables, exercise regularly with at least 30 minutes of continuous activity daily and maintain ideal body weight. 2. Atrial fibrillation: Continue Eliquis and ongoing follow-up with cardiology 3. HTN: Advised to continue  current treatment regimen.   Advised to continue to monitor at home along with continued follow-up with PCP for management 4. HLD: Advised to continue current treatment regimen along with continued follow-up with PCP for future prescribing and monitoring of lipid panel 5. DMII: Advised to continue to monitor glucose levels at home along with continued follow-up with PCP for management and monitoring 6. Fatigue: Likely secondary to post stroke fatigue and education provided.  It was also recommended for her to undergo sleep apnea testing in setting of this fatigue, underlying insomnia, history of snoring, recent stroke and new onset atrial fibrillation.  She was agreeable and referral placed to [place] [facility]    Follow up in 3 months or call earlier if needed   Greater than 50% of time during this 45 minute non-face-to-face visit was spent on counseling, explanation of diagnosis of left ACA infarct, reviewing risk factor management of atrial fibrillation, HTN, HLD and DM, planning of further management along with potential future management, and discussion with patient and family answering all questions.    Venancio Poisson, AGNP-BC  [facility] [address] [place], [place] [address]  Phone [phone] Fax [phone] Note: This document was prepared with digital dictation and possible smart phrase technology. Any transcriptional errors that result from this process are unintentional.

## 2018-07-25 ENCOUNTER — Other Ambulatory Visit: Payer: Self-pay

## 2018-07-25 ENCOUNTER — Encounter: Payer: Self-pay | Admitting: Adult Health

## 2018-07-25 ENCOUNTER — Ambulatory Visit (INDEPENDENT_AMBULATORY_CARE_PROVIDER_SITE_OTHER): Payer: Medicare Other | Admitting: Adult Health

## 2018-07-25 VITALS — BP 143/81 | HR 68

## 2018-07-25 DIAGNOSIS — I10 Essential (primary) hypertension: Secondary | ICD-10-CM | POA: Diagnosis not present

## 2018-07-25 DIAGNOSIS — I63422 Cerebral infarction due to embolism of left anterior cerebral artery: Secondary | ICD-10-CM | POA: Diagnosis not present

## 2018-07-25 DIAGNOSIS — E785 Hyperlipidemia, unspecified: Secondary | ICD-10-CM | POA: Diagnosis not present

## 2018-07-25 DIAGNOSIS — I4819 Other persistent atrial fibrillation: Secondary | ICD-10-CM | POA: Diagnosis not present

## 2018-07-25 DIAGNOSIS — E119 Type 2 diabetes mellitus without complications: Secondary | ICD-10-CM | POA: Diagnosis not present

## 2018-07-25 NOTE — Progress Notes (Signed)
I agree with the above plan 

## 2018-07-25 NOTE — Telephone Encounter (Signed)
E-mail has been sent to [email or web address] and text message has been sent to [phone]([employer]).

## 2018-09-21 DIAGNOSIS — I10 Essential (primary) hypertension: Secondary | ICD-10-CM | POA: Diagnosis not present

## 2018-09-22 LAB — BASIC METABOLIC PANEL
BUN/Creatinine Ratio: 24 (ref 12–28)
BUN: 23 mg/dL (ref 8–27)
CO2: 23 mmol/L (ref 20–29)
Calcium: 9.8 mg/dL (ref 8.7–10.3)
Chloride: 96 mmol/L (ref 96–106)
Creatinine, Ser: 0.97 mg/dL (ref 0.57–1.00)
GFR calc Af Amer: 62 mL/min/{1.73_m2} (ref >59)
GFR calc non Af Amer: 54 mL/min/{1.73_m2} — ABNORMAL LOW (ref >59)
Glucose: 115 mg/dL — ABNORMAL HIGH (ref 65–99)
Potassium: 4.8 mmol/L (ref 3.5–5.2)
Sodium: 134 mmol/L (ref 134–144)

## 2018-09-28 ENCOUNTER — Encounter: Payer: Self-pay | Admitting: Cardiology

## 2018-09-28 ENCOUNTER — Other Ambulatory Visit: Payer: Self-pay

## 2018-09-28 ENCOUNTER — Ambulatory Visit (INDEPENDENT_AMBULATORY_CARE_PROVIDER_SITE_OTHER): Payer: Medicare Other | Admitting: Cardiology

## 2018-09-28 VITALS — BP 115/72

## 2018-09-28 DIAGNOSIS — E039 Hypothyroidism, unspecified: Secondary | ICD-10-CM | POA: Diagnosis not present

## 2018-09-28 DIAGNOSIS — I6523 Occlusion and stenosis of bilateral carotid arteries: Secondary | ICD-10-CM

## 2018-09-28 DIAGNOSIS — I10 Essential (primary) hypertension: Secondary | ICD-10-CM

## 2018-09-28 DIAGNOSIS — I4819 Other persistent atrial fibrillation: Secondary | ICD-10-CM

## 2018-09-28 DIAGNOSIS — R5383 Other fatigue: Secondary | ICD-10-CM

## 2018-09-28 NOTE — Progress Notes (Signed)
Subjective:   Haley Wilcox, female    DOB: [birth year], 85 y.o.   MRN: [id number]  I connected withthe patient on 07/08/2020by a telephone call and verified that I am speaking with the correct person using two identifiers.    I offered the patient a video enabled application for a virtual visit. Unfortunately, this could not be accomplished due to technical difficulties/lack of video enabled phone/computer. I discussed the limitations of evaluation and management by telemedicine and the availability of in person appointments. The patient expressed understanding and agreed to proceed.   This visit type was conducted due to national recommendations for restrictions regarding the COVID-19 Pandemic (e.g. social distancing). This format is felt to be most appropriate for this patient at this time. All issues noted in this document were discussed and addressed. No physical exam was performed (except for noted visual exam findings with Tele health visits). The patient has consented to conduct a Tele health visit and understands insurance will be billed.   Chief complaint:  Leg edema Hypertension  Atrial fibrillation   HPI  85 year old African-American female with hypertension, type 2 diabetes mellitus, hyperlipidemia, iron deficiency anemia, recent Lt ACA stroke 05/2018, persistent Afib, mild to mod carotid stenosis.  Since her last visit, she has not had any chest pain, shortness of breath, leg edema, TIA/stroke symptoms. Her blood pressure has been staying BP 115-130/70-80 mmHg. This has coincided with patient experiencing generalized fatigue and night cramps. She denies any lightheadedness or dizziness symptoms.   Recent BMP reviewed with the patinet  Past Medical History:  Diagnosis Date  . A-fib ([place])   . Constipation   . Diabetes mellitus   . Gout   . Hyperlipidemia   . Hypertension   . Stroke [facility])      Past Surgical History:  Procedure Laterality Date  .  ABDOMINAL HYSTERECTOMY    . d &c    . DILATION AND CURETTAGE OF UTERUS    . EYE SURGERY     bilateral cataract  . TUBAL LIGATION       Social History   Socioeconomic History  . Marital status: Widowed    Spouse name: Not on file  . Number of children: 2  . Years of education: Not on file  . Highest education level: Not on file  Occupational History  . Occupation: retired  Scientific laboratory technician  . Financial resource strain: Not on file  . Food insecurity    Worry: Not on file    Inability: Not on file  . Transportation needs    Medical: Not on file    Non-medical: Not on file  Tobacco Use  . Smoking status: Never Smoker  . Smokeless tobacco: Never Used  Substance and Sexual Activity  . Alcohol use: No    Alcohol/week: 0.0 standard drinks  . Drug use: No  . Sexual activity: Yes  Lifestyle  . Physical activity    Days per week: Not on file    Minutes per session: Not on file  . Stress: Not on file  Relationships  . Social Herbalist on phone: Not on file    Gets together: Not on file    Attends religious service: Not on file    Active member of club or organization: Not on file    Attends meetings of clubs or organizations: Not on file    Relationship status: Not on file  . Intimate partner violence    Fear of current or  ex partner: Not on file    Emotionally abused: Not on file    Physically abused: Not on file    Forced sexual activity: Not on file  Other Topics Concern  . Not on file  Social History Narrative  . Not on file     Current Outpatient Medications on File Prior to Visit  Medication Sig Dispense Refill  . amLODipine (NORVASC) 10 MG tablet Take 10 mg by mouth every evening.     Marland Kitchen apixaban (ELIQUIS) 5 MG TABS tablet Take 1 tablet (5 mg total) by mouth 2 (two) times daily. 60 tablet 2  . carvedilol (COREG) 12.5 MG tablet Take 12.5 mg by mouth 2 (two) times daily.    Marland Kitchen COLCRYS 0.6 MG tablet Take 1 tablet (0.6 mg total) by mouth daily as needed.  CHANGED TO AS NEEDED DUE TO POTENTIAL INTERACTION WITH STATIN MEDICATION    . diphenhydramine-acetaminophen (TYLENOL PM EXTRA STRENGTH) 25-500 MG TABS tablet Take 1 tablet by mouth at bedtime as needed (sleep).     Marland Kitchen doxazosin (CARDURA) 2 MG tablet Take 2 mg by mouth every evening.     Marland Kitchen levothyroxine (SYNTHROID, LEVOTHROID) 25 MCG tablet Take 25 mcg by mouth daily.    Marland Kitchen losartan-hydrochlorothiazide (HYZAAR) 100-25 MG tablet Take 1 tablet by mouth daily. RESUME AFTER 3 DAYS    . metFORMIN (GLUCOPHAGE) 500 MG tablet TAKE 1 TABLET DAILY WITH BREAKFAST (Patient taking differently: Take 500 mg by mouth daily with breakfast. ) 90 tablet 2  . Multiple Vitamins-Minerals (MULTIVITAMIN GUMMIES ADULT) CHEW Chew 2 each by mouth daily.    Marland Kitchen omeprazole (PRILOSEC) 20 MG capsule TAKE 1 CAPSULE DAILY AS NEEDED (HEART BURN) (Patient taking differently: Take 20 mg by mouth daily as needed (heart burn). ) 30 capsule 0  . polyethylene glycol powder (GLYCOLAX/MIRALAX) powder Mix 17gm in 4oz of water.  Start with 2 doses per day, but may take up to 6 doses per day.  Titrate number of doses to allow 2-3 soft bowel movements daily. (Patient taking differently: Take 17 g by mouth daily. Mix 17gm in 4oz of water) 850 g 1  . rosuvastatin (CRESTOR) 10 MG tablet Take 10 mg by mouth daily.    Marland Kitchen spironolactone (ALDACTONE) 25 MG tablet Take 1 tablet (25 mg total) by mouth daily. 90 tablet 3   No current facility-administered medications on file prior to visit.     Cardiovascular studies:  EKG 06/07/2018: Atrial fibrillation with controlled ventricular response.  Echocardiogram 06/08/2018:  1. The left ventricle has normal systolic function with an ejection fraction of 60-65%. The cavity size was normal. There is mildly increased left ventricular wall thickness. Left ventricular diastolic function could not be evaluated secondary to atrial  fibrillation.  2. The right ventricle has normal systolic function. The cavity was  normal. There is no increase in right ventricular wall thickness.  3. Left atrial size was severely dilated.  4. Right atrial size was mildly dilated.  5. The aortic root and ascending aorta are normal in size and structure.  6. The interatrial septum was not assessed.  Recent labs: Results for DONYEL, CASTAGNOLA (MRN [id number]) as of 09/28/2018 09:38  Ref. Range 06/09/2018 04:28 09/21/2018 24:58  BASIC METABOLIC PANEL Unknown Rpt (A) Rpt (A)  Sodium Latest Ref Range: 134 - 144 mmol/L 139 134  Potassium Latest Ref Range: 3.5 - 5.2 mmol/L 3.5 4.8  Chloride Latest Ref Range: 96 - 106 mmol/L 102 96  CO2 Latest Ref Range: 20 -  29 mmol/L 25 23  Glucose Latest Ref Range: 65 - 99 mg/dL 126 (H) 115 (H)  BUN Latest Ref Range: 8 - 27 mg/dL 18 23  Creatinine Latest Ref Range: 0.57 - 1.00 mg/dL 0.80 0.97  Calcium Latest Ref Range: 8.7 - 10.3 mg/dL 9.7 9.8  Anion gap Latest Ref Range: 5 - 15  12   BUN/Creatinine Ratio Latest Ref Range: 12 - 28   24  GFR, Est Non African American Latest Ref Range: >59 mL/min/1.73 >60 54 (L)  GFR, Est African American Latest Ref Range: >59 mL/min/1.73 >60 62    Review of Systems  Constitution: Positive for malaise/fatigue. Negative for decreased appetite, weight gain and weight loss.  HENT: Negative for congestion.   Eyes: Negative for visual disturbance.  Cardiovascular: Negative for chest pain, dyspnea on exertion, leg swelling, palpitations and syncope.  Respiratory: Negative for shortness of breath.   Endocrine: Negative for cold intolerance.  Hematologic/Lymphatic: Does not bruise/bleed easily.  Skin: Negative for itching and rash.  Musculoskeletal: Negative for myalgias.  Gastrointestinal: Negative for abdominal pain, nausea and vomiting.  Genitourinary: Negative for dysuria.  Neurological: Negative for dizziness and weakness.  Psychiatric/Behavioral: The patient is not nervous/anxious.   All other systems reviewed and are negative.        Vitals:    09/28/18 1020  BP: 115/72    Objective:    Physical Exam Not performed. Telephone visit.      Assessment & Recommendations:   85 year old African-American female with hypertension, type 2 diabetes mellitus, hyperlipidemia, iron deficiency anemia, recent Lt ACA stroke 05/2018, persistent Afib, mild to mod carotid stenosis.  1. Fatigue: Generalized fatigue. Differentials include lower than usual blood pressure, hypothyroidism. Patient has had persistent Afib without any symptoms prior, so unlikely to be related to Afib. I have asked her to reduce amlodipine to 1/2 of 10 mg daily to see if this improves the symptoms. If no improvement noted, strongly encourage f/u w/PCP re: hypothyroidism.   2. Persistent atrial fibrillation Reasonable to continue rate control strategy. Continue coreg 12.5 mg bid.  CHA2DS2VASc score 7, annual stroke risk 11% Continue eliquis 5 mg bid  3.Hypertension Controlled. See discussion above.  4. Cerebrovascular accident (CVA) due to embolism of left anterior cerebral artery (HCC) Minimal neurodeficit. She has 70% stenosis in Rt ICA, that needs medical management. Only mild stenosis on Lt. Continue Crestor  10 mg. Not on aspirin given ongoing use of Xarelto for likely thromboembolic stroke  5. Carotid stenosis, asymptomatic, bilateral As above.  6. Type 2 diabetes mellitus without complication, without long-term current use of insulin ([place]) Follow up with PCP.  F/u w/me in 8 weeks. Call sooner if BP increases to >140/80 mmHg persistently.    Nigel Mormon, MD [facility] Pager: [phone] Office: [phone] If no answer Cell [phone]

## 2018-10-07 ENCOUNTER — Ambulatory Visit: Payer: Medicare Other | Admitting: Cardiology

## 2018-10-12 DIAGNOSIS — E039 Hypothyroidism, unspecified: Secondary | ICD-10-CM | POA: Diagnosis not present

## 2018-10-13 DIAGNOSIS — R5383 Other fatigue: Secondary | ICD-10-CM | POA: Diagnosis not present

## 2018-10-13 DIAGNOSIS — G479 Sleep disorder, unspecified: Secondary | ICD-10-CM | POA: Diagnosis not present

## 2018-10-19 ENCOUNTER — Telehealth: Payer: Self-pay | Admitting: Adult Health

## 2018-10-19 NOTE — Telephone Encounter (Signed)
I called patient to confirm her 8/6 appointment. Patient states that she does not wish to have a follow-up with [place] and does not understand why she has one. I advised patient that she has been seen at our office for stroke care. Patient states that she would like to cancel this appointment.

## 2018-10-27 ENCOUNTER — Ambulatory Visit: Payer: Medicare Other | Admitting: Adult Health

## 2018-11-23 ENCOUNTER — Other Ambulatory Visit: Payer: Self-pay

## 2018-11-23 ENCOUNTER — Telehealth (INDEPENDENT_AMBULATORY_CARE_PROVIDER_SITE_OTHER): Payer: Medicare Other | Admitting: Cardiology

## 2018-11-23 VITALS — BP 122/72 | HR 66

## 2018-11-23 DIAGNOSIS — I6523 Occlusion and stenosis of bilateral carotid arteries: Secondary | ICD-10-CM

## 2018-11-23 DIAGNOSIS — I4819 Other persistent atrial fibrillation: Secondary | ICD-10-CM | POA: Diagnosis not present

## 2018-11-23 DIAGNOSIS — I10 Essential (primary) hypertension: Secondary | ICD-10-CM | POA: Diagnosis not present

## 2018-11-23 MED ORDER — AMLODIPINE BESYLATE 5 MG PO TABS: 5.0000 mg | ORAL_TABLET | Freq: Every day | ORAL | 3 refills | Status: DC

## 2018-11-23 MED ORDER — LOSARTAN POTASSIUM 100 MG PO TABS: 100.0000 mg | ORAL_TABLET | Freq: Every day | ORAL | 3 refills | Status: AC

## 2018-11-23 NOTE — Progress Notes (Signed)
Subjective:   Haley Wilcox, female    DOB: 07-Apr-1931, 85 y.o.   MRN: [id number]  I connected withthe patient on 09/02/2020by a telephone call and verified that I am speaking with the correct person using two identifiers.    I offered the patient a video enabled application for a virtual visit. Unfortunately, this could not be accomplished due to technical difficulties/lack of video enabled phone/computer. I discussed the limitations of evaluation and management by telemedicine and the availability of in person appointments. The patient expressed understanding and agreed to proceed.   This visit type was conducted due to national recommendations for restrictions regarding the COVID-19 Pandemic (e.g. social distancing). This format is felt to be most appropriate for this patient at this time. All issues noted in this document were discussed and addressed. No physical exam was performed (except for noted visual exam findings with Tele health visits). The patient has consented to conduct a Tele health visit and understands insurance will be billed.   Chief complaint:  Leg edema Hypertension  Atrial fibrillation   HPI  85 year old African-American female with hypertension, type 2 diabetes mellitus, hyperlipidemia, iron deficiency anemia, recent Lt ACA stroke 05/2018, persistent Afib, mild to mod carotid stenosis.  Since her last visit, she has not had any chest pain, shortness of breath, leg edema, TIA/stroke symptoms. She is feeling much better with improvement in her fatigue. Her blood pressure still runs lower, around 110/70 mmHg.   Past Medical History:  Diagnosis Date  . A-fib ([place])   . Constipation   . Diabetes mellitus   . Gout   . Hyperlipidemia   . Hypertension   . Stroke [facility])      Past Surgical History:  Procedure Laterality Date  . ABDOMINAL HYSTERECTOMY    . d &c    . DILATION AND CURETTAGE OF UTERUS    . EYE SURGERY     bilateral cataract  . TUBAL  LIGATION       Social History   Socioeconomic History  . Marital status: Widowed    Spouse name: Not on file  . Number of children: 2  . Years of education: Not on file  . Highest education level: Not on file  Occupational History  . Occupation: retired  Scientific laboratory technician  . Financial resource strain: Not on file  . Food insecurity    Worry: Not on file    Inability: Not on file  . Transportation needs    Medical: Not on file    Non-medical: Not on file  Tobacco Use  . Smoking status: Never Smoker  . Smokeless tobacco: Never Used  Substance and Sexual Activity  . Alcohol use: No    Alcohol/week: 0.0 standard drinks  . Drug use: No  . Sexual activity: Yes  Lifestyle  . Physical activity    Days per week: Not on file    Minutes per session: Not on file  . Stress: Not on file  Relationships  . Social Herbalist on phone: Not on file    Gets together: Not on file    Attends religious service: Not on file    Active member of club or organization: Not on file    Attends meetings of clubs or organizations: Not on file    Relationship status: Not on file  . Intimate partner violence    Fear of current or ex partner: Not on file    Emotionally abused: Not on file  Physically abused: Not on file    Forced sexual activity: Not on file  Other Topics Concern  . Not on file  Social History Narrative  . Not on file     Current Outpatient Medications on File Prior to Visit  Medication Sig Dispense Refill  . amLODipine (NORVASC) 10 MG tablet Take 5 mg by mouth every evening.     Marland Kitchen apixaban (ELIQUIS) 5 MG TABS tablet Take 1 tablet (5 mg total) by mouth 2 (two) times daily. 60 tablet 2  . carvedilol (COREG) 12.5 MG tablet Take 12.5 mg by mouth 2 (two) times daily.    . diphenhydramine-acetaminophen (TYLENOL PM EXTRA STRENGTH) 25-500 MG TABS tablet Take 1 tablet by mouth at bedtime as needed (sleep).     Marland Kitchen doxazosin (CARDURA) 2 MG tablet Take 2 mg by mouth every  evening.     Marland Kitchen levothyroxine (SYNTHROID, LEVOTHROID) 25 MCG tablet Take 25 mcg by mouth daily.    Marland Kitchen losartan-hydrochlorothiazide (HYZAAR) 100-25 MG tablet Take 1 tablet by mouth daily. RESUME AFTER 3 DAYS    . metFORMIN (GLUCOPHAGE) 500 MG tablet TAKE 1 TABLET DAILY WITH BREAKFAST (Patient taking differently: Take 500 mg by mouth daily with breakfast. ) 90 tablet 2  . Multiple Vitamins-Minerals (MULTIVITAMIN GUMMIES ADULT) CHEW Chew 2 each by mouth daily.    Marland Kitchen omeprazole (PRILOSEC) 20 MG capsule TAKE 1 CAPSULE DAILY AS NEEDED (HEART BURN) (Patient taking differently: Take 20 mg by mouth daily as needed (heart burn). ) 30 capsule 0  . polyethylene glycol powder (GLYCOLAX/MIRALAX) powder Mix 17gm in 4oz of water.  Start with 2 doses per day, but may take up to 6 doses per day.  Titrate number of doses to allow 2-3 soft bowel movements daily. (Patient taking differently: Take 17 g by mouth daily. Mix 17gm in 4oz of water) 850 g 1  . rosuvastatin (CRESTOR) 10 MG tablet Take 10 mg by mouth daily.    Marland Kitchen spironolactone (ALDACTONE) 25 MG tablet Take 1 tablet (25 mg total) by mouth daily. 90 tablet 3   No current facility-administered medications on file prior to visit.     Cardiovascular studies:  EKG 06/07/2018: Atrial fibrillation with controlled ventricular response.  Echocardiogram 06/08/2018:  1. The left ventricle has normal systolic function with an ejection fraction of 60-65%. The cavity size was normal. There is mildly increased left ventricular wall thickness. Left ventricular diastolic function could not be evaluated secondary to atrial  fibrillation.  2. The right ventricle has normal systolic function. The cavity was normal. There is no increase in right ventricular wall thickness.  3. Left atrial size was severely dilated.  4. Right atrial size was mildly dilated.  5. The aortic root and ascending aorta are normal in size and structure.  6. The interatrial septum was not assessed.   Recent labs: Results for AMA, MCFERRIN (MRN [id number]) as of 09/28/2018 09:38  Ref. Range 06/09/2018 04:28 09/21/2018 [id number]  BASIC METABOLIC PANEL Unknown Rpt (A) Rpt (A)  Sodium Latest Ref Range: 134 - 144 mmol/L 139 134  Potassium Latest Ref Range: 3.5 - 5.2 mmol/L 3.5 4.8  Chloride Latest Ref Range: 96 - 106 mmol/L 102 96  CO2 Latest Ref Range: 20 - 29 mmol/L 25 23  Glucose Latest Ref Range: 65 - 99 mg/dL 126 (H) 115 (H)  BUN Latest Ref Range: 8 - 27 mg/dL 18 23  Creatinine Latest Ref Range: 0.57 - 1.00 mg/dL 0.80 0.97  Calcium Latest Ref Range: 8.7 -  10.3 mg/dL 9.7 9.8  Anion gap Latest Ref Range: 5 - 15  12   BUN/Creatinine Ratio Latest Ref Range: 12 - 28   24  GFR, Est Non African American Latest Ref Range: >59 mL/min/1.73 >60 54 (L)  GFR, Est African American Latest Ref Range: >59 mL/min/1.73 >60 62    Review of Systems  Constitution: Positive for malaise/fatigue. Negative for decreased appetite, weight gain and weight loss.  HENT: Negative for congestion.   Eyes: Negative for visual disturbance.  Cardiovascular: Negative for chest pain, dyspnea on exertion, leg swelling, palpitations and syncope.  Respiratory: Negative for shortness of breath.   Endocrine: Negative for cold intolerance.  Hematologic/Lymphatic: Does not bruise/bleed easily.  Skin: Negative for itching and rash.  Musculoskeletal: Negative for myalgias.  Gastrointestinal: Negative for abdominal pain, nausea and vomiting.  Genitourinary: Negative for dysuria.  Neurological: Negative for dizziness and weakness.  Psychiatric/Behavioral: The patient is not nervous/anxious.   All other systems reviewed and are negative.        Vitals:   11/23/18 1100 11/23/18 1105  BP: 117/80 122/72  Pulse: (!) 57 66    Objective:    Physical Exam Not performed. Telephone visit.      Assessment & Recommendations:   85 year old African-American female with hypertension, type 2 diabetes mellitus, hyperlipidemia,  iron deficiency anemia, recent Lt ACA stroke 05/2018, persistent Afib, mild to mod carotid stenosis.  1. Fatigue: Improved.   2. Persistent atrial fibrillation Reasonable to continue rate control strategy. Continue coreg 12.5 mg bid.  CHA2DS2VASc score 7, annual stroke risk 11% Continue eliquis 5 mg bid  3.Hypertension Stop losartan-HCTZ. Start losartan 100 mg without HCTZ. Conitnue amlodipine 5 mg, carvedilol 12.5 mg bid, spironolactone 25 mg daily.   4. Cerebrovascular accident (CVA) due to embolism of left anterior cerebral artery (HCC) Minimal neurodeficit. She has 70% stenosis in Rt ICA, that needs medical management. Only mild stenosis on Lt. Continue Crestor  10 mg. Not on aspirin given ongoing use of Xarelto for likely thromboembolic stroke  5. Carotid stenosis, asymptomatic, bilateral As above.  6. Type 2 diabetes mellitus without complication, without long-term current use of insulin ([place]) Follow up with PCP.  3 month f/u.  Nigel Mormon, MD [facility] [place] Pager: [phone] Office: [phone] If no answer Cell [phone]

## 2018-12-05 DIAGNOSIS — I10 Essential (primary) hypertension: Secondary | ICD-10-CM | POA: Diagnosis not present

## 2018-12-05 DIAGNOSIS — E039 Hypothyroidism, unspecified: Secondary | ICD-10-CM | POA: Diagnosis not present

## 2018-12-05 DIAGNOSIS — E1169 Type 2 diabetes mellitus with other specified complication: Secondary | ICD-10-CM | POA: Diagnosis not present

## 2018-12-05 DIAGNOSIS — I480 Paroxysmal atrial fibrillation: Secondary | ICD-10-CM | POA: Diagnosis not present

## 2018-12-05 DIAGNOSIS — I639 Cerebral infarction, unspecified: Secondary | ICD-10-CM | POA: Diagnosis not present

## 2018-12-05 DIAGNOSIS — E1129 Type 2 diabetes mellitus with other diabetic kidney complication: Secondary | ICD-10-CM | POA: Diagnosis not present

## 2019-01-06 DIAGNOSIS — I480 Paroxysmal atrial fibrillation: Secondary | ICD-10-CM | POA: Diagnosis not present

## 2019-01-06 DIAGNOSIS — E7800 Pure hypercholesterolemia, unspecified: Secondary | ICD-10-CM | POA: Diagnosis not present

## 2019-01-06 DIAGNOSIS — I10 Essential (primary) hypertension: Secondary | ICD-10-CM | POA: Diagnosis not present

## 2019-01-06 DIAGNOSIS — I639 Cerebral infarction, unspecified: Secondary | ICD-10-CM | POA: Diagnosis not present

## 2019-01-06 DIAGNOSIS — E039 Hypothyroidism, unspecified: Secondary | ICD-10-CM | POA: Diagnosis not present

## 2019-01-06 DIAGNOSIS — E1169 Type 2 diabetes mellitus with other specified complication: Secondary | ICD-10-CM | POA: Diagnosis not present

## 2019-01-06 DIAGNOSIS — E1129 Type 2 diabetes mellitus with other diabetic kidney complication: Secondary | ICD-10-CM | POA: Diagnosis not present

## 2019-01-06 DIAGNOSIS — E119 Type 2 diabetes mellitus without complications: Secondary | ICD-10-CM | POA: Diagnosis not present

## 2019-02-09 DIAGNOSIS — H00025 Hordeolum internum left lower eyelid: Secondary | ICD-10-CM | POA: Diagnosis not present

## 2019-02-23 ENCOUNTER — Ambulatory Visit (INDEPENDENT_AMBULATORY_CARE_PROVIDER_SITE_OTHER): Payer: Medicare Other | Admitting: Cardiology

## 2019-02-23 ENCOUNTER — Other Ambulatory Visit: Payer: Self-pay

## 2019-02-23 ENCOUNTER — Encounter: Payer: Self-pay | Admitting: Cardiology

## 2019-02-23 VITALS — BP 147/73 | HR 63 | Temp 97.2°F | Ht 63.0 in | Wt 182.3 lb

## 2019-02-23 DIAGNOSIS — I4819 Other persistent atrial fibrillation: Secondary | ICD-10-CM

## 2019-02-23 DIAGNOSIS — E782 Mixed hyperlipidemia: Secondary | ICD-10-CM | POA: Diagnosis not present

## 2019-02-23 DIAGNOSIS — I6521 Occlusion and stenosis of right carotid artery: Secondary | ICD-10-CM

## 2019-02-23 DIAGNOSIS — I6523 Occlusion and stenosis of bilateral carotid arteries: Secondary | ICD-10-CM | POA: Insufficient documentation

## 2019-02-23 DIAGNOSIS — I10 Essential (primary) hypertension: Secondary | ICD-10-CM

## 2019-02-23 NOTE — Progress Notes (Signed)
Subjective:   Haley Wilcox, female    DOB: 06/28/1931, 85 y.o.   MRN: [id number]   Chief complaint:  Atrial fibrillation   HPI  85 year old African-American female with hypertension, type 2 diabetes mellitus, hyperlipidemia, iron deficiency anemia, Lt ACA stroke 05/2018, persistent Afib, Rt ICA 70% stenosis.  She is doing well and denies chest pain, shortness of breath, palpitations, leg edema, orthopnea, PND, TIA/syncope. She has generalized fatigue symptoms, that are unchanged.    Past Medical History:  Diagnosis Date  . A-fib ([place])   . Constipation   . Diabetes mellitus   . Gout   . Hyperlipidemia   . Hypertension   . Stroke [facility])      Past Surgical History:  Procedure Laterality Date  . ABDOMINAL HYSTERECTOMY    . d &c    . DILATION AND CURETTAGE OF UTERUS    . EYE SURGERY     bilateral cataract  . TUBAL LIGATION       Social History   Socioeconomic History  . Marital status: Widowed    Spouse name: Not on file  . Number of children: 2  . Years of education: Not on file  . Highest education level: Not on file  Occupational History  . Occupation: retired  Scientific laboratory technician  . Financial resource strain: Not on file  . Food insecurity    Worry: Not on file    Inability: Not on file  . Transportation needs    Medical: Not on file    Non-medical: Not on file  Tobacco Use  . Smoking status: Never Smoker  . Smokeless tobacco: Never Used  Substance and Sexual Activity  . Alcohol use: No    Alcohol/week: 0.0 standard drinks  . Drug use: No  . Sexual activity: Yes  Lifestyle  . Physical activity    Days per week: Not on file    Minutes per session: Not on file  . Stress: Not on file  Relationships  . Social Herbalist on phone: Not on file    Gets together: Not on file    Attends religious service: Not on file    Active member of club or organization: Not on file    Attends meetings of clubs or organizations: Not on file   Relationship status: Not on file  . Intimate partner violence    Fear of current or ex partner: Not on file    Emotionally abused: Not on file    Physically abused: Not on file    Forced sexual activity: Not on file  Other Topics Concern  . Not on file  Social History Narrative  . Not on file     Current Outpatient Medications on File Prior to Visit  Medication Sig Dispense Refill  . amLODipine (NORVASC) 5 MG tablet Take 1 tablet (5 mg total) by mouth daily. 60 tablet 3  . apixaban (ELIQUIS) 5 MG TABS tablet Take 1 tablet (5 mg total) by mouth 2 (two) times daily. 60 tablet 2  . carvedilol (COREG) 12.5 MG tablet Take 12.5 mg by mouth 2 (two) times daily.    . diphenhydramine-acetaminophen (TYLENOL PM EXTRA STRENGTH) 25-500 MG TABS tablet Take 1 tablet by mouth at bedtime as needed (sleep).     Marland Kitchen doxazosin (CARDURA) 2 MG tablet Take 2 mg by mouth every evening.     Marland Kitchen levothyroxine (SYNTHROID, LEVOTHROID) 25 MCG tablet Take 25 mcg by mouth daily.    Marland Kitchen losartan (COZAAR)  100 MG tablet Take 1 tablet (100 mg total) by mouth daily. 60 tablet 3  . metFORMIN (GLUCOPHAGE) 500 MG tablet TAKE 1 TABLET DAILY WITH BREAKFAST (Patient taking differently: Take 500 mg by mouth daily with breakfast. ) 90 tablet 2  . Multiple Vitamins-Minerals (MULTIVITAMIN GUMMIES ADULT) CHEW Chew 2 each by mouth daily.    Marland Kitchen omeprazole (PRILOSEC) 20 MG capsule TAKE 1 CAPSULE DAILY AS NEEDED (HEART BURN) (Patient taking differently: Take 20 mg by mouth daily as needed (heart burn). ) 30 capsule 0  . polyethylene glycol powder (GLYCOLAX/MIRALAX) powder Mix 17gm in 4oz of water.  Start with 2 doses per day, but may take up to 6 doses per day.  Titrate number of doses to allow 2-3 soft bowel movements daily. (Patient taking differently: Take 17 g by mouth daily. Mix 17gm in 4oz of water) 850 g 1  . rosuvastatin (CRESTOR) 10 MG tablet Take 10 mg by mouth daily.    Marland Kitchen spironolactone (ALDACTONE) 25 MG tablet Take 1 tablet (25 mg  total) by mouth daily. 90 tablet 3   No current facility-administered medications on file prior to visit.     Cardiovascular studies:  EKG 02/23/2019: Atrial fibrillation with slow ventricular rate 56 bpm. Low voltage in precordial leads.  Nonspecific T-abnormality.    Echocardiogram 06/08/2018:  1. The left ventricle has normal systolic function with an ejection fraction of 60-65%. The cavity size was normal. There is mildly increased left ventricular wall thickness. Left ventricular diastolic function could not be evaluated secondary to atrial  fibrillation.  2. The right ventricle has normal systolic function. The cavity was normal. There is no increase in right ventricular wall thickness.  3. Left atrial size was severely dilated.  4. Right atrial size was mildly dilated.  5. The aortic root and ascending aorta are normal in size and structure.  6. The interatrial septum was not assessed.  Recent labs: Results for Haley Wilcox (MRN [id number]) as of 09/28/2018 09:38  Ref. Range 06/09/2018 04:28 09/21/2018 [id number]  BASIC METABOLIC PANEL Unknown Rpt (A) Rpt (A)  Sodium Latest Ref Range: 134 - 144 mmol/L 139 134  Potassium Latest Ref Range: 3.5 - 5.2 mmol/L 3.5 4.8  Chloride Latest Ref Range: 96 - 106 mmol/L 102 96  CO2 Latest Ref Range: 20 - 29 mmol/L 25 23  Glucose Latest Ref Range: 65 - 99 mg/dL 126 (H) 115 (H)  BUN Latest Ref Range: 8 - 27 mg/dL 18 23  Creatinine Latest Ref Range: 0.57 - 1.00 mg/dL 0.80 0.97  Calcium Latest Ref Range: 8.7 - 10.3 mg/dL 9.7 9.8  Anion gap Latest Ref Range: 5 - 15  12   BUN/Creatinine Ratio Latest Ref Range: 12 - 28   24  GFR, Est Non African American Latest Ref Range: >59 mL/min/1.73 >60 54 (L)  GFR, Est African American Latest Ref Range: >59 mL/min/1.73 >60 62    Review of Systems  Constitution: Positive for malaise/fatigue. Negative for decreased appetite, weight gain and weight loss.  HENT: Negative for congestion.   Eyes: Negative for  visual disturbance.  Cardiovascular: Negative for chest pain, dyspnea on exertion, leg swelling, palpitations and syncope.  Respiratory: Negative for shortness of breath.   Endocrine: Negative for cold intolerance.  Hematologic/Lymphatic: Does not bruise/bleed easily.  Skin: Negative for itching and rash.  Musculoskeletal: Negative for myalgias.  Gastrointestinal: Negative for abdominal pain, nausea and vomiting.  Genitourinary: Negative for dysuria.  Neurological: Negative for dizziness and weakness.  Psychiatric/Behavioral: The  patient is not nervous/anxious.   All other systems reviewed and are negative.        Vitals:   02/23/19 1132  BP: (!) 147/73  Pulse: 63  Temp: (!) 97.2 F (36.2 C)  SpO2: 98%    Objective:    Physical Exam  Constitutional: She is oriented to person, place, and time. She appears well-developed and well-nourished. No distress.  HENT:  Head: Normocephalic and atraumatic.  Eyes: Pupils are equal, round, and reactive to light. Conjunctivae are normal.  Neck: No JVD present.  Cardiovascular: Normal rate and intact distal pulses. An irregularly irregular rhythm present.  No murmur heard. Pulmonary/Chest: Effort normal and breath sounds normal. She has no wheezes. She has no rales.  Abdominal: Soft. Bowel sounds are normal. There is no rebound.  Musculoskeletal:        General: No edema.  Lymphadenopathy:    She has no cervical adenopathy.  Neurological: She is alert and oriented to person, place, and time. No cranial nerve deficit.  Skin: Skin is warm and dry.  Psychiatric: She has a normal mood and affect.  Nursing note and vitals reviewed.       Assessment & Recommendations:   85 year old African-American female with hypertension, type 2 diabetes mellitus, hyperlipidemia, iron deficiency anemia, recent Lt ACA stroke 05/2018, persistent Afib, mild to mod carotid stenosis.  1. Fatigue: Multifactorial, stable. I do not think she warrants  cardioversion in absence of any other symptoms.   2. Persistent atrial fibrillation Reasonable to continue rate control strategy. Continue coreg 12.5 mg bid.  CHA2DS2VASc score 7, annual stroke risk 11% Continue eliquis 5 mg bid  3.Hypertension Blood pressure usually lower at home. Conntinue losartan 100, amlodipine 5 mg, carvedilol 12.5 mg bid, spironolactone 25 mg daily.   4. Cerebrovascular accident (CVA) due to embolism of left anterior cerebral artery (HCC) Minimal neurodeficit. She has 70% stenosis in Rt ICA, that needs medical management. Only mild stenosis on Lt. Will repeat carotid US.  Continue Crestor  10 mg. Not on aspirin given ongoing use of Xarelto for likely thromboembolic stroke. Will check lipid panel.   5. Carotid stenosis, asymptomatic, bilateral As above.  6. Type 2 diabetes mellitus without complication, without long-term current use of insulin ([place]) Follow up with PCP.  F/u in 6 months.   Nigel Mormon, MD [facility] [place] Pager: [phone] Office: [phone] If no answer Cell [phone]

## 2019-02-27 DIAGNOSIS — H40023 Open angle with borderline findings, high risk, bilateral: Secondary | ICD-10-CM | POA: Diagnosis not present

## 2019-02-27 DIAGNOSIS — E119 Type 2 diabetes mellitus without complications: Secondary | ICD-10-CM | POA: Diagnosis not present

## 2019-05-12 DIAGNOSIS — E7800 Pure hypercholesterolemia, unspecified: Secondary | ICD-10-CM | POA: Diagnosis not present

## 2019-05-12 DIAGNOSIS — I10 Essential (primary) hypertension: Secondary | ICD-10-CM | POA: Diagnosis not present

## 2019-05-12 DIAGNOSIS — I639 Cerebral infarction, unspecified: Secondary | ICD-10-CM | POA: Diagnosis not present

## 2019-05-12 DIAGNOSIS — E1169 Type 2 diabetes mellitus with other specified complication: Secondary | ICD-10-CM | POA: Diagnosis not present

## 2019-05-12 DIAGNOSIS — E119 Type 2 diabetes mellitus without complications: Secondary | ICD-10-CM | POA: Diagnosis not present

## 2019-05-12 DIAGNOSIS — E1129 Type 2 diabetes mellitus with other diabetic kidney complication: Secondary | ICD-10-CM | POA: Diagnosis not present

## 2019-05-12 DIAGNOSIS — I480 Paroxysmal atrial fibrillation: Secondary | ICD-10-CM | POA: Diagnosis not present

## 2019-05-12 DIAGNOSIS — Z7984 Long term (current) use of oral hypoglycemic drugs: Secondary | ICD-10-CM | POA: Diagnosis not present

## 2019-05-12 DIAGNOSIS — E039 Hypothyroidism, unspecified: Secondary | ICD-10-CM | POA: Diagnosis not present

## 2019-05-29 DIAGNOSIS — W19XXXA Unspecified fall, initial encounter: Secondary | ICD-10-CM | POA: Diagnosis not present

## 2019-05-29 DIAGNOSIS — T148XXA Other injury of unspecified body region, initial encounter: Secondary | ICD-10-CM | POA: Diagnosis not present

## 2019-05-29 DIAGNOSIS — R1904 Left lower quadrant abdominal swelling, mass and lump: Secondary | ICD-10-CM | POA: Diagnosis not present

## 2019-05-30 ENCOUNTER — Other Ambulatory Visit: Payer: Self-pay | Admitting: Internal Medicine

## 2019-05-30 ENCOUNTER — Ambulatory Visit
Admission: RE | Admit: 2019-05-30 | Discharge: 2019-05-30 | Disposition: A | Discharge status: Home / Self Care | Payer: Medicare Other | Source: Ambulatory Visit | Attending: Internal Medicine | Admitting: Internal Medicine

## 2019-05-30 DIAGNOSIS — T148XXA Other injury of unspecified body region, initial encounter: Secondary | ICD-10-CM

## 2019-05-30 DIAGNOSIS — R1904 Left lower quadrant abdominal swelling, mass and lump: Secondary | ICD-10-CM

## 2019-05-30 DIAGNOSIS — S301XXA Contusion of abdominal wall, initial encounter: Secondary | ICD-10-CM | POA: Diagnosis not present

## 2019-05-30 DIAGNOSIS — W19XXXA Unspecified fall, initial encounter: Secondary | ICD-10-CM

## 2019-06-07 DIAGNOSIS — R7989 Other specified abnormal findings of blood chemistry: Secondary | ICD-10-CM | POA: Diagnosis not present

## 2019-07-13 ENCOUNTER — Other Ambulatory Visit: Payer: Self-pay | Admitting: Cardiology

## 2019-07-13 DIAGNOSIS — I10 Essential (primary) hypertension: Secondary | ICD-10-CM

## 2019-08-07 ENCOUNTER — Other Ambulatory Visit (HOSPITAL_COMMUNITY): Payer: Self-pay | Admitting: Cardiology

## 2019-08-07 DIAGNOSIS — E782 Mixed hyperlipidemia: Secondary | ICD-10-CM | POA: Diagnosis not present

## 2019-08-08 LAB — LIPID PANEL
Chol/HDL Ratio: 2.5 ratio (ref 0.0–4.4)
Cholesterol, Total: 123 mg/dL (ref 100–199)
HDL: 49 mg/dL (ref >39)
LDL Chol Calc (NIH): 58 mg/dL (ref 0–99)
Triglycerides: 84 mg/dL (ref 0–149)
VLDL Cholesterol Cal: 16 mg/dL (ref 5–40)

## 2019-08-14 ENCOUNTER — Other Ambulatory Visit: Payer: Self-pay

## 2019-08-14 ENCOUNTER — Ambulatory Visit: Payer: Medicare Other

## 2019-08-14 DIAGNOSIS — I6523 Occlusion and stenosis of bilateral carotid arteries: Secondary | ICD-10-CM | POA: Diagnosis not present

## 2019-08-14 DIAGNOSIS — I6521 Occlusion and stenosis of right carotid artery: Secondary | ICD-10-CM

## 2019-08-20 ENCOUNTER — Other Ambulatory Visit: Payer: Self-pay | Admitting: Cardiology

## 2019-08-20 DIAGNOSIS — I6523 Occlusion and stenosis of bilateral carotid arteries: Secondary | ICD-10-CM

## 2019-08-23 NOTE — Progress Notes (Signed)
Subjective:   Haley Wilcox, female    DOB: October 01, 1931, 86 y.o.   MRN: [id number]   Chief complaint:  Atrial fibrillation   HPI   86 y.o. 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  She has unchanged fatigue symptoms, ever since stroke. No new TIA?stroke symptoms.  Reviewed recent labs with the patient. LDL has increased. Blood pressure is well controlled. She endorses that physical activity has been limited over past one year.   Current Outpatient Medications on File Prior to Visit  Medication Sig Dispense Refill  . apixaban (ELIQUIS) 5 MG TABS tablet Take 1 tablet (5 mg total) by mouth 2 (two) times daily. 60 tablet 2  . carvedilol (COREG) 12.5 MG tablet Take 12.5 mg by mouth 2 (two) times daily.    . diphenhydramine-acetaminophen (TYLENOL PM EXTRA STRENGTH) 25-500 MG TABS tablet Take 1 tablet by mouth at bedtime as needed (sleep).     Marland Kitchen doxazosin (CARDURA) 2 MG tablet Take 2 mg by mouth every evening.     Marland Kitchen levothyroxine (SYNTHROID, LEVOTHROID) 25 MCG tablet Take 25 mcg by mouth daily.    . metFORMIN (GLUCOPHAGE) 500 MG tablet TAKE 1 TABLET DAILY WITH BREAKFAST (Patient taking differently: Take 500 mg by mouth 2 (two) times daily with a meal. ) 90 tablet 2  . Multiple Vitamins-Minerals (MULTIVITAMIN GUMMIES ADULT) CHEW Chew 2 each by mouth daily.    Marland Kitchen omeprazole (PRILOSEC) 20 MG capsule TAKE 1 CAPSULE DAILY AS NEEDED (HEART BURN) (Patient taking differently: Take 20 mg by mouth daily as needed (heart burn). ) 30 capsule 0  . polyethylene glycol powder (GLYCOLAX/MIRALAX) powder Mix 17gm in 4oz of water.  Start with 2 doses per day, but may take up to 6 doses per day.  Titrate number of doses to allow 2-3 soft bowel movements daily. (Patient taking differently: Take 17 g by mouth daily. Mix 17gm in 4oz of water) 850 g 1  . rosuvastatin  (CRESTOR) 10 MG tablet Take 10 mg by mouth daily.    Marland Kitchen spironolactone (ALDACTONE) 25 MG tablet TAKE 1 TABLET DAILY 90 tablet 0  . amLODipine (NORVASC) 5 MG tablet Take 1 tablet (5 mg total) by mouth daily. 60 tablet 3  . losartan (COZAAR) 100 MG tablet Take 1 tablet (100 mg total) by mouth daily. 60 tablet 3   No current facility-administered medications on file prior to visit.    Cardiovascular studies:  EKG 08/24/3019: Atrial fibrillation with slow ventricular response 54 bpm Low voltage in precordial leads.  Nonspecific T-abnormality.   Carotid artery duplex 08/14/2019:  Stenosis in the right internal carotid artery (>=70%). The right PSV internal/common carotid artery ratio of 5.08 is consistent with a stenosis of >70%.  Stenosis in the left internal carotid artery (50-69%).  Antegrade right vertebral artery flow. Antegrade left vertebral artery flow.  Follow up in six months is appropriate if clinically indicated.  Compared to the study done on 06/08/2018, there is progression of disease on right, this correlated with CTA findings of 70% stenosis at the same time. There is progression of disease in the left ICA from < 30% stenosis.    EKG 02/23/2019: Atrial fibrillation with slow ventricular rate 56 bpm. Low voltage in precordial leads.  Nonspecific T-abnormality.    Echocardiogram 06/08/2018:  1. The left ventricle has normal systolic function with an ejection fraction of 60-65%. The cavity size  was normal. There is mildly increased left ventricular wall thickness. Left ventricular diastolic function could not be evaluated secondary to atrial  fibrillation.  2. The right ventricle has normal systolic function. The cavity was normal. There is no increase in right ventricular wall thickness.  3. Left atrial size was severely dilated.  4. Right atrial size was mildly dilated.  5. The aortic root and ascending aorta are normal in size and structure.  6. The interatrial septum was  not assessed.   Recent labs: 08/07/2019: Glucose 169, BUN/Cr 20/0.9. eGFR 54. HbA1C 7.5% Chol 123, TG 84, HDL 49, LDL 155 TSH 3.3 normal   05/17/20220: Chol 123, TG 84, HDL 49, LDL 58  09/21/2018: Glucose 115, BUN/Cr 23/0.97. EGFR 62. Na/K 134/4.8. Rest of the CMP normal  06/09/2018: H/H 12.8/39.6. MCV 85. Platelets 227 TSH 6.801 (H) HbA1C 6.7%   Review of Systems  Constitution: Positive for malaise/fatigue. Negative for decreased appetite, weight gain and weight loss.  HENT: Negative for congestion.   Eyes: Negative for visual disturbance.  Cardiovascular: Negative for chest pain, dyspnea on exertion, leg swelling, palpitations and syncope.  Respiratory: Negative for shortness of breath.   Endocrine: Negative for cold intolerance.  Hematologic/Lymphatic: Does not bruise/bleed easily.  Skin: Negative for itching and rash.  Musculoskeletal: Negative for myalgias.  Gastrointestinal: Negative for abdominal pain, nausea and vomiting.  Genitourinary: Negative for dysuria.  Neurological: Negative for dizziness and weakness.  Psychiatric/Behavioral: The patient is not nervous/anxious.   All other systems reviewed and are negative.       Body mass index is 31.6 kg/m.  Vitals:   08/24/19 1012  BP: (!) 140/59  Pulse: (!) 59  Resp: 17  SpO2: 97%    Objective:    Physical Exam  Constitutional: No distress.  Neck: No JVD present.  Cardiovascular: Normal rate and intact distal pulses. An irregularly irregular rhythm present.  No murmur heard. Pulmonary/Chest: Effort normal and breath sounds normal. She has no wheezes. She has no rales.  Musculoskeletal:        General: No edema.  Psychiatric: She has a normal mood and affect.  Nursing note and vitals reviewed.       Assessment & Recommendations:   86 y.o. 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  Fatigue: Multifactorial, stable. I do not think she warrants cardioversion in absence of any other symptoms.   Persistent atrial fibrillation Reasonable to continue rate control strategy. Continue coreg 12.5 mg bid.  CHA2DS2VASc score 7, annual stroke risk 11% Continue eliquis 5 mg bid  Hypertension Controlled  H/o stroke Stroke in Lt ACA. Minimal neurodeficit.  Progressive >70% stenosis in Rt ICA, without symptoms. Increase Crestor to 20 mg given elevated LDL.  Not on Aspirin due to ongoing use of eliquis given Afib. Recommend f/u w/Neurology.  Carotid stenosis, asymptomatic, bilateral As above.  Type 2 DM: Follow up with PCP.   F/u in 6 months.   Nigel Mormon, MD [facility] Pager: [phone] Office: [phone] If no answer Cell [phone]

## 2019-08-24 ENCOUNTER — Other Ambulatory Visit: Payer: Self-pay

## 2019-08-24 ENCOUNTER — Encounter: Payer: Self-pay | Admitting: Cardiology

## 2019-08-24 ENCOUNTER — Ambulatory Visit: Payer: Medicare Other | Admitting: Cardiology

## 2019-08-24 VITALS — BP 132/56 | HR 57 | Resp 17 | Ht 63.0 in | Wt 178.4 lb

## 2019-08-24 DIAGNOSIS — I4819 Other persistent atrial fibrillation: Secondary | ICD-10-CM | POA: Diagnosis not present

## 2019-08-24 DIAGNOSIS — I10 Essential (primary) hypertension: Secondary | ICD-10-CM | POA: Diagnosis not present

## 2019-08-24 DIAGNOSIS — E782 Mixed hyperlipidemia: Secondary | ICD-10-CM | POA: Diagnosis not present

## 2019-08-24 DIAGNOSIS — Z8673 Personal history of transient ischemic attack (TIA), and cerebral infarction without residual deficits: Secondary | ICD-10-CM | POA: Insufficient documentation

## 2019-08-24 DIAGNOSIS — E119 Type 2 diabetes mellitus without complications: Secondary | ICD-10-CM | POA: Diagnosis not present

## 2019-08-24 DIAGNOSIS — I6523 Occlusion and stenosis of bilateral carotid arteries: Secondary | ICD-10-CM | POA: Diagnosis not present

## 2019-08-24 MED ORDER — ROSUVASTATIN CALCIUM 10 MG PO TABS: 10.0000 mg | ORAL_TABLET | Freq: Every day | ORAL | 2 refills | Status: DC

## 2019-09-04 ENCOUNTER — Other Ambulatory Visit: Payer: Self-pay

## 2019-09-04 ENCOUNTER — Ambulatory Visit (INDEPENDENT_AMBULATORY_CARE_PROVIDER_SITE_OTHER): Payer: Medicare Other | Admitting: Neurology

## 2019-09-04 ENCOUNTER — Encounter: Payer: Self-pay | Admitting: Neurology

## 2019-09-04 VITALS — BP 130/65 | HR 69 | Ht 63.0 in | Wt 179.2 lb

## 2019-09-04 DIAGNOSIS — E7800 Pure hypercholesterolemia, unspecified: Secondary | ICD-10-CM | POA: Diagnosis not present

## 2019-09-04 DIAGNOSIS — R5383 Other fatigue: Secondary | ICD-10-CM | POA: Diagnosis not present

## 2019-09-04 DIAGNOSIS — I639 Cerebral infarction, unspecified: Secondary | ICD-10-CM | POA: Diagnosis not present

## 2019-09-04 DIAGNOSIS — Z0000 Encounter for general adult medical examination without abnormal findings: Secondary | ICD-10-CM | POA: Diagnosis not present

## 2019-09-04 DIAGNOSIS — I6523 Occlusion and stenosis of bilateral carotid arteries: Secondary | ICD-10-CM

## 2019-09-04 DIAGNOSIS — I480 Paroxysmal atrial fibrillation: Secondary | ICD-10-CM | POA: Diagnosis not present

## 2019-09-04 DIAGNOSIS — Z1389 Encounter for screening for other disorder: Secondary | ICD-10-CM | POA: Diagnosis not present

## 2019-09-04 DIAGNOSIS — I6990 Unspecified sequelae of unspecified cerebrovascular disease: Secondary | ICD-10-CM

## 2019-09-04 DIAGNOSIS — I10 Essential (primary) hypertension: Secondary | ICD-10-CM | POA: Diagnosis not present

## 2019-09-04 DIAGNOSIS — E1169 Type 2 diabetes mellitus with other specified complication: Secondary | ICD-10-CM | POA: Diagnosis not present

## 2019-09-04 DIAGNOSIS — E039 Hypothyroidism, unspecified: Secondary | ICD-10-CM | POA: Diagnosis not present

## 2019-09-04 NOTE — Patient Instructions (Signed)
I had a long d/w patient about her remote stroke,atrial fibrillation, asymptomatic right caroitid stenosis, risk for recurrent stroke/TIAs, personally independently reviewed imaging studies and stroke evaluation results and answered questions.Continue Eliquis (apixaban) daily  for secondary stroke prevention and maintain strict control of hypertension with blood pressure goal below 130/90, diabetes with hemoglobin A1c goal below 6.5% and lipids with LDL cholesterol goal below 70 mg/dL. I also advised the patient to eat a healthy diet with plenty of whole grains, cereals, fruits and vegetables, exercise regularly and maintain ideal body weight.  Continue yearly follow-up carotid ultrasound studies for asymptomatic moderate right carotid stenosis.  She was advised to see her primary care physician for her fatigue and tiring easily.  Followup in the future with my nurse practitioner Janett Billow in 1 year or call earlier if necessary.

## 2019-09-04 NOTE — Progress Notes (Signed)
[facility] [address] [place]. [place] [address] [phone]       OFFICE FOLLOW-UP NOTE  Haley Wilcox Date of Birth:  [birth year] Medical Record Number:  [id number]   HPI: Follow-up visit 09/04/2019 Haley Wilcox is a 86 year old lady seen today for office follow-up visit following virtual video for visit on 07/25/2018 with Janett Billow nurse practitioner.  She states she continues to do well she has had no recurrent stroke or TIA symptoms.  She remains on Eliquis which is tolerating well without bruising or bleeding.  Blood pressures well controlled on Norvasc Cozaar and Cardura and today it is 130/63.  She is tolerating Crestor well without muscle aches and pains but is not sure when last cholesterol was checked.  Her diabetes control was not satisfactory and primary physician Dr. Delfina Redwood asked her to increase her Metformin recently to twice daily.  She did have follow-up carotid ultrasound done a week ago in Dr. Bonney Roussel office but report is awaited.  She has no neurological symptoms except persistent fatigue and tiredness which she noticed post stroke which has not yet improved.  No new complaints. Virtual video visit 5/4/2020Franky Macho, NP ):Haley Wilcox was initially scheduled today for in office hospital follow-up regarding left ACA infarct embolic pattern secondary to new onset AF on 06/07/2018 but due to COVID-19 safety precautions, visit transition to telemedicine via [email or web address] with patients consent. History obtained from patient and chart review. Reviewed all radiology images and labs personally.  HaleyJennene S Jacksonis a 86 y.o.femalewith history of DB, HTN, HLD, and Fe deficiency anemiawho presented with sudden onset aphasia and dizziness, also with mild R hand fine motor deficits. CT had reviewed and was negative for acute infarct.  MRI reviewed and showed 2 mm patchy left ACA parasagittal left frontal lobe infarct along with small vessel disease and  atrophy.  MRA head showed left ACA loss of flow, bilateral ICA siphon plaque and focal right P2 stenosis.  MRA neck showed right ICA 70% stenosis at origin and left ICA 20 to 25%.  2D echo unremarkable.  LDL 155 and A1c 6.7.  New diagnosis of atrial fibrillation recommended initiating Eliquis.  HTN stable.  Initiated atorvastatin 40 mg daily.  Continue to follow PCP for DM management.  She was discharged home in stable condition with residual deficit of decreased right hand dexterity. She has been stable from a stroke standpoint with complete resolution of prior deficits.  She hs been able to return to doing all ADLs and IADLs independently. She does have her daughter who lives with her currently to help with any needed assistance such as grocery shopping. Feels fatigued since her stroke. Denies any improvement. No fatigue prior to stroke. Since husbands death almost 10 years ago, she has not slept well since that time.  Has been told she snores at night.  She does endorse being active within her church along with continuing cooking, cleaning and laundry within her home.  Continues on Eliquis without side effects of bleeding or bruising.  Experienced myalgias while on atorvastatin and therefore switched to rosuvastatin with resolution of myalgias.  Blood pressure check during visit at 143/81.  No further concerns at this time.  Denies new or worsening stroke/TIA symptoms.  ROS:   14 system review of systems is positive for fatigue and tiredness only and all other systems negative PMH:  Past Medical History:  Diagnosis Date  . A-fib ([place])   . Constipation   . Diabetes mellitus   .  Gout   . Hyperlipidemia   . Hypertension   . Stroke [facility])     Social History:  Social History   Socioeconomic History  . Marital status: Widowed    Spouse name: Not on file  . Number of children: 2  . Years of education: Not on file  . Highest education level: Not on file  Occupational History  . Occupation:  retired  Tobacco Use  . Smoking status: Never Smoker  . Smokeless tobacco: Never Used  Vaping Use  . Vaping Use: Never used  Substance and Sexual Activity  . Alcohol use: No    Alcohol/week: 0.0 standard drinks  . Drug use: No  . Sexual activity: Yes  Other Topics Concern  . Not on file  Social History Narrative  . Not on file   Social Determinants of Health   Financial Resource Strain:   . Difficulty of Paying Living Expenses:   Food Insecurity:   . Worried About Charity fundraiser in the Last Year:   . Arboriculturist in the Last Year:   Transportation Needs:   . Film/video editor (Medical):   Marland Kitchen Lack of Transportation (Non-Medical):   Physical Activity:   . Days of Exercise per Week:   . Minutes of Exercise per Session:   Stress:   . Feeling of Stress :   Social Connections:   . Frequency of Communication with Friends and Family:   . Frequency of Social Gatherings with Friends and Family:   . Attends Religious Services:   . Active Member of Clubs or Organizations:   . Attends Archivist Meetings:   Marland Kitchen Marital Status:   Intimate Partner Violence:   . Fear of Current or Ex-Partner:   . Emotionally Abused:   Marland Kitchen Physically Abused:   . Sexually Abused:     Medications:   Current Outpatient Medications on File Prior to Visit  Medication Sig Dispense Refill  . apixaban (ELIQUIS) 5 MG TABS tablet Take 1 tablet (5 mg total) by mouth 2 (two) times daily. 60 tablet 2  . carvedilol (COREG) 12.5 MG tablet Take 12.5 mg by mouth 2 (two) times daily.    . diphenhydramine-acetaminophen (TYLENOL PM EXTRA STRENGTH) 25-500 MG TABS tablet Take 1 tablet by mouth at bedtime as needed (sleep).     Marland Kitchen doxazosin (CARDURA) 2 MG tablet Take 2 mg by mouth every evening.     Marland Kitchen levothyroxine (SYNTHROID, LEVOTHROID) 25 MCG tablet Take 25 mcg by mouth daily.    Marland Kitchen losartan (COZAAR) 100 MG tablet Take 1 tablet (100 mg total) by mouth daily. 60 tablet 3  . metFORMIN (GLUCOPHAGE) 500  MG tablet TAKE 1 TABLET DAILY WITH BREAKFAST (Patient taking differently: Take 500 mg by mouth 2 (two) times daily with a meal. ) 90 tablet 2  . Multiple Vitamins-Minerals (MULTIVITAMIN GUMMIES ADULT) CHEW Chew 2 each by mouth daily.    Marland Kitchen omeprazole (PRILOSEC) 20 MG capsule TAKE 1 CAPSULE DAILY AS NEEDED (HEART BURN) (Patient taking differently: Take 20 mg by mouth daily as needed (heart burn). ) 30 capsule 0  . polyethylene glycol powder (GLYCOLAX/MIRALAX) powder Mix 17gm in 4oz of water.  Start with 2 doses per day, but may take up to 6 doses per day.  Titrate number of doses to allow 2-3 soft bowel movements daily. (Patient taking differently: Take 17 g by mouth daily. Mix 17gm in 4oz of water) 850 g 1  . rosuvastatin (CRESTOR) 10 MG tablet Take  1 tablet (10 mg total) by mouth daily. 90 tablet 2  . spironolactone (ALDACTONE) 25 MG tablet TAKE 1 TABLET DAILY 90 tablet 0  . amLODipine (NORVASC) 5 MG tablet Take 1 tablet (5 mg total) by mouth daily. 60 tablet 3   No current facility-administered medications on file prior to visit.    Allergies:   Allergies  Allergen Reactions  . Fluoride Preparations Swelling    "Lips swell" when brushing teeth with toothpaste that has flouride   . Ivp Dye [Iodinated Diagnostic Agents] Rash    Physical Exam General: well developed, well nourished elderly lady, seated, in no evident distress Head: head normocephalic and atraumatic.  Neck: supple with no carotid or supraclavicular bruits Cardiovascular: regular rate and rhythm, no murmurs Musculoskeletal: no deformity Skin:  no rash/petichiae Vascular:  Normal pulses all extremities Vitals:   09/04/19 1302  BP: 130/65  Pulse: 69   Neurologic Exam Mental Status: Awake and fully alert. Oriented to place and time. Recent and remote memory intact. Attention span, concentration and fund of knowledge appropriate. Mood and affect appropriate.  Cranial Nerves: Fundoscopic exam not done pupils equal, briskly  reactive to light. Extraocular movements full without nystagmus. Visual fields full to confrontation. Hearing diminished bilaterally facial sensation intact. Face, tongue, palate moves normally and symmetrically.  Motor: Normal bulk and tone. Normal strength in all tested extremity muscles. Sensory.: intact to touch ,pinprick .position and vibratory sensation.  Coordination: Rapid alternating movements normal in all extremities. Finger-to-nose and heel-to-shin performed accurately bilaterally. Gait and Station: Arises from chair without difficulty. Stance is normal. Gait demonstrates normal stride length and balance . Able to heel, toe and tandem walk with mild  difficulty.  Reflexes: 1+ and symmetric. Toes downgoing.   NIHSS  0Modified Rankin  1   ASSESSMENT:86 y.o. year old female here with left ACA infarct embolic pattern on 06/11/2246 secondary to new onset AF.    Vascular risk factors include DM, HTN, HLD and new onset AF.  She has been stable from a stroke standpoint without residual deficits.      PLAN: I had a long d/w patient about her remote stroke,atrial fibrillation, asymptomatic right caroitid stenosis, risk for recurrent stroke/TIAs, personally independently reviewed imaging studies and stroke evaluation results and answered questions.Continue Eliquis (apixaban) daily  for secondary stroke prevention and maintain strict control of hypertension with blood pressure goal below 130/90, diabetes with hemoglobin A1c goal below 6.5% and lipids with LDL cholesterol goal below 70 mg/dL. I also advised the patient to eat a healthy diet with plenty of whole grains, cereals, fruits and vegetables, exercise regularly and maintain ideal body weight.  Continue yearly follow-up carotid ultrasound studies for asymptomatic moderate right carotid stenosis.  She was advised to see her primary care physician for her fatigue and tiring easily.  Followup in the future with my nurse practitioner Janett Billow in 1  year or call earlier if necessary. Greater than 50% of time during this 25 minute visit was spent on counseling,explanation of diagnosis of atrial fibrillation, embolic stroke, planning of further management, discussion with patient and family and coordination of care Antony Contras, MD  [facility] [address] [place], [place] [address]  Phone [phone] Fax [phone] Note: This document was prepared with digital dictation and possible smart phrase technology. Any transcriptional errors that result from this process are unintentional

## 2019-10-19 DIAGNOSIS — H40023 Open angle with borderline findings, high risk, bilateral: Secondary | ICD-10-CM | POA: Diagnosis not present

## 2019-11-01 ENCOUNTER — Other Ambulatory Visit: Payer: Self-pay | Admitting: Cardiology

## 2019-11-01 DIAGNOSIS — I10 Essential (primary) hypertension: Secondary | ICD-10-CM

## 2019-11-10 ENCOUNTER — Other Ambulatory Visit (HOSPITAL_COMMUNITY): Payer: Self-pay | Admitting: Cardiology

## 2019-11-10 DIAGNOSIS — E782 Mixed hyperlipidemia: Secondary | ICD-10-CM | POA: Diagnosis not present

## 2019-11-11 LAB — LIPID PANEL
Chol/HDL Ratio: 2.5 ratio (ref 0.0–4.4)
Cholesterol, Total: 129 mg/dL (ref 100–199)
HDL: 51 mg/dL (ref >39)
LDL Chol Calc (NIH): 59 mg/dL (ref 0–99)
Triglycerides: 100 mg/dL (ref 0–149)
VLDL Cholesterol Cal: 19 mg/dL (ref 5–40)

## 2019-11-14 ENCOUNTER — Encounter (INDEPENDENT_AMBULATORY_CARE_PROVIDER_SITE_OTHER): Payer: Self-pay | Admitting: Family Medicine

## 2019-11-14 ENCOUNTER — Other Ambulatory Visit: Payer: Self-pay

## 2019-11-14 ENCOUNTER — Ambulatory Visit (INDEPENDENT_AMBULATORY_CARE_PROVIDER_SITE_OTHER): Payer: Medicare Other | Admitting: Family Medicine

## 2019-11-14 VITALS — BP 130/72 | HR 63 | Temp 98.3°F | Ht 63.0 in | Wt 171.0 lb

## 2019-11-14 DIAGNOSIS — I4891 Unspecified atrial fibrillation: Secondary | ICD-10-CM

## 2019-11-14 DIAGNOSIS — R5383 Other fatigue: Secondary | ICD-10-CM

## 2019-11-14 DIAGNOSIS — R0602 Shortness of breath: Secondary | ICD-10-CM | POA: Diagnosis not present

## 2019-11-14 DIAGNOSIS — G629 Polyneuropathy, unspecified: Secondary | ICD-10-CM | POA: Diagnosis not present

## 2019-11-14 DIAGNOSIS — I152 Hypertension secondary to endocrine disorders: Secondary | ICD-10-CM

## 2019-11-14 DIAGNOSIS — E782 Mixed hyperlipidemia: Secondary | ICD-10-CM | POA: Diagnosis not present

## 2019-11-14 DIAGNOSIS — I10 Essential (primary) hypertension: Secondary | ICD-10-CM | POA: Diagnosis not present

## 2019-11-14 DIAGNOSIS — Z6830 Body mass index (BMI) 30.0-30.9, adult: Secondary | ICD-10-CM | POA: Diagnosis not present

## 2019-11-14 DIAGNOSIS — E1169 Type 2 diabetes mellitus with other specified complication: Secondary | ICD-10-CM | POA: Diagnosis not present

## 2019-11-14 DIAGNOSIS — E669 Obesity, unspecified: Secondary | ICD-10-CM | POA: Diagnosis not present

## 2019-11-14 DIAGNOSIS — M109 Gout, unspecified: Secondary | ICD-10-CM

## 2019-11-14 DIAGNOSIS — E1159 Type 2 diabetes mellitus with other circulatory complications: Secondary | ICD-10-CM

## 2019-11-14 DIAGNOSIS — R413 Other amnesia: Secondary | ICD-10-CM

## 2019-11-14 DIAGNOSIS — R0600 Dyspnea, unspecified: Secondary | ICD-10-CM | POA: Diagnosis not present

## 2019-11-14 DIAGNOSIS — Z1331 Encounter for screening for depression: Secondary | ICD-10-CM | POA: Diagnosis not present

## 2019-11-14 DIAGNOSIS — I6523 Occlusion and stenosis of bilateral carotid arteries: Secondary | ICD-10-CM | POA: Diagnosis not present

## 2019-11-14 DIAGNOSIS — Z0289 Encounter for other administrative examinations: Secondary | ICD-10-CM

## 2019-11-14 DIAGNOSIS — E785 Hyperlipidemia, unspecified: Secondary | ICD-10-CM

## 2019-11-14 DIAGNOSIS — E039 Hypothyroidism, unspecified: Secondary | ICD-10-CM

## 2019-11-14 DIAGNOSIS — E559 Vitamin D deficiency, unspecified: Secondary | ICD-10-CM | POA: Diagnosis not present

## 2019-11-14 MED ORDER — BLOOD GLUCOSE MONITORING SUPPL KIT: PACK | 0 refills | Status: DC

## 2019-11-15 LAB — FOLATE: Folate: >20 ng/mL (ref >3.0)

## 2019-11-15 LAB — CBC WITH DIFFERENTIAL/PLATELET
Basophils Absolute: 0.1 10*3/uL (ref 0.0–0.2)
Basos: 1 %
EOS (ABSOLUTE): 0.1 10*3/uL (ref 0.0–0.4)
Eos: 1 %
Hematocrit: 40.7 % (ref 34.0–46.6)
Hemoglobin: 12.8 g/dL (ref 11.1–15.9)
Immature Grans (Abs): 0.1 10*3/uL (ref 0.0–0.1)
Immature Granulocytes: 1 %
Lymphocytes Absolute: 1.5 10*3/uL (ref 0.7–3.1)
Lymphs: 17 %
MCH: 26.9 pg (ref 26.6–33.0)
MCHC: 31.4 g/dL — ABNORMAL LOW (ref 31.5–35.7)
MCV: 86 fL (ref 79–97)
Monocytes Absolute: 0.6 10*3/uL (ref 0.1–0.9)
Monocytes: 7 %
Neutrophils Absolute: 6.6 10*3/uL (ref 1.4–7.0)
Neutrophils: 73 %
Platelets: 213 10*3/uL (ref 150–450)
RBC: 4.75 x10E6/uL (ref 3.77–5.28)
RDW: 15.5 % — ABNORMAL HIGH (ref 11.7–15.4)
WBC: 9 10*3/uL (ref 3.4–10.8)

## 2019-11-15 LAB — HEMOGLOBIN A1C
Est. average glucose Bld gHb Est-mCnc: 163 mg/dL
Hgb A1c MFr Bld: 7.3 % — ABNORMAL HIGH (ref 4.8–5.6)

## 2019-11-15 LAB — COMPREHENSIVE METABOLIC PANEL
ALT: 12 IU/L (ref 0–32)
AST: 15 IU/L (ref 0–40)
Albumin/Globulin Ratio: 1.5 (ref 1.2–2.2)
Albumin: 4.6 g/dL (ref 3.6–4.6)
Alkaline Phosphatase: 72 IU/L (ref 48–121)
BUN/Creatinine Ratio: 22 (ref 12–28)
BUN: 20 mg/dL (ref 8–27)
Bilirubin Total: 0.3 mg/dL (ref 0.0–1.2)
CO2: 26 mmol/L (ref 20–29)
Calcium: 10.5 mg/dL — ABNORMAL HIGH (ref 8.7–10.3)
Chloride: 103 mmol/L (ref 96–106)
Creatinine, Ser: 0.93 mg/dL (ref 0.57–1.00)
GFR calc Af Amer: 65 mL/min/{1.73_m2} (ref >59)
GFR calc non Af Amer: 56 mL/min/{1.73_m2} — ABNORMAL LOW (ref >59)
Globulin, Total: 3 g/dL (ref 1.5–4.5)
Glucose: 136 mg/dL — ABNORMAL HIGH (ref 65–99)
Potassium: 4.7 mmol/L (ref 3.5–5.2)
Sodium: 142 mmol/L (ref 134–144)
Total Protein: 7.6 g/dL (ref 6.0–8.5)

## 2019-11-15 LAB — T3: T3, Total: 124 ng/dL (ref 71–180)

## 2019-11-15 LAB — VITAMIN B12: Vitamin B-12: 842 pg/mL (ref 232–1245)

## 2019-11-15 LAB — T4: T4, Total: 7 ug/dL (ref 4.5–12.0)

## 2019-11-15 LAB — INSULIN, RANDOM: INSULIN: 20.1 u[IU]/mL (ref 2.6–24.9)

## 2019-11-15 LAB — TSH: TSH: 3.03 u[IU]/mL (ref 0.450–4.500)

## 2019-11-15 LAB — VITAMIN D 25 HYDROXY (VIT D DEFICIENCY, FRACTURES): Vit D, 25-Hydroxy: 31.8 ng/mL (ref 30.0–100.0)

## 2019-11-15 NOTE — Progress Notes (Signed)
Chief Complaint:   OBESITY Haley Wilcox (MR# [id number]) is a 86 y.o. female who presents for evaluation and treatment of obesity and related comorbidities. Current BMI is Body mass index is 30.29 kg/m. Haley Wilcox has been struggling with her weight for many years and has been unsuccessful in either losing weight, maintaining weight loss, or reaching her healthy weight goal.  [name] is currently in the action stage of change and ready to dedicate time achieving and maintaining a healthier weight. Haley Wilcox is interested in becoming our patient and working on intensive lifestyle modifications including (but not limited to) diet and exercise for weight loss.  Haley Wilcox was here in 2019.  She lost 10+ pounds, but has now gained it back.  She has had a CVA since she was last seen with no residual effects.  She lives alone and is very independent.  No formal exercise, but does "landscape stuff", cleaning house, walks dog.  She was on Category 2 in January-February 2019 when last seen.  She would like to try Pescatarian and does not like eating "all that chicken".  [name]'s habits were reviewed today and are as follows: she thinks her family will eat healthier with her, her desired weight loss is 21 pounds, she started gaining weight after childbirth, her heaviest weight ever was 190 pounds, she craves sweets, she snacks frequently in the evenings, she frequently makes poor food choices and she struggles with emotional eating.  Depression Screen [name]'s Food and Mood (modified PHQ-9) score was 2.  Depression screen [facility] 2/9 11/14/2019  Decreased Interest 1  Down, Depressed, Hopeless 0  PHQ - 2 Score 1  Altered sleeping 0  Tired, decreased energy 0  Change in appetite 1  Feeling bad or failure about yourself  0  Trouble concentrating 0  Moving slowly or fidgety/restless 0  Suicidal thoughts 0  PHQ-9 Score 2  Difficult doing work/chores Not difficult at all   Subjective:   1. SOB  (shortness of breath) on exertion Haley Wilcox notes increasing shortness of breath with exercising and seems to be worsening over time with weight gain. She notes getting out of breath sooner with activity than she used to. This has not gotten acutely worse recently, but worse with increasing weight. Haley Wilcox denies shortness of breath at rest or orthopnea.  2. Other fatigue Haley Wilcox admits to daytime somnolence and does not answer whether she wakes up still tired. Patent has a history of symptoms of daytime fatigue, morning headache and snoring. Haley Wilcox generally gets unknown hours of sleep per night, and states that she has poor quality. Snoring is present, but not sure how that is known since she lives alone. Apneic episodes are not present. Epworth Sleepiness Score is 6.  3. Type 2 diabetes mellitus with other specified complication, without long-term current use of insulin (HCC) Medications reviewed. Diabetic ROS: no polyuria or polydipsia, no chest pain, dyspnea or TIA's, no numbness.  She was diagnosed 15-20 years ago.  Her metformin dose was recently increased.  She does not check her blood sugar at home and says she was never told she needs to.  "A1c was 7.0 around 2-3 months ago" per pt.    Lab Results  Component Value Date   HGBA1C 7.3 (H) 11/14/2019   HGBA1C 6.7 (H) 06/08/2018   HGBA1C 6.9 (H) 03/30/2017   Lab Results  Component Value Date   MICROALBUR 51.9 (H) 10/17/2013   LDLCALC 59 11/10/2019   CREATININE 0.93 11/14/2019   Lab Results  Component Value Date   INSULIN 20.1 11/14/2019   INSULIN 15.4 03/30/2017   4. Hypertension associated with diabetes (HCC) Review: taking medications as instructed, no medication side effects noted, no chest pain on exertion, no dyspnea on exertion, no swelling of ankles.  She is treated by Cardiology.  Blood pressure is at goal.  Denies new symptoms or concerns.  BP Readings from Last 3 Encounters:  11/14/19 130/72  09/04/19 130/65  08/24/19  (!) 132/56   5. Hyperlipidemia associated with type 2 diabetes mellitus (HCC) Haley Wilcox has hyperlipidemia and has been trying to improve her cholesterol levels with intensive lifestyle modification including a low saturated fat diet, exercise and weight loss. She denies any chest pain, claudication or myalgias.  Treated by Cardiology.  Lab Results  Component Value Date   ALT 12 11/14/2019   AST 15 11/14/2019   ALKPHOS 72 11/14/2019   BILITOT 0.3 11/14/2019   Lab Results  Component Value Date   CHOL 129 11/10/2019   HDL 51 11/10/2019   LDLCALC 59 11/10/2019   LDLDIRECT 140.8 10/07/2012   TRIG 100 11/10/2019   CHOLHDL 2.5 11/10/2019   6. Hypothyroidism, unspecified type Haley Wilcox is taking levothyroxine 25 mcg daily.  No concerns or new complaints.   Lab Results  Component Value Date   TSH 3.030 11/14/2019   7. Atrial fibrillation, unspecified type (HCC) Stable with no new symptoms.  8. Gout, unspecified cause, unspecified chronicity, unspecified site Denies current symptoms.  9. Memory deficits Cereniti says these have been gradual ever since she had a CVA.  10. Neuropathy She has neuropathy in bilateral fingers and toes.  11. Depression screening [name] was screened for depression as part of her new patient workup.  PHQ-9 is 2.  Assessment/Plan:   1. SOB (shortness of breath) on exertion Haley Wilcox does feel that she gets out of breath more easily that she used to when she exercises. Haley Wilcox's shortness of breath appears to be obesity related and exercise induced. She has agreed to work on weight loss and gradually increase exercise to treat her exercise induced shortness of breath. Will continue to monitor closely.  2. Other fatigue Haley Wilcox does feel that her weight is causing her energy to be lower than it should be. Fatigue may be related to obesity, depression or many other causes. Labs will be ordered, and in the meanwhile, Haley Wilcox will focus on self care including  making healthy food choices, increasing physical activity and focusing on stress reduction.  - EKG 12-Lead - Vitamin B12 - CBC with Differential/Platelet - Comprehensive metabolic panel - Folate - VITAMIN D 25 Hydroxy (Vit-D Deficiency, Fractures)  3. Type 2 diabetes mellitus with other specified complication, without long-term current use of insulin (HCC) - A1c most recently is at goal. Pt will continue current treatment regimen   - Counseled patient on pathophysiology of disease and discussed various treatment options, which always includes dietary and lifestyle modification as first line.    - Importance of a healthy diet discussed with patient in addition to regular aerobic to an eventual goal of exercise of 5d/week or more.   - Check FBS and 2 hours after the biggest meal of your day.  Keep log and bring in next OV for my review.    - Also told patient if you ever feel poorly, please check your blood pressure and blood sugar, as one or the other could be the cause of your symptoms. - We will continue to monitor  - Hemoglobin A1c -  Insulin, random - Blood Glucose Monitoring Suppl KIT; Check FBS daily  Dispense: 1 kit; Refill: 0  4. Hypertension associated with diabetes ([place]) Enslie is working on healthy weight loss and exercise to improve blood pressure control. We will watch for signs of hypotension as she continues her lifestyle modifications.  Blood pressure is at goal. Continue medications per Cardiology.  Will monitor closely.  Check labs, prudent nutritional plan, weight loss.  - CBC with Differential/Platelet - Comprehensive metabolic panel  5. Hyperlipidemia associated with type 2 diabetes mellitus ([place]) Cardiovascular risk and specific lipid/LDL goals reviewed.  We discussed several lifestyle modifications today and Jalynne will continue to work on diet, exercise and weight loss efforts. Orders and follow up as documented in patient record. Recently had cholesterol  panel via Cardiology after review of chart.  Treatment with Cardiology/PCP, prudent nutritional plan, weight loss.  Counseling Intensive lifestyle modifications are the first line treatment for this issue. . Dietary changes: Increase soluble fiber. Decrease simple carbohydrates. . Exercise changes: Moderate to vigorous-intensity aerobic activity 150 minutes per week if tolerated. . Lipid-lowering medications: see documented in medical record.  6. Hypothyroidism, unspecified type Patient with long-standing hypothyroidism, on levothyroxine therapy. She appears euthyroid. Orders and follow up as documented in patient record.  Check labs, prudent nutritional plan, weight loss.  Counseling . Good thyroid control is important for overall health. Supratherapeutic thyroid levels are dangerous and will not improve weight loss results. . The correct way to take levothyroxine is fasting, with water, separated by at least 30 minutes from breakfast, and separated by more than 4 hours from calcium, iron, multivitamins, acid reflux medications (PPIs).  .  - T3 - T4 - TSH  7. Atrial fibrillation, unspecified type [facility]) Management per Cardiology.  Stable currently, w/o concerns or complaints. - F/up with them as instructed or sooner if concerns arise  8. Gout, unspecified cause, unspecified chronicity, unspecified site Check labs, avoid triggers. - Comprehensive metabolic panel  9. Memory deficits Check B12, folate, continue follow-up with Neurology as scheduled.  - Vitamin B12 - Folate  10. Neuropathy Check B12, folate, continue follow-up with Neurology for h/o CVA etc.  - Vitamin B12 - Folate  11. Depression screening Depression screening is negative.  12. Class 1 obesity with serious comorbidity and body mass index (BMI) of 30.0 to 30.9 in adult, unspecified obesity type Lacheryl is currently in the action stage of change and her goal is to continue with weight loss efforts. I recommend  Cash begin the structured treatment plan as follows:  She has agreed to the [employer].  Exercise goals: As is.   Behavioral modification strategies: increasing lean protein intake, decreasing simple carbohydrates, increasing vegetables, decreasing liquid calories, meal planning and cooking strategies and planning for success.  She was informed of the importance of frequent follow-up visits to maximize her success with intensive lifestyle modifications for her multiple health conditions. She was informed we would discuss her lab results at her next visit unless there is a critical issue that needs to be addressed sooner. Veverly agreed to keep her next visit at the agreed upon time to discuss these results.  Objective:   Blood pressure 130/72, pulse 63, temperature 98.3 F (36.8 C), height $RemoveBe'5\' 3"'EOSJxQnSm$  (1.6 m), weight 171 lb (77.6 kg), SpO2 99 %. Body mass index is 30.29 kg/m.  EKG: Normal sinus rhythm, rate 66 bpm.  Indirect Calorimeter completed today shows a VO2 of 269 and a REE of 1870.  Her calculated basal metabolic rate  is 1384 thus her basal metabolic rate is better than expected.  General: Cooperative, alert, well developed, in no acute distress. HEENT: Conjunctivae and lids unremarkable. Cardiovascular: Regular rhythm.  Lungs: Normal work of breathing. Neurologic: No focal deficits.   Lab Results  Component Value Date   CREATININE 0.93 11/14/2019   BUN 20 11/14/2019   NA 142 11/14/2019   K 4.7 11/14/2019   CL 103 11/14/2019   CO2 26 11/14/2019   Lab Results  Component Value Date   ALT 12 11/14/2019   AST 15 11/14/2019   ALKPHOS 72 11/14/2019   BILITOT 0.3 11/14/2019   Lab Results  Component Value Date   HGBA1C 7.3 (H) 11/14/2019   HGBA1C 6.7 (H) 06/08/2018   HGBA1C 6.9 (H) 03/30/2017   HGBA1C 6.5 04/16/2014   HGBA1C 6.2 10/17/2013   Lab Results  Component Value Date   INSULIN 20.1 11/14/2019   INSULIN 15.4 03/30/2017   Lab Results  Component Value  Date   TSH 3.030 11/14/2019   Lab Results  Component Value Date   CHOL 129 11/10/2019   HDL 51 11/10/2019   LDLCALC 59 11/10/2019   LDLDIRECT 140.8 10/07/2012   TRIG 100 11/10/2019   CHOLHDL 2.5 11/10/2019   Lab Results  Component Value Date   WBC 9.0 11/14/2019   HGB 12.8 11/14/2019   HCT 40.7 11/14/2019   MCV 86 11/14/2019   PLT 213 11/14/2019   Lab Results  Component Value Date   IRON 51 10/07/2012   TIBC 192 (L) 06/03/2012   FERRITIN 114 06/03/2012   Obesity Behavioral Intervention Visit Documentation for Insurance:   Approximately 15 minutes were spent on the discussion below.  ASK: We discussed the diagnosis of obesity with [place] today and Latrecia agreed to give [place] permission to discuss obesity behavioral modification therapy today.  ASSESS: Darcus has the diagnosis of obesity and her BMI today is 30.4. Hermina is in the action stage of change.   ADVISE: Lawson was educated on the multiple health risks of obesity as well as the benefit of weight loss to improve her health. She was advised of the need for long term treatment and the importance of lifestyle modifications to improve her current health and to decrease her risk of future health problems.  AGREE: Multiple dietary modification options and treatment options were discussed and Nirel agreed to follow the recommendations documented in the above note.  ARRANGE: Anslee was educated on the importance of frequent visits to treat obesity as outlined per CMS and USPSTF guidelines and agreed to schedule her next follow up appointment today.  Attestation Statements:   Reviewed by clinician on day of visit: allergies, medications, problem list, medical history, surgical history, family history, social history, and previous encounter notes.  I, Water quality scientist, CMA, am acting as Location manager for [employer], DO.  I have reviewed the above documentation for accuracy and completeness, and I agree with the  above. Mellody Dance, DO

## 2019-11-22 ENCOUNTER — Telehealth (INDEPENDENT_AMBULATORY_CARE_PROVIDER_SITE_OTHER): Payer: Self-pay

## 2019-11-22 NOTE — Telephone Encounter (Signed)
Fax from pt's mail order and regular pharmacy, blood glucose monitor not covered her insurance. Call to pharmacy, pharmacy states that nothing is covered, they tried to run different brands with no success.  Will attempt to call pt insurance to see what is covered.

## 2019-11-24 ENCOUNTER — Other Ambulatory Visit: Payer: Self-pay

## 2019-11-24 ENCOUNTER — Encounter: Payer: Self-pay | Admitting: Cardiology

## 2019-11-24 ENCOUNTER — Ambulatory Visit: Payer: Medicare Other | Admitting: Cardiology

## 2019-11-24 VITALS — BP 120/62 | HR 65 | Resp 15 | Ht 63.0 in | Wt 172.0 lb

## 2019-11-24 DIAGNOSIS — E119 Type 2 diabetes mellitus without complications: Secondary | ICD-10-CM

## 2019-11-24 DIAGNOSIS — I4819 Other persistent atrial fibrillation: Secondary | ICD-10-CM

## 2019-11-24 DIAGNOSIS — I10 Essential (primary) hypertension: Secondary | ICD-10-CM | POA: Diagnosis not present

## 2019-11-24 DIAGNOSIS — E782 Mixed hyperlipidemia: Secondary | ICD-10-CM

## 2019-11-24 DIAGNOSIS — I6523 Occlusion and stenosis of bilateral carotid arteries: Secondary | ICD-10-CM | POA: Diagnosis not present

## 2019-11-24 DIAGNOSIS — Z8673 Personal history of transient ischemic attack (TIA), and cerebral infarction without residual deficits: Secondary | ICD-10-CM

## 2019-11-24 NOTE — Progress Notes (Signed)
Subjective:   [name], female    DOB: [birth year], 86 y.o.   MRN: [id number]   Chief complaint:  Atrial fibrillation   HPI   86 y.o. 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  She has unchanged fatigue symptoms, ever since stroke. No new TIA?/stroke symptoms. Reviewed recent labs with the patient. LDL has decreased to 59. Blood pressure is well controlled.   She has an left upper maxillary mass, for which she is going to see a dentist. She wonders if she should stop eliquis in case of any biopsy/surgical procedure may be required.   Current Outpatient Medications on File Prior to Visit  Medication Sig Dispense Refill  . amLODipine (NORVASC) 5 MG tablet Take 1 tablet (5 mg total) by mouth daily. 60 tablet 3  . apixaban (ELIQUIS) 5 MG TABS tablet Take 1 tablet (5 mg total) by mouth 2 (two) times daily. 60 tablet 2  . Blood Glucose Monitoring Suppl KIT Check FBS daily 1 kit 0  . carvedilol (COREG) 12.5 MG tablet Take 12.5 mg by mouth 2 (two) times daily.    . colchicine 0.6 MG tablet Take 0.6 mg by mouth daily.    . diphenhydramine-acetaminophen (TYLENOL PM EXTRA STRENGTH) 25-500 MG TABS tablet Take 1 tablet by mouth at bedtime as needed (sleep).     Marland Kitchen doxazosin (CARDURA) 2 MG tablet Take 2 mg by mouth every evening.     Marland Kitchen levothyroxine (SYNTHROID, LEVOTHROID) 25 MCG tablet Take 25 mcg by mouth daily.    Marland Kitchen losartan (COZAAR) 100 MG tablet Take 1 tablet (100 mg total) by mouth daily. 60 tablet 3  . metFORMIN (GLUCOPHAGE) 500 MG tablet TAKE 1 TABLET DAILY WITH BREAKFAST (Patient taking differently: Take 500 mg by mouth 2 (two) times daily with a meal. ) 90 tablet 2  . Multiple Vitamins-Minerals (MULTIVITAMIN GUMMIES ADULT) CHEW Chew 2 each by mouth daily.    Marland Kitchen omeprazole (PRILOSEC) 20 MG capsule TAKE 1 CAPSULE DAILY AS NEEDED (HEART  BURN) (Patient taking differently: Take 20 mg by mouth daily as needed (heart burn). ) 30 capsule 0  . polyethylene glycol powder (GLYCOLAX/MIRALAX) powder Mix 17gm in 4oz of water.  Start with 2 doses per day, but may take up to 6 doses per day.  Titrate number of doses to allow 2-3 soft bowel movements daily. (Patient taking differently: Take 17 g by mouth daily. Mix 17gm in 4oz of water) 850 g 1  . rosuvastatin (CRESTOR) 10 MG tablet Take 1 tablet (10 mg total) by mouth daily. 90 tablet 2  . spironolactone (ALDACTONE) 25 MG tablet TAKE 1 TABLET DAILY 90 tablet 3   No current facility-administered medications on file prior to visit.    Cardiovascular studies:  EKG 08/24/3019: Atrial fibrillation with slow ventricular response 54 bpm Low voltage in precordial leads.  Nonspecific T-abnormality.   Carotid artery duplex 08/14/2019:  Stenosis in the right internal carotid artery (>=70%). The right PSV internal/common carotid artery ratio of 5.08 is consistent with a stenosis of >70%.  Stenosis in the left internal carotid artery (50-69%).  Antegrade right vertebral artery flow. Antegrade left vertebral artery flow.  Follow up in six months is appropriate if clinically indicated.  Compared to the study done on 06/08/2018, there is progression of disease on right, this correlated with CTA findings of 70% stenosis at the same time. There is progression  of disease in the left ICA from < 30% stenosis.    EKG 02/23/2019: Atrial fibrillation with slow ventricular rate 56 bpm. Low voltage in precordial leads.  Nonspecific T-abnormality.    Echocardiogram 06/08/2018:  1. The left ventricle has normal systolic function with an ejection fraction of 60-65%. The cavity size was normal. There is mildly increased left ventricular wall thickness. Left ventricular diastolic function could not be evaluated secondary to atrial  fibrillation.  2. The right ventricle has normal systolic function. The cavity  was normal. There is no increase in right ventricular wall thickness.  3. Left atrial size was severely dilated.  4. Right atrial size was mildly dilated.  5. The aortic root and ascending aorta are normal in size and structure.  6. The interatrial septum was not assessed.   Recent labs: 11/14/2019: Glucose 136, BUN/Cr 20/0.93. EGFR 56. Na/K 142/4.7. Calcium 10.5. Rest of the CMP normal H/H 12.8/40.7. MCV 86. Platelets 213 HbA1C 7.3% Chol 129, TG 100, HDL 51, LDL 59 TSH 3.0 normal  08/07/2019: Glucose 169, BUN/Cr 20/0.9. eGFR 54. HbA1C 7.5% Chol 123, TG 84, HDL 49, LDL 155 TSH 3.3 normal   Review of Systems  Cardiovascular: Negative for chest pain, dyspnea on exertion, leg swelling, palpitations and syncope.        Body mass index is 30.47 kg/m.   Vitals:   11/24/19 1031  BP: (!) 109/44  Pulse: 72  Resp: 15  SpO2: 95%    Objective:    Physical Exam Vitals and nursing note reviewed.  Constitutional:      General: She is not in acute distress. Neck:     Vascular: No JVD.  Cardiovascular:     Rate and Rhythm: Normal rate. Rhythm irregular.     Pulses: Intact distal pulses.     Heart sounds: No murmur heard.   Pulmonary:     Effort: Pulmonary effort is normal.     Breath sounds: Normal breath sounds. No wheezing or rales.         Assessment & Recommendations:   86 y.o. 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  Fatigue: Multifactorial, stable. I do not think she warrants cardioversion in absence of any other symptoms.   Persistent atrial fibrillation Reasonable to continue rate control strategy. Continue coreg 12.5 mg bid.  CHA2DS2VASc score 7, annual stroke risk 11% Continue eliquis 5 mg bid Given her high stroke risk, I recommend avoiding interruption of anticoagulation. Should eliquis need to be stopped  for dental procedure, I recommend bridging with lovenox.   Hyperlipidemia: LDL down to 59 on rosuvastatin 10 mg  Hypertension Controlled  H/o stroke Stroke in Lt ACA. Minimal neurodeficit.  Progressive >70% stenosis in Rt ICA, without symptoms. Continue Crestor to 10 mg. LDL 59. Not on Aspirin due to ongoing use of eliquis given Afib. Continue f/u w/Neurology.  Carotid stenosis, asymptomatic, bilateral As above.  Type 2 DM: Follow up with PCP.  F/u in 6 months.   Nigel Mormon, MD [facility] Pager: [phone] Office: [phone] If no answer Cell [phone]

## 2019-11-28 ENCOUNTER — Encounter (INDEPENDENT_AMBULATORY_CARE_PROVIDER_SITE_OTHER): Payer: Self-pay | Admitting: Family Medicine

## 2019-11-28 ENCOUNTER — Ambulatory Visit (INDEPENDENT_AMBULATORY_CARE_PROVIDER_SITE_OTHER): Payer: Medicare Other | Admitting: Family Medicine

## 2019-11-28 ENCOUNTER — Other Ambulatory Visit: Payer: Self-pay

## 2019-11-28 VITALS — BP 128/78 | HR 62 | Temp 98.4°F | Ht 63.0 in | Wt 170.0 lb

## 2019-11-28 DIAGNOSIS — E785 Hyperlipidemia, unspecified: Secondary | ICD-10-CM | POA: Diagnosis not present

## 2019-11-28 DIAGNOSIS — E669 Obesity, unspecified: Secondary | ICD-10-CM

## 2019-11-28 DIAGNOSIS — I10 Essential (primary) hypertension: Secondary | ICD-10-CM | POA: Diagnosis not present

## 2019-11-28 DIAGNOSIS — E1159 Type 2 diabetes mellitus with other circulatory complications: Secondary | ICD-10-CM | POA: Diagnosis not present

## 2019-11-28 DIAGNOSIS — Z6830 Body mass index (BMI) 30.0-30.9, adult: Secondary | ICD-10-CM | POA: Diagnosis not present

## 2019-11-28 DIAGNOSIS — E1169 Type 2 diabetes mellitus with other specified complication: Secondary | ICD-10-CM

## 2019-11-28 DIAGNOSIS — I152 Hypertension secondary to endocrine disorders: Secondary | ICD-10-CM

## 2019-11-28 DIAGNOSIS — E559 Vitamin D deficiency, unspecified: Secondary | ICD-10-CM | POA: Diagnosis not present

## 2019-11-28 DIAGNOSIS — E538 Deficiency of other specified B group vitamins: Secondary | ICD-10-CM

## 2019-11-28 DIAGNOSIS — E66811 Obesity, class 1: Secondary | ICD-10-CM

## 2019-11-28 DIAGNOSIS — I6523 Occlusion and stenosis of bilateral carotid arteries: Secondary | ICD-10-CM | POA: Diagnosis not present

## 2019-11-28 MED ORDER — VITAMIN D (ERGOCALCIFEROL) 1.25 MG (50000 UNIT) PO CAPS: 50000.0000 [IU] | ORAL_CAPSULE | ORAL | 0 refills | Status: DC

## 2019-11-29 DIAGNOSIS — C060 Malignant neoplasm of cheek mucosa: Secondary | ICD-10-CM | POA: Diagnosis not present

## 2019-11-29 NOTE — Progress Notes (Signed)
Chief Complaint:   OBESITY Clarke is here to discuss her progress with her obesity treatment plan along with follow-up of her obesity related diagnoses. Langston is on the [employer] and states she is following her eating plan approximately 98% of the time. Reve states she is exercising for 0 minutes 0 times per week.  Today's visit was #: 2 Starting weight: 171 lbs Starting date: 11/14/2019 Today's weight: 170 lbs Today's date: 11/28/2019 Total lbs lost to date: 1 lb Total lbs lost since last in-office visit: 1 lb  Interim History: Tykisha is here for her first follow-up visit today.  She says she finds it pretty easy to follow the plan.  She is not hungry at all, and finds it difficult at times to get through all the foods.  Subjective:   1. Type 2 diabetes mellitus with other specified complication, without long-term current use of insulin ([place]) She did not get the glucometer that we sent in at last visit.  Lab Results  Component Value Date   HGBA1C 7.3 (H) 11/14/2019   HGBA1C 6.7 (H) 06/08/2018   HGBA1C 6.9 (H) 03/30/2017   Lab Results  Component Value Date   MICROALBUR 51.9 (H) 10/17/2013   LDLCALC 59 11/10/2019   CREATININE 0.93 11/14/2019   Lab Results  Component Value Date   INSULIN 20.1 11/14/2019   INSULIN 15.4 03/30/2017   2. Hyperlipidemia associated with type 2 diabetes mellitus ([place]) She is on Crestor with no myalgias or issues.  Lab Results  Component Value Date   ALT 12 11/14/2019   AST 15 11/14/2019   ALKPHOS 72 11/14/2019   BILITOT 0.3 11/14/2019   Lab Results  Component Value Date   CHOL 129 11/10/2019   HDL 51 11/10/2019   LDLCALC 59 11/10/2019   LDLDIRECT 140.8 10/07/2012   TRIG 100 11/10/2019   CHOLHDL 2.5 11/10/2019   3. Hypertension associated with diabetes ([place]) She is taking Norvasc, Coreg, Cardura, Cozaar, and aldactone.  Denies issues with medications.  Denies symptoms or concerns.  BP Readings from Last 3  Encounters:  11/28/19 128/78  11/24/19 120/62  11/14/19 130/72   4. Vitamin B12 deficiency She notes fatigue. She is not a vegetarian.  She does not have a previous diagnosis of pernicious anemia.  She does not have a history of weight loss surgery.   Lab Results  Component Value Date   [id number] 11/14/2019   5. Vitamin D deficiency Geanette's Vitamin D level was 31.8 on 11/14/2019. She is currently taking no vitamin D supplement. She denies nausea, vomiting or muscle weakness.  She has a history of bone thinning.  Assessment/Plan:   1. Type 2 diabetes mellitus with other specified complication, without long-term current use of insulin (HCC) Worsening.  Discussed labs with patient today.  Obtain glucometer and keep blood sugar log.  Check FBS 2-3 times per week and check blood sugar if ever feeling badly or like sugar may be low.  Continue metformin 500 mg twice daily.  2. Hyperlipidemia associated with type 2 diabetes mellitus ([place]) Continue medications per Cardiology.  CMP within normal limits.  Continue /onp and decreasing saturated and trans fats.  3. Hypertension associated with diabetes ([place]) Discussed labs with patient today.  Blood pressure is at goal today.  Continue medications per PCP/Cardiology.  Will continue to closely monitor as she slowly loses weight.  4. Vitamin B12 deficiency Level within normal limits.  Continue multivitamin and prudent nutritional plan that we gave her that  is rich in B12.  5. Vitamin D deficiency Low Vitamin D level contributes to fatigue and are associated with obesity, breast, and colon cancer. She agrees to start to take prescription Vitamin D @50 ,000 IU every week and will follow-up for routine testing of Vitamin D, at least 2-3 times per year to avoid over-replacement.  Vitamin D level is not at goal of >50.  Recheck level in 3 months.  -Start Vitamin D, Ergocalciferol, (DRISDOL) 1.25 MG (50000 UNIT) CAPS capsule; Take 1 capsule (50,000  Units total) by mouth every 7 (seven) days.  Dispense: 4 capsule; Refill: 0  6. Class 1 obesity with serious comorbidity and body mass index (BMI) of 30.0 to 30.9 in adult, unspecified obesity type Kristin is currently in the action stage of change. As such, her goal is to continue with weight loss efforts. She has agreed to the [employer].   Exercise goals: All adults should avoid inactivity. Some physical activity is better than none, and adults who participate in any amount of physical activity gain some health benefits. Slowly walking.  Behavioral modification strategies: increasing lean protein intake, increasing water intake, no skipping meals, better snacking choices and planning for success.  Gracy has agreed to follow-up with our clinic in 2 weeks. She was informed of the importance of frequent follow-up visits to maximize her success with intensive lifestyle modifications for her multiple health conditions.   Objective:   Blood pressure 128/78, pulse 62, temperature 98.4 F (36.9 C), height 5\' 3"  (1.6 m), weight 170 lb (77.1 kg), SpO2 100 %. Body mass index is 30.11 kg/m.  General: Cooperative, alert, well developed, in no acute distress. HEENT: Conjunctivae and lids unremarkable. Cardiovascular: Regular rhythm.  Lungs: Normal work of breathing. Neurologic: No focal deficits.   Lab Results  Component Value Date   CREATININE 0.93 11/14/2019   BUN 20 11/14/2019   NA 142 11/14/2019   K 4.7 11/14/2019   CL 103 11/14/2019   CO2 26 11/14/2019   Lab Results  Component Value Date   ALT 12 11/14/2019   AST 15 11/14/2019   ALKPHOS 72 11/14/2019   BILITOT 0.3 11/14/2019   Lab Results  Component Value Date   HGBA1C 7.3 (H) 11/14/2019   HGBA1C 6.7 (H) 06/08/2018   HGBA1C 6.9 (H) 03/30/2017   HGBA1C 6.5 04/16/2014   HGBA1C 6.2 10/17/2013   Lab Results  Component Value Date   INSULIN 20.1 11/14/2019   INSULIN 15.4 03/30/2017   Lab Results  Component Value Date    TSH 3.030 11/14/2019   Lab Results  Component Value Date   CHOL 129 11/10/2019   HDL 51 11/10/2019   LDLCALC 59 11/10/2019   LDLDIRECT 140.8 10/07/2012   TRIG 100 11/10/2019   CHOLHDL 2.5 11/10/2019   Lab Results  Component Value Date   WBC 9.0 11/14/2019   HGB 12.8 11/14/2019   HCT 40.7 11/14/2019   MCV 86 11/14/2019   PLT 213 11/14/2019   Lab Results  Component Value Date   IRON 51 10/07/2012   TIBC 192 (L) 06/03/2012   FERRITIN 114 06/03/2012   Obesity Behavioral Intervention:   Approximately 15 minutes were spent on the discussion below.  ASK: We discussed the diagnosis of obesity with Ashlie today and Issabelle agreed to give [place] permission to discuss obesity behavioral modification therapy today.  ASSESS: Sharlize has the diagnosis of obesity and her BMI today is 30.2. Jancie is in the action stage of change.   ADVISE: Alejah was educated  on the multiple health risks of obesity as well as the benefit of weight loss to improve her health. She was advised of the need for long term treatment and the importance of lifestyle modifications to improve her current health and to decrease her risk of future health problems.  AGREE: Multiple dietary modification options and treatment options were discussed and Zanylah agreed to follow the recommendations documented in the above note.  ARRANGE: Shequita was educated on the importance of frequent visits to treat obesity as outlined per CMS and USPSTF guidelines and agreed to schedule her next follow up appointment today.  Attestation Statements:   Reviewed by clinician on day of visit: allergies, medications, problem list, medical history, surgical history, family history, social history, and previous encounter notes.  I, Water quality scientist, CMA, am acting as Location manager for [employer], DO.  I have reviewed the above documentation for accuracy and completeness, and I agree with the above. Mellody Dance, DO

## 2019-12-13 ENCOUNTER — Ambulatory Visit (INDEPENDENT_AMBULATORY_CARE_PROVIDER_SITE_OTHER): Payer: Medicare Other | Admitting: Family Medicine

## 2019-12-13 ENCOUNTER — Other Ambulatory Visit: Payer: Self-pay

## 2019-12-13 ENCOUNTER — Encounter (INDEPENDENT_AMBULATORY_CARE_PROVIDER_SITE_OTHER): Payer: Self-pay | Admitting: Family Medicine

## 2019-12-13 VITALS — BP 127/71 | HR 76 | Temp 98.9°F | Ht 63.0 in | Wt 169.0 lb

## 2019-12-13 DIAGNOSIS — Z6830 Body mass index (BMI) 30.0-30.9, adult: Secondary | ICD-10-CM | POA: Diagnosis not present

## 2019-12-13 DIAGNOSIS — I6523 Occlusion and stenosis of bilateral carotid arteries: Secondary | ICD-10-CM

## 2019-12-13 DIAGNOSIS — E1169 Type 2 diabetes mellitus with other specified complication: Secondary | ICD-10-CM | POA: Diagnosis not present

## 2019-12-13 DIAGNOSIS — E669 Obesity, unspecified: Secondary | ICD-10-CM

## 2019-12-17 NOTE — Progress Notes (Signed)
Chief Complaint:   OBESITY Kam is here to discuss her progress with her obesity treatment plan along with follow-up of her obesity related diagnoses. Ellianah is on the [employer] with chicken and states she is following her eating plan approximately 80% of the time. Adonis states she is walking for 20 minutes 3 times per week.  Today's visit was #: 3 Starting weight: 171 lbs Starting date: 11/14/2019 Today's weight: 169 lbs Today's date: 12/13/2019 Total lbs lost to date: 2 lbs Total lbs lost since last in-office visit: 2 lbs  Interim History:  Lillan says she is "bored with it".   She eats a [employer] biscuit 3 days per week for breakfast instead of eggs.  For her daughter's 50th birthday yesterday, she went to [facility] and had a steak, broccoli, and a bleu cheese salad.  She also likes cornbread and [place] crackers which she has often.   She also ate some cookies at church (4 of them or so).     However, she does not wish to change meal plans and states overall works well for her.     Subjective:   1. Type 2 diabetes mellitus with other specified complication, without long-term current use of insulin (HCC) Medications reviewed. Diabetic ROS: no polyuria or polydipsia, no chest pain, dyspnea or TIA's, no numbness, tingling or pain in extremities.  Shaneca says she got a glucometer.  FBS 102, 123.  No lows or symptoms.  She is taking metformin 500 mg twice daily.  No side effects or concerns.   Lab Results  Component Value Date   HGBA1C 7.3 (H) 11/14/2019   HGBA1C 6.7 (H) 06/08/2018   HGBA1C 6.9 (H) 03/30/2017   Lab Results  Component Value Date   MICROALBUR 51.9 (H) 10/17/2013   LDLCALC 59 11/10/2019   CREATININE 0.93 11/14/2019   Lab Results  Component Value Date   INSULIN 20.1 11/14/2019   INSULIN 15.4 03/30/2017   Assessment/Plan:   1. Type 2 diabetes mellitus with other specified complication, without long-term current use of insulin (HCC) Good  blood sugar control is important to decrease the likelihood of diabetic complications such as nephropathy, neuropathy, limb loss, blindness, coronary artery disease, and death. Intensive lifestyle modification including diet, exercise and weight loss are the first line of treatment for diabetes.  Continue metformin, check FBS and 2 hour postprandial.  Continue weight loss via prudent nutritional plan with decreasing simple carbs.    2. Class 1 obesity with serious comorbidity and body mass index (BMI) of 30.0 to 30.9 in adult, unspecified obesity type Amran is currently in the action stage of change. As such, her goal is to continue with weight loss efforts. She has agreed to the [employer] with Category 1/2 breakfast options given to pt today.   Exercise goals: If she can increase to 20 minutes per day, she will do it.  Behavioral modification strategies: increasing lean protein intake, decreasing simple carbohydrates, meal planning and cooking strategies, better snacking choices and planning for success.  Verlena has agreed to follow-up with our clinic in 2 weeks. She was informed of the importance of frequent follow-up visits to maximize her success with intensive lifestyle modifications for her multiple health conditions.   Objective:   Blood pressure 127/71, pulse 76, temperature 98.9 F (37.2 C), height 5\' 3"  (1.6 m), weight 169 lb (76.7 kg), SpO2 97 %. Body mass index is 29.94 kg/m.  General: Cooperative, alert, well developed, in no acute distress. HEENT:  Conjunctivae and lids unremarkable. Cardiovascular: Regular rhythm.  Lungs: Normal work of breathing. Neurologic: No focal deficits.   Lab Results  Component Value Date   CREATININE 0.93 11/14/2019   BUN 20 11/14/2019   NA 142 11/14/2019   K 4.7 11/14/2019   CL 103 11/14/2019   CO2 26 11/14/2019   Lab Results  Component Value Date   ALT 12 11/14/2019   AST 15 11/14/2019   ALKPHOS 72 11/14/2019   BILITOT 0.3  11/14/2019   Lab Results  Component Value Date   HGBA1C 7.3 (H) 11/14/2019   HGBA1C 6.7 (H) 06/08/2018   HGBA1C 6.9 (H) 03/30/2017   HGBA1C 6.5 04/16/2014   HGBA1C 6.2 10/17/2013   Lab Results  Component Value Date   INSULIN 20.1 11/14/2019   INSULIN 15.4 03/30/2017   Lab Results  Component Value Date   TSH 3.030 11/14/2019   Lab Results  Component Value Date   CHOL 129 11/10/2019   HDL 51 11/10/2019   LDLCALC 59 11/10/2019   LDLDIRECT 140.8 10/07/2012   TRIG 100 11/10/2019   CHOLHDL 2.5 11/10/2019   Lab Results  Component Value Date   WBC 9.0 11/14/2019   HGB 12.8 11/14/2019   HCT 40.7 11/14/2019   MCV 86 11/14/2019   PLT 213 11/14/2019   Lab Results  Component Value Date   IRON 51 10/07/2012   TIBC 192 (L) 06/03/2012   FERRITIN 114 06/03/2012   Obesity Behavioral Intervention:   Approximately 15 minutes were spent on the discussion below.  ASK: We discussed the diagnosis of obesity with Barby today and Belem agreed to give [place] permission to discuss obesity behavioral modification therapy today.  ASSESS: Soniya has the diagnosis of obesity and her BMI today is 30.1. Adream is in the action stage of change.   ADVISE: Adyson was educated on the multiple health risks of obesity as well as the benefit of weight loss to improve her health. She was advised of the need for long term treatment and the importance of lifestyle modifications to improve her current health and to decrease her risk of future health problems.  AGREE: Multiple dietary modification options and treatment options were discussed and Prabhnoor agreed to follow the recommendations documented in the above note.  ARRANGE: Kylie was educated on the importance of frequent visits to treat obesity as outlined per CMS and USPSTF guidelines and agreed to schedule her next follow up appointment today.  Attestation Statements:   Reviewed by clinician on day of visit: allergies, medications,  problem list, medical history, surgical history, family history, social history, and previous encounter notes.  I, Water quality scientist, CMA, am acting as Location manager for [employer], DO.  I have reviewed the above documentation for accuracy and completeness, and I agree with the above. Mellody Dance, DO

## 2019-12-19 DIAGNOSIS — C060 Malignant neoplasm of cheek mucosa: Secondary | ICD-10-CM | POA: Diagnosis not present

## 2019-12-19 DIAGNOSIS — D479 Neoplasm of uncertain behavior of lymphoid, hematopoietic and related tissue, unspecified: Secondary | ICD-10-CM | POA: Diagnosis not present

## 2019-12-19 DIAGNOSIS — K1370 Unspecified lesions of oral mucosa: Secondary | ICD-10-CM | POA: Diagnosis not present

## 2019-12-27 ENCOUNTER — Encounter (INDEPENDENT_AMBULATORY_CARE_PROVIDER_SITE_OTHER): Payer: Self-pay | Admitting: Family Medicine

## 2019-12-27 ENCOUNTER — Other Ambulatory Visit: Payer: Self-pay

## 2019-12-27 ENCOUNTER — Ambulatory Visit (INDEPENDENT_AMBULATORY_CARE_PROVIDER_SITE_OTHER): Payer: Medicare Other | Admitting: Family Medicine

## 2019-12-27 VITALS — BP 156/73 | HR 69 | Temp 98.5°F | Ht 63.0 in | Wt 167.0 lb

## 2019-12-27 DIAGNOSIS — I152 Hypertension secondary to endocrine disorders: Secondary | ICD-10-CM

## 2019-12-27 DIAGNOSIS — E1169 Type 2 diabetes mellitus with other specified complication: Secondary | ICD-10-CM

## 2019-12-27 DIAGNOSIS — I6523 Occlusion and stenosis of bilateral carotid arteries: Secondary | ICD-10-CM | POA: Diagnosis not present

## 2019-12-27 DIAGNOSIS — C410 Malignant neoplasm of bones of skull and face: Secondary | ICD-10-CM | POA: Diagnosis not present

## 2019-12-27 DIAGNOSIS — E669 Obesity, unspecified: Secondary | ICD-10-CM

## 2019-12-27 DIAGNOSIS — E1159 Type 2 diabetes mellitus with other circulatory complications: Secondary | ICD-10-CM

## 2019-12-27 DIAGNOSIS — Z6830 Body mass index (BMI) 30.0-30.9, adult: Secondary | ICD-10-CM

## 2019-12-28 NOTE — Progress Notes (Signed)
Chief Complaint:   OBESITY Makenna is here to discuss her progress with her obesity treatment plan along with follow-up of her obesity related diagnoses. Tamirah is on the [employer] along with Cat 1/2 breakfast options and states she is following her eating plan approximately 75% of the time. Ricky states she is exercising for 0 minutes 0 times per week.  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  She ate less and had soft foods - soups, mashed vegetables, took smaller bites. Unable to follow meal plan and had dec intake.   She is doing well overall.  - She loves tuna fish, which was bearable to eat.  She finds out about her biopsy results in around 10 days or so.    The change in plan from last OV ( [employer] plan with the Cat 1/2 breakfast options)  works well for her and she enjoys it.    Assessment/Plan:   1. Hypertension associated with diabetes ([place]) Medications: Norvasc 5 mg daily, Coreg 12.5 mg twice daily, Cardura 2 mg at night, Cozaar 100 mg daily.  At home, her blood pressure runs 120s/68-70.  It is never 150-160s on top per pt  Plan:  BP at goal of less than 140/90 upon recheck today in office.  Stable at home as well.  Continue medications per Cardiology.  Will monitor and she will continue home blood pressure monitoring. We will closely monitor for S/Sx hypotension with continued weight loss  BP Readings from Last 3 Encounters:  12/27/19 (!) 156/73  12/13/19 127/71  11/28/19 128/78   Lab Results  Component Value Date   CREATININE 0.93 11/14/2019     2. Type 2 diabetes mellitus with other specified complication, without long-term current use of insulin  (HCC) Diabetes Mellitus: stable. Medication: Metformin 500 mg twice daily and tolerating well. Issues reviewed with her: blood sugar goals, complications of diabetes mellitus, hypoglycemia prevention and treatment, exercise, nutrition, and carbohydrate counting.  FBS 103, 112, 123.  Two-hour postprandial 96 after fish and salad.    Plan:  Blood sugars are at goal- A1c at goal for age.  Continue medications.  Continue monitoring closely.  F/up PCP for routine eye, foot etc screenings    Lab Results  Component Value Date   HGBA1C 7.3 (H) 11/14/2019   HGBA1C 6.7 (H) 06/08/2018   HGBA1C 6.9 (H) 03/30/2017   Lab Results  Component Value Date   MICROALBUR 51.9 (H) 10/17/2013   LDLCALC 59 11/10/2019   CREATININE 0.93 11/14/2019     3. Class 1 obesity with serious comorbidity and body mass index (BMI) of 30.0 to 30.9 in adult, unspecified obesity type  Tanda is currently in the action stage of change. As such, her goal is to continue with weight loss efforts. She has agreed to continue with the the [place] with Cat 1-2 breakfast options  Exercise goals: Encouraged to walk her dog, Coco, as much as possible as tolerated.  Behavioral modification strategies: meal planning and cooking strategies and planning for success.  Joanne has agreed to follow-up with our clinic in 2-3 weeks. She was informed of the importance of frequent follow-up visits to maximize  her success with intensive lifestyle modifications for her multiple health conditions.     Objective:   Blood pressure (!) 156/73, pulse 69, temperature 98.5 F (36.9 C), height 5\' 3"  (1.6 m), weight 167 lb (75.8 kg), SpO2 95 %. Body mass index is 29.58 kg/m.  General: Cooperative, alert, well developed, in no acute distress. HEENT: Conjunctivae and lids unremarkable. Cardiovascular: Regular rhythm.  Lungs: Normal work of breathing. Neurologic: No focal deficits.   Lab Results  Component Value Date   CREATININE 0.93  11/14/2019   BUN 20 11/14/2019   NA 142 11/14/2019   K 4.7 11/14/2019   CL 103 11/14/2019   CO2 26 11/14/2019   Lab Results  Component Value Date   ALT 12 11/14/2019   AST 15 11/14/2019   ALKPHOS 72 11/14/2019   BILITOT 0.3 11/14/2019   Lab Results  Component Value Date   HGBA1C 7.3 (H) 11/14/2019   HGBA1C 6.7 (H) 06/08/2018   HGBA1C 6.9 (H) 03/30/2017   HGBA1C 6.5 04/16/2014   HGBA1C 6.2 10/17/2013   Lab Results  Component Value Date   INSULIN 20.1 11/14/2019   INSULIN 15.4 03/30/2017   Lab Results  Component Value Date   TSH 3.030 11/14/2019   Lab Results  Component Value Date   CHOL 129 11/10/2019   HDL 51 11/10/2019   LDLCALC 59 11/10/2019   LDLDIRECT 140.8 10/07/2012   TRIG 100 11/10/2019   CHOLHDL 2.5 11/10/2019   Lab Results  Component Value Date   WBC 9.0 11/14/2019   HGB 12.8 11/14/2019   HCT 40.7 11/14/2019   MCV 86 11/14/2019   PLT 213 11/14/2019   Lab Results  Component Value Date   IRON 51 10/07/2012   TIBC 192 (L) 06/03/2012   FERRITIN 114 06/03/2012    Obesity Behavioral Intervention:   Approximately 15 minutes were spent on the discussion below.  ASK: We discussed the diagnosis of obesity with Landa today and Laporsha agreed to give [place] permission to discuss obesity behavioral modification therapy today.  ASSESS: Peytyn has the diagnosis of obesity and her BMI today is 29.7. Mariadel is in the action stage of change.   ADVISE: Arneda was educated on the multiple health risks of obesity as well as the benefit of weight loss to improve her health. She was advised of the need for long term treatment and the importance of lifestyle modifications to improve her current health and to decrease her risk of future health problems.  AGREE: Multiple dietary modification options and treatment options were discussed and Davionna agreed to follow the recommendations documented in the above note.  ARRANGE: Kameron was educated on the importance  of frequent visits to treat obesity as outlined per CMS and USPSTF guidelines and agreed to schedule her next follow up appointment today.  Attestation Statements:   Reviewed by clinician on day of visit: allergies, medications, problem list, medical history, surgical history, family history, social history, and previous encounter notes.  I, Water quality scientist, CMA, am acting as Location manager for [employer], DO.  I have reviewed the above documentation for accuracy and completeness, and I agree with the above. Mellody Dance, DO

## 2020-01-04 DIAGNOSIS — C8591 Non-Hodgkin lymphoma, unspecified, lymph nodes of head, face, and neck: Secondary | ICD-10-CM | POA: Diagnosis not present

## 2020-01-04 DIAGNOSIS — R220 Localized swelling, mass and lump, head: Secondary | ICD-10-CM | POA: Diagnosis not present

## 2020-01-06 ENCOUNTER — Other Ambulatory Visit: Payer: Self-pay

## 2020-01-06 ENCOUNTER — Ambulatory Visit: Payer: Medicare Other | Attending: Internal Medicine

## 2020-01-06 DIAGNOSIS — Z23 Encounter for immunization: Secondary | ICD-10-CM

## 2020-01-06 NOTE — Progress Notes (Signed)
   Covid-19 Vaccination Clinic  Name:  Haley Wilcox    MRN: [id number] DOB: 07-04-1931  01/06/2020  Haley Wilcox was observed post Covid-19 immunization for 15 minutes without incident. She was provided with Vaccine Information Sheet and instruction to access the V-Safe system.   Haley Wilcox was instructed to call 911 with any severe reactions post vaccine: Marland Kitchen Difficulty breathing  . Swelling of face and throat  . A fast heartbeat  . A bad rash all over body  . Dizziness and weakness

## 2020-01-08 ENCOUNTER — Telehealth: Payer: Self-pay | Admitting: Hematology and Oncology

## 2020-01-08 NOTE — Telephone Encounter (Signed)
Scheduled appointment per 10/15 new patient referral. Spoke to patient who is aware of appointment date and time.  

## 2020-01-09 ENCOUNTER — Other Ambulatory Visit: Payer: Self-pay | Admitting: Internal Medicine

## 2020-01-09 ENCOUNTER — Other Ambulatory Visit (HOSPITAL_COMMUNITY): Payer: Self-pay | Admitting: Internal Medicine

## 2020-01-09 DIAGNOSIS — R220 Localized swelling, mass and lump, head: Secondary | ICD-10-CM

## 2020-01-09 DIAGNOSIS — C8591 Non-Hodgkin lymphoma, unspecified, lymph nodes of head, face, and neck: Secondary | ICD-10-CM

## 2020-01-10 ENCOUNTER — Other Ambulatory Visit (HOSPITAL_COMMUNITY): Payer: Self-pay | Admitting: Internal Medicine

## 2020-01-10 DIAGNOSIS — C8591 Non-Hodgkin lymphoma, unspecified, lymph nodes of head, face, and neck: Secondary | ICD-10-CM

## 2020-01-10 DIAGNOSIS — R220 Localized swelling, mass and lump, head: Secondary | ICD-10-CM

## 2020-01-15 ENCOUNTER — Encounter (INDEPENDENT_AMBULATORY_CARE_PROVIDER_SITE_OTHER): Payer: Self-pay | Admitting: Family Medicine

## 2020-01-15 ENCOUNTER — Ambulatory Visit (INDEPENDENT_AMBULATORY_CARE_PROVIDER_SITE_OTHER): Payer: Medicare Other | Admitting: Family Medicine

## 2020-01-15 ENCOUNTER — Telehealth: Payer: Self-pay

## 2020-01-15 ENCOUNTER — Other Ambulatory Visit: Payer: Self-pay

## 2020-01-15 VITALS — BP 146/73 | HR 57 | Temp 98.1°F | Ht 63.0 in | Wt 167.0 lb

## 2020-01-15 DIAGNOSIS — E1159 Type 2 diabetes mellitus with other circulatory complications: Secondary | ICD-10-CM

## 2020-01-15 DIAGNOSIS — E559 Vitamin D deficiency, unspecified: Secondary | ICD-10-CM | POA: Diagnosis not present

## 2020-01-15 DIAGNOSIS — I152 Hypertension secondary to endocrine disorders: Secondary | ICD-10-CM

## 2020-01-15 DIAGNOSIS — I6523 Occlusion and stenosis of bilateral carotid arteries: Secondary | ICD-10-CM

## 2020-01-15 DIAGNOSIS — E669 Obesity, unspecified: Secondary | ICD-10-CM

## 2020-01-15 DIAGNOSIS — Z6830 Body mass index (BMI) 30.0-30.9, adult: Secondary | ICD-10-CM | POA: Diagnosis not present

## 2020-01-15 MED ORDER — VITAMIN D (ERGOCALCIFEROL) 1.25 MG (50000 UNIT) PO CAPS: 50000.0000 [IU] | ORAL_CAPSULE | ORAL | 0 refills | Status: DC

## 2020-01-15 NOTE — Telephone Encounter (Signed)
Telephone encounter:  Reason for call: Hypertension, pt says her BP has been consistently high since October 16. High 147/82, 142/ 77 her pulse has been in the 70's-80's. She said that you told her to call if this happened.   Usual provider: MP  Last office visit: 11/24/19  Next office visit: 05/23/20   Last hospitalization: NA  Current Outpatient Medications on File Prior to Visit  Medication Sig Dispense Refill  . amLODipine (NORVASC) 5 MG tablet Take 1 tablet (5 mg total) by mouth daily. 60 tablet 3  . apixaban (ELIQUIS) 5 MG TABS tablet Take 1 tablet (5 mg total) by mouth 2 (two) times daily. 60 tablet 2  . carvedilol (COREG) 12.5 MG tablet Take 12.5 mg by mouth 2 (two) times daily.    . colchicine 0.6 MG tablet Take 0.6 mg by mouth as needed.     . diphenhydramine-acetaminophen (TYLENOL PM EXTRA STRENGTH) 25-500 MG TABS tablet Take 1 tablet by mouth at bedtime as needed (sleep).     Marland Kitchen doxazosin (CARDURA) 2 MG tablet Take 2 mg by mouth every evening.     Marland Kitchen levothyroxine (SYNTHROID, LEVOTHROID) 25 MCG tablet Take 25 mcg by mouth daily.    Marland Kitchen losartan (COZAAR) 100 MG tablet Take 1 tablet (100 mg total) by mouth daily. 60 tablet 3  . metFORMIN (GLUCOPHAGE) 500 MG tablet Take by mouth 2 (two) times daily with a meal.    . Multiple Vitamins-Minerals (MULTIVITAMIN GUMMIES ADULT) CHEW Chew 2 each by mouth daily.    Marland Kitchen omeprazole (PRILOSEC) 20 MG capsule TAKE 1 CAPSULE DAILY AS NEEDED (HEART BURN) (Patient taking differently: Take 20 mg by mouth as needed (heart burn). ) 30 capsule 0  . polyethylene glycol powder (GLYCOLAX/MIRALAX) powder Mix 17gm in 4oz of water.  Start with 2 doses per day, but may take up to 6 doses per day.  Titrate number of doses to allow 2-3 soft bowel movements daily. (Patient taking differently: Take 17 g by mouth daily. Mix 17gm in 4oz of water) 850 g 1  . rosuvastatin (CRESTOR) 10 MG tablet Take 1 tablet (10 mg total) by mouth daily. 90 tablet 2  . spironolactone  (ALDACTONE) 25 MG tablet TAKE 1 TABLET DAILY 90 tablet 3  . Vitamin D, Ergocalciferol, (DRISDOL) 1.25 MG (50000 UNIT) CAPS capsule Take 1 capsule (50,000 Units total) by mouth every 7 (seven) days. 4 capsule 0   No current facility-administered medications on file prior to visit.

## 2020-01-15 NOTE — Telephone Encounter (Signed)
Fu has been sch for 11/11 at 3pm - @[facility] , can you confirm this with patient when confirming medications pls.   -Leda Quail

## 2020-01-15 NOTE — Telephone Encounter (Signed)
Called pt to inform her about her medication. Pt will come on 02/01/20

## 2020-01-15 NOTE — Telephone Encounter (Signed)
Okay yo increase amlodipine 5 mg to 2 tablets a day. Can schedule a f/u w/me in 2-3 weeks.  Thanks MJP

## 2020-01-17 NOTE — Progress Notes (Signed)
Chief Complaint:   OBESITY Ambriella is here to discuss her progress with her obesity treatment plan along with follow-up of her obesity related diagnoses. Mallery is on the [employer] and states she is following her eating plan approximately 85% of the time. Hasna states she is walking from store to store for 60-120 minutes 3 times per week.  Today's visit was #: 5 Starting weight: 171 lbs Starting date: 11/14/2019 Today's weight: 167 lbs Today's date: 01/15/2020 Total lbs lost to date: 4 lbs Total lbs lost since last in-office visit: 0 Total weight loss percentage to date: -2.34%  Interim History: Brekyn recently found out the facial mass biopsy done is actually non-Hodgkin's lymphoma.  She has an appointment with Oncology on 01/25/2020 and will be getting multiple CT scans over the next couple of days.  She is still avoiding excessive chewing due to continued numbness on the left side of her face/mouth.  Last appointment was on 12/27/2019.  Assessment/Plan:   1. Vitamin D deficiency Ariele S Garmon has a history of Vitamin D deficiency with resultant generalized fatigue as her primary symptom.  she is taking vitamin D 50,000 IU weekly for this deficiency and tolerating it well without side-effect.  Most recent Vitamin D lab reviewed-  level: 31.8.  Plan:   - Discussed importance of vitamin D (as well as calcium) to their health and wellbeing.   - We reviewed possible symptoms of low Vitamin D including low energy, depressed mood, muscle aches, joint aches, osteoporosis, etc.  - We discussed that low Vitamin D levels may be linked to an increased risk of cardiovascular events, and even increased risk of cancers, such as colon and breast.   - weight loss will likely improve availability of vitamin D, thus encouraged   - Informed patient this may be a lifelong thing, and she was encouraged to continue to take the medicine until told otherwise.    We will need to monitor  levels regularly (every 3-4 mo on average) to keep levels within normal limits.   -Refill Vitamin D, Ergocalciferol, (DRISDOL) 1.25 MG (50000 UNIT) CAPS capsule; Take 1 capsule (50,000 Units total) by mouth every 7 (seven) days.  Dispense: 4 capsule; Refill: 0  2. Hypertension associated with type 2 diabetes mellitus ([place]) Blood pressure at home is 130s/70s (controlled and checks daily).  Her cardiologist cut her amlodipine dose in half recently.  She is feeling much better since.  No longer feeling lightheaded.    A1c was 7.3 on 11/14/2019 and at goal.    Plan:  Continue home blood pressure monitoring at home.  Blood pressure has been within normal limits at home.  Almost at goal today.  She is feeling much better now that her blood pressure is a little higher than prior.  BP Readings from Last 3 Encounters:  01/15/20 (!) 146/73  12/27/19 (!) 156/73  12/13/19 127/71   3. Class 1 obesity with serious comorbidity and body mass index (BMI) of 30.0 to 30.9 in adult, unspecified obesity type  Jaquana is currently in the action stage of change. As such, her goal is to continue with weight loss efforts. She has agreed to the [employer].   Exercise goals: As is.  Behavioral modification strategies: decreasing sodium intake and planning for success.  Rashaunda has agreed to follow-up with our clinic in 2 weeks. She was informed of the importance of frequent follow-up visits to maximize her success with intensive lifestyle modifications for her multiple health  conditions.   Objective:   Blood pressure (!) 146/73, pulse (!) 57, temperature 98.1 F (36.7 C), height 5\' 3"  (1.6 m), weight 167 lb (75.8 kg), SpO2 98 %. Body mass index is 29.58 kg/m.  General: Cooperative, alert, well developed, in no acute distress. HEENT: Conjunctivae and lids unremarkable. Cardiovascular: Regular rhythm.  Lungs: Normal work of breathing. Neurologic: No focal deficits.   Lab Results  Component Value Date    CREATININE 0.93 11/14/2019   BUN 20 11/14/2019   NA 142 11/14/2019   K 4.7 11/14/2019   CL 103 11/14/2019   CO2 26 11/14/2019   Lab Results  Component Value Date   ALT 12 11/14/2019   AST 15 11/14/2019   ALKPHOS 72 11/14/2019   BILITOT 0.3 11/14/2019   Lab Results  Component Value Date   HGBA1C 7.3 (H) 11/14/2019   HGBA1C 6.7 (H) 06/08/2018   HGBA1C 6.9 (H) 03/30/2017   HGBA1C 6.5 04/16/2014   HGBA1C 6.2 10/17/2013   Lab Results  Component Value Date   INSULIN 20.1 11/14/2019   INSULIN 15.4 03/30/2017   Lab Results  Component Value Date   TSH 3.030 11/14/2019   Lab Results  Component Value Date   CHOL 129 11/10/2019   HDL 51 11/10/2019   LDLCALC 59 11/10/2019   LDLDIRECT 140.8 10/07/2012   TRIG 100 11/10/2019   CHOLHDL 2.5 11/10/2019   Lab Results  Component Value Date   WBC 9.0 11/14/2019   HGB 12.8 11/14/2019   HCT 40.7 11/14/2019   MCV 86 11/14/2019   PLT 213 11/14/2019   Lab Results  Component Value Date   IRON 51 10/07/2012   TIBC 192 (L) 06/03/2012   FERRITIN 114 06/03/2012   Obesity Behavioral Intervention:   Approximately 15 minutes were spent on the discussion below.  ASK: We discussed the diagnosis of obesity with Lamiracle today and Edana agreed to give [place] permission to discuss obesity behavioral modification therapy today.  ASSESS: Loeta has the diagnosis of obesity and her BMI today is 29.7. Nataliah is in the action stage of change.   ADVISE: Baelyn was educated on the multiple health risks of obesity as well as the benefit of weight loss to improve her health. She was advised of the need for long term treatment and the importance of lifestyle modifications to improve her current health and to decrease her risk of future health problems.  AGREE: Multiple dietary modification options and treatment options were discussed and Avo agreed to follow the recommendations documented in the above note.  ARRANGE: Rhyann was educated  on the importance of frequent visits to treat obesity as outlined per CMS and USPSTF guidelines and agreed to schedule her next follow up appointment today.  Attestation Statements:   Reviewed by clinician on day of visit: allergies, medications, problem list, medical history, surgical history, family history, social history, and previous encounter notes.  I, Water quality scientist, CMA, am acting as Location manager for [employer], DO.  I have reviewed the above documentation for accuracy and completeness, and I agree with the above. Marjory Sneddon, D.O.  The [place] was signed into law in 2016 which includes the topic of electronic health records.  This provides immediate access to information in MyChart.  This includes consultation notes, operative notes, office notes, lab results and pathology reports.  If you have any questions about what you read please let us know at your next visit so we can discuss your concerns and take  corrective action if need be.  We are right here with you.

## 2020-01-18 ENCOUNTER — Encounter (HOSPITAL_COMMUNITY): Payer: Self-pay

## 2020-01-18 ENCOUNTER — Other Ambulatory Visit: Payer: Self-pay

## 2020-01-18 ENCOUNTER — Ambulatory Visit (HOSPITAL_COMMUNITY)
Admission: RE | Admit: 2020-01-18 | Discharge: 2020-01-18 | Disposition: A | Discharge status: Home / Self Care | Payer: Medicare Other | Source: Ambulatory Visit | Attending: Internal Medicine | Admitting: Internal Medicine

## 2020-01-18 DIAGNOSIS — C8591 Non-Hodgkin lymphoma, unspecified, lymph nodes of head, face, and neck: Secondary | ICD-10-CM

## 2020-01-18 DIAGNOSIS — R220 Localized swelling, mass and lump, head: Secondary | ICD-10-CM

## 2020-01-19 ENCOUNTER — Encounter (HOSPITAL_COMMUNITY): Payer: Self-pay

## 2020-01-19 ENCOUNTER — Ambulatory Visit (HOSPITAL_COMMUNITY)
Admission: RE | Admit: 2020-01-19 | Discharge: 2020-01-19 | Disposition: A | Discharge status: Home / Self Care | Payer: Medicare Other | Source: Ambulatory Visit | Attending: Internal Medicine | Admitting: Internal Medicine

## 2020-01-19 DIAGNOSIS — E1129 Type 2 diabetes mellitus with other diabetic kidney complication: Secondary | ICD-10-CM | POA: Diagnosis not present

## 2020-01-19 DIAGNOSIS — E039 Hypothyroidism, unspecified: Secondary | ICD-10-CM | POA: Diagnosis not present

## 2020-01-19 DIAGNOSIS — E1169 Type 2 diabetes mellitus with other specified complication: Secondary | ICD-10-CM | POA: Diagnosis not present

## 2020-01-19 DIAGNOSIS — I2510 Atherosclerotic heart disease of native coronary artery without angina pectoris: Secondary | ICD-10-CM | POA: Diagnosis not present

## 2020-01-19 DIAGNOSIS — I480 Paroxysmal atrial fibrillation: Secondary | ICD-10-CM | POA: Diagnosis not present

## 2020-01-19 DIAGNOSIS — C8591 Non-Hodgkin lymphoma, unspecified, lymph nodes of head, face, and neck: Secondary | ICD-10-CM | POA: Diagnosis not present

## 2020-01-19 DIAGNOSIS — R200 Anesthesia of skin: Secondary | ICD-10-CM | POA: Diagnosis not present

## 2020-01-19 DIAGNOSIS — E119 Type 2 diabetes mellitus without complications: Secondary | ICD-10-CM | POA: Diagnosis not present

## 2020-01-19 DIAGNOSIS — R220 Localized swelling, mass and lump, head: Secondary | ICD-10-CM | POA: Diagnosis not present

## 2020-01-19 DIAGNOSIS — J0100 Acute maxillary sinusitis, unspecified: Secondary | ICD-10-CM | POA: Diagnosis not present

## 2020-01-19 DIAGNOSIS — Z872 Personal history of diseases of the skin and subcutaneous tissue: Secondary | ICD-10-CM | POA: Diagnosis not present

## 2020-01-19 DIAGNOSIS — J3489 Other specified disorders of nose and nasal sinuses: Secondary | ICD-10-CM | POA: Diagnosis not present

## 2020-01-19 DIAGNOSIS — I639 Cerebral infarction, unspecified: Secondary | ICD-10-CM | POA: Diagnosis not present

## 2020-01-19 DIAGNOSIS — J320 Chronic maxillary sinusitis: Secondary | ICD-10-CM | POA: Diagnosis not present

## 2020-01-19 DIAGNOSIS — Q2547 Right aortic arch: Secondary | ICD-10-CM | POA: Diagnosis not present

## 2020-01-19 DIAGNOSIS — I10 Essential (primary) hypertension: Secondary | ICD-10-CM | POA: Diagnosis not present

## 2020-01-19 DIAGNOSIS — E7800 Pure hypercholesterolemia, unspecified: Secondary | ICD-10-CM | POA: Diagnosis not present

## 2020-01-19 MED ORDER — IOHEXOL 300 MG/ML  SOLN
100.0000 mL | Freq: Once | INTRAMUSCULAR | Status: AC | PRN
  Administered 2020-01-19: 100 mL via INTRAVENOUS

## 2020-01-23 ENCOUNTER — Inpatient Hospital Stay: Admission: RE | Admit: 2020-01-23 | Payer: Medicare Other | Source: Ambulatory Visit

## 2020-01-23 ENCOUNTER — Other Ambulatory Visit: Payer: Medicare Other

## 2020-01-25 ENCOUNTER — Inpatient Hospital Stay: Payer: Medicare Other | Attending: Hematology and Oncology | Admitting: Hematology and Oncology

## 2020-01-25 ENCOUNTER — Other Ambulatory Visit: Payer: Self-pay

## 2020-01-25 ENCOUNTER — Encounter: Payer: Self-pay | Admitting: Hematology and Oncology

## 2020-01-25 ENCOUNTER — Inpatient Hospital Stay: Payer: Medicare Other

## 2020-01-25 VITALS — BP 148/79 | HR 72 | Temp 97.0°F | Resp 18 | Ht 63.0 in | Wt 170.9 lb

## 2020-01-25 DIAGNOSIS — I4891 Unspecified atrial fibrillation: Secondary | ICD-10-CM | POA: Diagnosis not present

## 2020-01-25 DIAGNOSIS — Z7901 Long term (current) use of anticoagulants: Secondary | ICD-10-CM | POA: Diagnosis not present

## 2020-01-25 DIAGNOSIS — Z23 Encounter for immunization: Secondary | ICD-10-CM | POA: Insufficient documentation

## 2020-01-25 DIAGNOSIS — D479 Neoplasm of uncertain behavior of lymphoid, hematopoietic and related tissue, unspecified: Secondary | ICD-10-CM

## 2020-01-25 DIAGNOSIS — Z8673 Personal history of transient ischemic attack (TIA), and cerebral infarction without residual deficits: Secondary | ICD-10-CM | POA: Diagnosis not present

## 2020-01-25 DIAGNOSIS — Z7984 Long term (current) use of oral hypoglycemic drugs: Secondary | ICD-10-CM

## 2020-01-25 DIAGNOSIS — Z79899 Other long term (current) drug therapy: Secondary | ICD-10-CM

## 2020-01-25 DIAGNOSIS — I10 Essential (primary) hypertension: Secondary | ICD-10-CM

## 2020-01-25 DIAGNOSIS — E119 Type 2 diabetes mellitus without complications: Secondary | ICD-10-CM

## 2020-01-25 DIAGNOSIS — E039 Hypothyroidism, unspecified: Secondary | ICD-10-CM | POA: Diagnosis not present

## 2020-01-25 LAB — CBC WITH DIFFERENTIAL (CANCER CENTER ONLY)
Abs Immature Granulocytes: 0.03 10*3/uL (ref 0.00–0.07)
Basophils Absolute: 0.1 10*3/uL (ref 0.0–0.1)
Basophils Relative: 1 %
Eosinophils Absolute: 0.1 10*3/uL (ref 0.0–0.5)
Eosinophils Relative: 1 %
HCT: 37.4 % (ref 36.0–46.0)
Hemoglobin: 12 g/dL (ref 12.0–15.0)
Immature Granulocytes: 0 %
Lymphocytes Relative: 22 %
Lymphs Abs: 1.9 10*3/uL (ref 0.7–4.0)
MCH: 27.2 pg (ref 26.0–34.0)
MCHC: 32.1 g/dL (ref 30.0–36.0)
MCV: 84.8 fL (ref 80.0–100.0)
Monocytes Absolute: 0.7 10*3/uL (ref 0.1–1.0)
Monocytes Relative: 8 %
Neutro Abs: 5.8 10*3/uL (ref 1.7–7.7)
Neutrophils Relative %: 68 %
Platelet Count: 223 10*3/uL (ref 150–400)
RBC: 4.41 MIL/uL (ref 3.87–5.11)
RDW: 15.8 % — ABNORMAL HIGH (ref 11.5–15.5)
WBC Count: 8.6 10*3/uL (ref 4.0–10.5)
nRBC: 0 % (ref 0.0–0.2)

## 2020-01-25 LAB — CMP (CANCER CENTER ONLY)
ALT: 9 U/L (ref 0–44)
AST: 13 U/L — ABNORMAL LOW (ref 15–41)
Albumin: 3.8 g/dL (ref 3.5–5.0)
Alkaline Phosphatase: 62 U/L (ref 38–126)
Anion gap: 10 (ref 5–15)
BUN: 31 mg/dL — ABNORMAL HIGH (ref 8–23)
CO2: 26 mmol/L (ref 22–32)
Calcium: 10.4 mg/dL — ABNORMAL HIGH (ref 8.9–10.3)
Chloride: 104 mmol/L (ref 98–111)
Creatinine: 0.97 mg/dL (ref 0.44–1.00)
GFR, Estimated: 57 mL/min — ABNORMAL LOW
Glucose, Bld: 100 mg/dL — ABNORMAL HIGH (ref 70–99)
Potassium: 4.8 mmol/L (ref 3.5–5.1)
Sodium: 140 mmol/L (ref 135–145)
Total Bilirubin: 0.3 mg/dL (ref 0.3–1.2)
Total Protein: 7.3 g/dL (ref 6.5–8.1)

## 2020-01-25 LAB — SAVE SMEAR(SSMR), FOR PROVIDER SLIDE REVIEW

## 2020-01-25 LAB — LACTATE DEHYDROGENASE: LDH: 168 U/L (ref 98–192)

## 2020-01-25 MED ORDER — INFLUENZA VAC A&B SA ADJ QUAD 0.5 ML IM PRSY
PREFILLED_SYRINGE | INTRAMUSCULAR | Status: AC
  Filled 2020-01-25: qty 0.5

## 2020-01-25 MED ORDER — INFLUENZA VAC A&B SA ADJ QUAD 0.5 ML IM PRSY
0.5000 mL | PREFILLED_SYRINGE | Freq: Once | INTRAMUSCULAR | Status: AC
  Administered 2020-01-25: 0.5 mL via INTRAMUSCULAR

## 2020-01-25 NOTE — Progress Notes (Signed)
[place] [phone]   Fax:([phone] [place] NOTE  Patient Care Team: Seward Carol, MD as PCP - General (Internal Medicine)  Hematological/Oncological History #Atypical Lymphocyte Collection Anterior to the Left Maxillary Sinus 1) 12/20/2019: initial biopsy read as Atypical lymphoid infiltrate consistent with non-Hodgkin lymphoma by oral pathology 2) 01/01/2020: hematopathology review showed atypical B-cell rich lymphoid infiltrate, favor a reactive process. No evidence of a hematological malignancy noted.  3) 01/25/2020: establish care with Dr. Lorenso Courier   CHIEF COMPLAINTS/PURPOSE OF CONSULTATION:  "Atypical Lymphocyte Collection Anterior to the Left Maxillary Sinus"  HISTORY OF PRESENTING ILLNESS:  Haley Wilcox 86 y.o. female with medical history significant for gout, DM type II, HTN, HLD, and hypothyroidism who presents for evaluation of a concerning soft tissue mass anterior to the left maxillary sinus.   On review of the previous records Haley Wilcox initially had a biopsy performed of this atypical lymphocyte collection anterior to the left maxillary sinus performed on 12/10/2019. It was read by the dental pathology staff at [facility] who noted that it was consistent with a non-Hodgkin's lymphoma. Due to their lack of expertise in this area they referred her to hematopathology who reviewed it and they believed to be an atypical B-cell rich lymphoid infiltrate and favored a reactive process over a hematological malignancy. Due to concern for this initial reading of hematological malignancy the patient was referred to hematology for further evaluation and management.  On exam today Haley Wilcox notes that her left upper lip is still swollen following the biopsy performed by the dentistry staff. She notes that this was originally discovered after she went to dentistry for cleaning. She notes that she is having some soreness in her face at the location of  the lesion. She also reports that she was referred to ophthalmology due to some of the ocular symptoms she has been having but the ophthalmologist did not have any particular recommendations for her. She knows she is had no recent issues with fevers, chills, sweats, nausea, or diarrhea. She reports that her weight has been stable and she has not been having any issues with lymphadenopathy elsewhere in her body. A full 10 point ROS is listed below.  MEDICAL HISTORY:  Past Medical History:  Diagnosis Date  . A-fib ([place])   . Constipation   . Constipation   . Diabetes mellitus   . Gout   . Gout   . Heartburn   . Hyperlipidemia   . Hypertension   . Hypothyroidism   . Stroke [facility]) 06/07/2018    SURGICAL HISTORY: Past Surgical History:  Procedure Laterality Date  . ABDOMINAL HYSTERECTOMY    . d &c    . DILATION AND CURETTAGE OF UTERUS    . EYE SURGERY     bilateral cataract  . TUBAL LIGATION      SOCIAL HISTORY: Social History   Socioeconomic History  . Marital status: Widowed    Spouse name: Not on file  . Number of children: 2  . Years of education: Not on file  . Highest education level: Not on file  Occupational History  . Occupation: Retired Therapist, sports  Tobacco Use  . Smoking status: Never Smoker  . Smokeless tobacco: Never Used  Vaping Use  . Vaping Use: Never used  Substance and Sexual Activity  . Alcohol use: No    Alcohol/week: 0.0 standard drinks  . Drug use: No  . Sexual activity: Yes  Other Topics Concern  . Not on file  Social  History Narrative  . Not on file   Social Determinants of Health   Financial Resource Strain:   . Difficulty of Paying Living Expenses: Not on file  Food Insecurity:   . Worried About Charity fundraiser in the Last Year: Not on file  . Ran Out of Food in the Last Year: Not on file  Transportation Needs:   . Lack of Transportation (Medical): Not on file  . Lack of Transportation (Non-Medical): Not on file  Physical Activity:   .  Days of Exercise per Week: Not on file  . Minutes of Exercise per Session: Not on file  Stress:   . Feeling of Stress : Not on file  Social Connections:   . Frequency of Communication with Friends and Family: Not on file  . Frequency of Social Gatherings with Friends and Family: Not on file  . Attends Religious Services: Not on file  . Active Member of Clubs or Organizations: Not on file  . Attends Archivist Meetings: Not on file  . Marital Status: Not on file  Intimate Partner Violence:   . Fear of Current or Ex-Partner: Not on file  . Emotionally Abused: Not on file  . Physically Abused: Not on file  . Sexually Abused: Not on file    FAMILY HISTORY: Family History  Problem Relation Age of Onset  . Hypertension Mother   . Heart disease Mother   . Hypertension Sister   . Heart attack Sister   . Hypertension Brother   . Heart attack Brother     ALLERGIES:  is allergic to fluoride preparations and ivp dye [iodinated diagnostic agents].  MEDICATIONS:  Current Outpatient Medications  Medication Sig Dispense Refill  . amLODipine (NORVASC) 5 MG tablet Take 1 tablet (5 mg total) by mouth daily. 60 tablet 3  . apixaban (ELIQUIS) 5 MG TABS tablet Take 1 tablet (5 mg total) by mouth 2 (two) times daily. 60 tablet 2  . carvedilol (COREG) 12.5 MG tablet Take 12.5 mg by mouth 2 (two) times daily.    . colchicine 0.6 MG tablet Take 0.6 mg by mouth as needed.     . diphenhydramine-acetaminophen (TYLENOL PM EXTRA STRENGTH) 25-500 MG TABS tablet Take 1 tablet by mouth at bedtime as needed (sleep).     Marland Kitchen doxazosin (CARDURA) 2 MG tablet Take 2 mg by mouth every evening.     Marland Kitchen levothyroxine (SYNTHROID, LEVOTHROID) 25 MCG tablet Take 25 mcg by mouth daily.    Marland Kitchen losartan (COZAAR) 100 MG tablet Take 1 tablet (100 mg total) by mouth daily. 60 tablet 3  . metFORMIN (GLUCOPHAGE) 500 MG tablet Take by mouth 2 (two) times daily with a meal.    . Multiple Vitamins-Minerals (MULTIVITAMIN  GUMMIES ADULT) CHEW Chew 2 each by mouth daily.    Marland Kitchen omeprazole (PRILOSEC) 20 MG capsule TAKE 1 CAPSULE DAILY AS NEEDED (HEART BURN) (Patient taking differently: Take 20 mg by mouth as needed (heart burn). ) 30 capsule 0  . polyethylene glycol powder (GLYCOLAX/MIRALAX) powder Mix 17gm in 4oz of water.  Start with 2 doses per day, but may take up to 6 doses per day.  Titrate number of doses to allow 2-3 soft bowel movements daily. (Patient taking differently: Take 17 g by mouth daily. Mix 17gm in 4oz of water) 850 g 1  . rosuvastatin (CRESTOR) 10 MG tablet Take 1 tablet (10 mg total) by mouth daily. 90 tablet 2  . spironolactone (ALDACTONE) 25 MG tablet TAKE 1  TABLET DAILY 90 tablet 3  . Vitamin D, Ergocalciferol, (DRISDOL) 1.25 MG (50000 UNIT) CAPS capsule Take 1 capsule (50,000 Units total) by mouth every 7 (seven) days. 4 capsule 0   No current facility-administered medications for this visit.    REVIEW OF SYSTEMS:   Constitutional: ( - ) fevers, ( - )  chills , ( - ) night sweats Eyes: ( - ) blurriness of vision, ( - ) double vision, ( - ) watery eyes Ears, nose, mouth, throat, and face: ( - ) mucositis, ( - ) sore throat Respiratory: ( - ) cough, ( - ) dyspnea, ( - ) wheezes Cardiovascular: ( - ) palpitation, ( - ) chest discomfort, ( - ) lower extremity swelling Gastrointestinal:  ( - ) nausea, ( - ) heartburn, ( - ) change in bowel habits Skin: ( - ) abnormal skin rashes Lymphatics: ( - ) new lymphadenopathy, ( - ) easy bruising Neurological: ( - ) numbness, ( - ) tingling, ( - ) new weaknesses Behavioral/Psych: ( - ) mood change, ( - ) new changes  All other systems were reviewed with the patient and are negative.  PHYSICAL EXAMINATION: ECOG PERFORMANCE STATUS: 1 - Symptomatic but completely ambulatory  Vitals:   01/25/20 1406  BP: (!) 148/79  Pulse: 72  Resp: 18  Temp: (!) 97 F (36.1 C)  SpO2: 99%   Filed Weights   01/25/20 1406  Weight: 170 lb 14.4 oz (77.5 kg)     GENERAL: well appearing elderly African American female in NAD  SKIN: skin color, texture, turgor are normal, no rashes or significant lesions EYES: conjunctiva are pink and non-injected, sclera clear LUNGS: clear to auscultation and percussion with normal breathing effort HEART: regular rate & rhythm and no murmurs and no lower extremity edema Musculoskeletal: no cyanosis of digits and no clubbing  PSYCH: alert & oriented x 3, fluent speech NEURO: no focal motor/sensory deficits  LABORATORY DATA:  I have reviewed the data as listed CBC Latest Ref Rng & Units 01/25/2020 11/14/2019 06/09/2018  WBC 4.0 - 10.5 K/uL 8.6 9.0 7.1  Hemoglobin 12.0 - 15.0 g/dL 12.0 12.8 12.8  Hematocrit 36 - 46 % 37.4 40.7 39.6  Platelets 150 - 400 K/uL 223 213 227    CMP Latest Ref Rng & Units 01/25/2020 11/14/2019 09/21/2018  Glucose 70 - 99 mg/dL 100(H) 136(H) 115(H)  BUN 8 - 23 mg/dL 31(H) 20 23  Creatinine 0.44 - 1.00 mg/dL 0.97 0.93 0.97  Sodium 135 - 145 mmol/L 140 142 134  Potassium 3.5 - 5.1 mmol/L 4.8 4.7 4.8  Chloride 98 - 111 mmol/L 104 103 96  CO2 22 - 32 mmol/L 26 26 23   Calcium 8.9 - 10.3 mg/dL 10.4(H) 10.5(H) 9.8  Total Protein 6.5 - 8.1 g/dL 7.3 7.6 -  Total Bilirubin 0.3 - 1.2 mg/dL 0.3 0.3 -  Alkaline Phos 38 - 126 U/L 62 72 -  AST 15 - 41 U/L 13(L) 15 -  ALT 0 - 44 U/L 9 12 -     PATHOLOGY:     RADIOGRAPHIC STUDIES: I have personally reviewed the radiological images as listed and agreed with the findings in the report: soft tissue mass anterior to the left maxillary sinus.   CT SOFT TISSUE NECK W CONTRAST  Result Date: 01/19/2020 CLINICAL DATA:  Left maxillary mass found on dental exam. 13 hour premedication prep for contrast. No contrast allergic reaction documented. EXAM: CT NECK WITH CONTRAST TECHNIQUE: Multidetector CT imaging of the  neck was performed using the standard protocol following the bolus administration of intravenous contrast. CONTRAST:  115mL OMNIPAQUE IOHEXOL  300 MG/ML  SOLN COMPARISON:  None. FINDINGS: Pharynx and larynx: Normal. No mass or swelling. Salivary glands: No inflammation, mass, or stone. Thyroid: Negative Lymph nodes: No enlarged lymph nodes in the neck. Vascular: Normal vascular enhancement. Atherosclerotic calcification carotid bifurcation bilaterally. Limited intracranial: Negative Visualized orbits: No orbital mass.  Bilateral cataract extraction. Mastoids and visualized paranasal sinuses: Moderate mucosal edema left maxillary sinus. No air-fluid level. No bony destruction. Soft tissue mass in the subcutaneous tissues anterior to the left maxillary sinus measuring approximately 17 x 30 mm. This is touching the bone of the maxillary sinus but there is no bony destruction or extension into the maxillary sinus. Skeleton: Cervical disc and facet degeneration diffusely. Small calcification below the anterior arch of C1 may represent mild calcific tendinitis of the longus coli muscle. Upper chest: Lung apices clear bilaterally. Other: None IMPRESSION: 17 x 30 mm soft tissue mass anterior to the left maxillary sinus. No bone destruction or extension into the sinus. This has the appearance of neoplasm and biopsy recommended. There is mucosal edema in the left maxillary sinus which is lower density than the mass. Negative for adenopathy in the neck. Atherosclerotic calcification in the carotid bifurcation bilaterally Small calcification inferior to the anterior arch of C1 may represent calcific tendinitis. Electronically Signed   By: Franchot Gallo M.D.   On: 01/19/2020 16:35   CT CHEST W CONTRAST  Result Date: 01/21/2020 CLINICAL DATA:  Left maxillary mass found during recent dental examination. Left lip numbness. Left orbital erythema. EXAM: CT CHEST WITH CONTRAST TECHNIQUE: Multidetector CT imaging of the chest was performed during intravenous contrast administration. CONTRAST:  125mL OMNIPAQUE IOHEXOL 300 MG/ML  SOLN COMPARISON:  None. FINDINGS:  Cardiovascular: Mild cardiomegaly. No significant pericardial effusion/thickening. Three-vessel coronary atherosclerosis. Atherosclerotic nonaneurysmal thoracic aorta. Aberrant nonaneurysmal right subclavian artery arising from the distal aortic arch with retroesophageal course. Normal caliber pulmonary arteries. No central pulmonary emboli. Mediastinum/Nodes: No discrete thyroid nodules. Unremarkable esophagus. No pathologically enlarged axillary, mediastinal or hilar lymph nodes. Lungs/Pleura: No pneumothorax. No pleural effusion. Solid 4 mm posterior right lower lobe pulmonary nodule (series 7/image 84). No acute consolidative airspace disease, lung masses or additional significant pulmonary nodules. Upper abdomen: Partially visualized cholelithiasis. Simple 1.3 cm anterior liver cyst. Hypodense 1.4 cm faintly peripherally calcified peripheral right liver lesion (series 4/image 53), stable since 05/30/2019 CT abdomen study and decreased from 3.9 cm on 06/01/2012 CT abdomen study, considered benign. Subcentimeter hypodense left liver dome lesion is too small to characterize and is unchanged, presumably benign. Scattered granulomatous calcifications in the liver stable. Musculoskeletal: No aggressive appearing focal osseous lesions. Moderate thoracic spondylosis. IMPRESSION: 1. Solitary 4 mm solid right lower lobe pulmonary nodule. No follow-up needed if patient is low-risk. Non-contrast chest CT can be considered in 12 months if patient is high-risk. This recommendation follows the consensus statement: Guidelines for Management of Incidental Pulmonary Nodules Detected on CT Images: From the Fleischner Society 2017; Radiology 2017; 284:228-243. 2. Otherwise no active pulmonary disease. 3. Aberrant right subclavian artery. 4. Cholelithiasis. 5. Mild cardiomegaly.  Three-vessel coronary atherosclerosis. 6. Aortic Atherosclerosis (ICD10-I70.0). Electronically Signed   By: Ilona Sorrel M.D.   On: 01/21/2020 13:07    CT MAXILLOFACIAL W CONTRAST  Result Date: 01/19/2020 CLINICAL DATA:  Left maxillary mass found on dental exam. 13 hour prep for contrast allergy. EXAM: CT MAXILLOFACIAL WITH CONTRAST TECHNIQUE: Multidetector CT imaging of the  maxillofacial structures was performed with intravenous contrast. Multiplanar CT image reconstructions were also generated. CONTRAST:  128mL OMNIPAQUE IOHEXOL 300 MG/ML  SOLN COMPARISON:  CT neck 01/19/2020 FINDINGS: Osseous: No acute skeletal abnormality. Orbits: Bilateral cataract extraction.  No orbital mass or edema. Sinuses: Moderate mucosal edema left maxillary sinus. Remaining sinuses clear. Soft tissues: Soft tissue mass anterior to the left maxillary sinus appears to be enhancing and measures approximately 17 x 31 mm. This has ill-defined margins. Probable neoplasm. No bony destruction. The mass extends to the bone. Limited intracranial: Negative IMPRESSION: Enhancing soft tissue mass anterior to the left maxillary sinus. No bone destruction. Probable neoplasm recommend biopsy. There is moderate mucosal edema left maxillary sinus of lower density than the mass. No adenopathy in the upper neck. Electronically Signed   By: Franchot Gallo M.D.   On: 01/19/2020 16:38    ASSESSMENT & PLAN Haley Wilcox 86 y.o. female with medical history significant for gout, DM type II, HTN, HLD, and hypothyroidism who presents for evaluation of a concerning soft tissue mass anterior to the left maxillary sinus. After review the labs, the records, review the pathology the findings most consistent with an atypical lymphocyte collection anterior to the left maxillary sinus. Based off the hematopathology review at [facility] this was not deemed to be a hematological malignancy. Therefore I do not think there is anything that our clinic had to add at this time. Given the location of this lesion I would recommend evaluation by ENT for consideration of further management. In the event this is found to be  a hematological malignancy or disorder the falls within the realm of our expertise we be happy to see the patient back for consideration of treatment. In the interim there is no need for routine follow-up in our clinic. We will make a referral to ENT today and have requested that the patient reach out to [place] if there has any difficulty making that connection.  #Atypical Lymphocyte Collection Anterior to the Left Maxillary Sinus --review of final pathology results from [place] shows no evidence of a hematological malignancy --unclear the etiology of this soft tissue mass (17 x 30 mm). Recommend referral to ENT for evaluation  --no need for routine f/u in our clinic unless the tissue diagnosis was to change or if this was thought to be a primarily hematological disorder.   --RTC PRN   Orders Placed This Encounter  Procedures  . CBC with Differential (Cancer Center Only)    Standing Status:   Future    Number of Occurrences:   1    Standing Expiration Date:   01/24/2021  . CMP ([place] only)    Standing Status:   Future    Number of Occurrences:   1    Standing Expiration Date:   01/24/2021  . Lactate dehydrogenase (LDH)    Standing Status:   Future    Number of Occurrences:   1    Standing Expiration Date:   01/24/2021  . Save Smear ([facility])    Standing Status:   Future    Number of Occurrences:   1    Standing Expiration Date:   01/24/2021    All questions were answered. The patient knows to call the clinic with any problems, questions or concerns.  A total of more than 45 minutes were spent on this encounter and over half of that time was spent on counseling and coordination of care as outlined above.   Ledell Peoples, MD  Department of Hematology/Oncology [place] [facility] Phone: [phone] Pager: [phone] Email: [email or web address]  01/25/2020 7:00 PM

## 2020-01-29 ENCOUNTER — Telehealth: Payer: Self-pay | Admitting: *Deleted

## 2020-01-29 NOTE — Telephone Encounter (Signed)
-----   Message from Orson Slick, MD sent at 01/29/2020  8:15 AM EST ----- Please let Mrs. Haley Wilcox know that her bloodwork showed no concerning abnormalities. Also please assure that she has been contacted by ENT for an appointment.  ----- Message ----- From: Buel Ream, Lab In [place] Sent: 01/25/2020   4:04 PM EST To: Orson Slick, MD

## 2020-01-29 NOTE — Telephone Encounter (Signed)
[name]. No answer. [name], [name]. Spoke with her and advised that her mother's lab results did not show any abnormalities. Asked if they had heard from ENT office yet and they have not. Referral was placed on 01/25/20. Advised to give it a little longer . If they have not heard from them by the end of the week, advised to call back [phone].

## 2020-01-31 ENCOUNTER — Ambulatory Visit (INDEPENDENT_AMBULATORY_CARE_PROVIDER_SITE_OTHER): Payer: Medicare Other | Admitting: Family Medicine

## 2020-01-31 ENCOUNTER — Ambulatory Visit: Payer: Medicare Other

## 2020-01-31 ENCOUNTER — Other Ambulatory Visit: Payer: Self-pay

## 2020-01-31 VITALS — BP 133/61 | HR 64 | Temp 97.6°F | Ht 63.0 in | Wt 166.0 lb

## 2020-01-31 DIAGNOSIS — Z6830 Body mass index (BMI) 30.0-30.9, adult: Secondary | ICD-10-CM

## 2020-01-31 DIAGNOSIS — E669 Obesity, unspecified: Secondary | ICD-10-CM

## 2020-01-31 DIAGNOSIS — I10 Essential (primary) hypertension: Secondary | ICD-10-CM

## 2020-01-31 DIAGNOSIS — I6523 Occlusion and stenosis of bilateral carotid arteries: Secondary | ICD-10-CM | POA: Diagnosis not present

## 2020-01-31 DIAGNOSIS — E559 Vitamin D deficiency, unspecified: Secondary | ICD-10-CM

## 2020-01-31 MED ORDER — VITAMIN D (ERGOCALCIFEROL) 1.25 MG (50000 UNIT) PO CAPS: 50000.0000 [IU] | ORAL_CAPSULE | ORAL | 0 refills | Status: DC

## 2020-02-01 ENCOUNTER — Ambulatory Visit: Payer: Medicare Other | Admitting: Cardiology

## 2020-02-01 ENCOUNTER — Encounter: Payer: Self-pay | Admitting: Cardiology

## 2020-02-01 VITALS — BP 144/77 | HR 68 | Ht 63.0 in | Wt 158.0 lb

## 2020-02-01 DIAGNOSIS — E782 Mixed hyperlipidemia: Secondary | ICD-10-CM | POA: Diagnosis not present

## 2020-02-01 DIAGNOSIS — I4819 Other persistent atrial fibrillation: Secondary | ICD-10-CM | POA: Diagnosis not present

## 2020-02-01 DIAGNOSIS — I10 Essential (primary) hypertension: Secondary | ICD-10-CM

## 2020-02-01 DIAGNOSIS — E119 Type 2 diabetes mellitus without complications: Secondary | ICD-10-CM

## 2020-02-01 DIAGNOSIS — I6523 Occlusion and stenosis of bilateral carotid arteries: Secondary | ICD-10-CM

## 2020-02-01 MED ORDER — AMLODIPINE BESYLATE 10 MG PO TABS: 5.0000 mg | ORAL_TABLET | Freq: Every day | ORAL | 3 refills | Status: DC

## 2020-02-01 NOTE — Progress Notes (Signed)
Subjective:   Haley Wilcox, female    DOB: 1931/06/07, 86 y.o.   MRN: [id number]   Chief complaint:  Atrial fibrillation   HPI   86 y.o. 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  Blood pressure is much better controlled after increasing amlodipine dose to 10 mg.  Her generalized fatigue symptoms have also completely resolved.  She is now back to walking regularly without any difficulty.  She has an left upper maxillary mass, for which she is going to see ENT.  Earlier, diagnosis of non-Hodgkin's lymphoma was contemplated.  However, she tells me that she was told by oncologist that NHL diagnosis was excluded.    Current Outpatient Medications on File Prior to Visit  Medication Sig Dispense Refill  . amLODipine (NORVASC) 5 MG tablet Take 1 tablet (5 mg total) by mouth daily. 60 tablet 3  . apixaban (ELIQUIS) 5 MG TABS tablet Take 1 tablet (5 mg total) by mouth 2 (two) times daily. 60 tablet 2  . carvedilol (COREG) 12.5 MG tablet Take 12.5 mg by mouth 2 (two) times daily.    . colchicine 0.6 MG tablet Take 0.6 mg by mouth as needed.     . diphenhydramine-acetaminophen (TYLENOL PM EXTRA STRENGTH) 25-500 MG TABS tablet Take 1 tablet by mouth at bedtime as needed (sleep).     Marland Kitchen doxazosin (CARDURA) 2 MG tablet Take 2 mg by mouth every evening.     Marland Kitchen levothyroxine (SYNTHROID, LEVOTHROID) 25 MCG tablet Take 25 mcg by mouth daily.    Marland Kitchen losartan (COZAAR) 100 MG tablet Take 1 tablet (100 mg total) by mouth daily. 60 tablet 3  . metFORMIN (GLUCOPHAGE) 500 MG tablet Take by mouth 2 (two) times daily with a meal.    . Multiple Vitamins-Minerals (MULTIVITAMIN GUMMIES ADULT) CHEW Chew 2 each by mouth daily.    Marland Kitchen omeprazole (PRILOSEC) 20 MG capsule TAKE 1 CAPSULE DAILY AS NEEDED (HEART BURN) (Patient taking differently: Take 20 mg by mouth as needed  (heart burn). ) 30 capsule 0  . polyethylene glycol powder (GLYCOLAX/MIRALAX) powder Mix 17gm in 4oz of water.  Start with 2 doses per day, but may take up to 6 doses per day.  Titrate number of doses to allow 2-3 soft bowel movements daily. (Patient taking differently: Take 17 g by mouth daily. Mix 17gm in 4oz of water) 850 g 1  . rosuvastatin (CRESTOR) 10 MG tablet Take 1 tablet (10 mg total) by mouth daily. 90 tablet 2  . spironolactone (ALDACTONE) 25 MG tablet TAKE 1 TABLET DAILY 90 tablet 3  . Vitamin D, Ergocalciferol, (DRISDOL) 1.25 MG (50000 UNIT) CAPS capsule Take 1 capsule (50,000 Units total) by mouth every 7 (seven) days. 4 capsule 0   No current facility-administered medications on file prior to visit.    Cardiovascular studies:  EKG 08/24/3019: Atrial fibrillation with slow ventricular response 54 bpm Low voltage in precordial leads.  Nonspecific T-abnormality.   Carotid artery duplex 08/14/2019:  Stenosis in the right internal carotid artery (>=70%). The right PSV internal/common carotid artery ratio of 5.08 is consistent with a stenosis of >70%.  Stenosis in the left internal carotid artery (50-69%).  Antegrade right vertebral artery flow. Antegrade left vertebral artery flow.  Follow up in six months is appropriate if clinically indicated.  Compared to the study done on 06/08/2018, there is progression of disease on right, this  correlated with CTA findings of 70% stenosis at the same time. There is progression of disease in the left ICA from < 30% stenosis.    EKG 02/23/2019: Atrial fibrillation with slow ventricular rate 56 bpm. Low voltage in precordial leads.  Nonspecific T-abnormality.    Echocardiogram 06/08/2018:  1. The left ventricle has normal systolic function with an ejection fraction of 60-65%. The cavity size was normal. There is mildly increased left ventricular wall thickness. Left ventricular diastolic function could not be evaluated secondary to atrial   fibrillation.  2. The right ventricle has normal systolic function. The cavity was normal. There is no increase in right ventricular wall thickness.  3. Left atrial size was severely dilated.  4. Right atrial size was mildly dilated.  5. The aortic root and ascending aorta are normal in size and structure.  6. The interatrial septum was not assessed.   Recent labs: 11/14/2019: Glucose 136, BUN/Cr 20/0.93. EGFR 56. Na/K 142/4.7. Calcium 10.5. Rest of the CMP normal H/H 12.8/40.7. MCV 86. Platelets 213 HbA1C 7.3% Chol 129, TG 100, HDL 51, LDL 59 TSH 3.0 normal  08/07/2019: Glucose 169, BUN/Cr 20/0.9. eGFR 54. HbA1C 7.5% Chol 123, TG 84, HDL 49, LDL 155 TSH 3.3 normal   Review of Systems  Cardiovascular: Negative for chest pain, dyspnea on exertion, leg swelling, palpitations and syncope.        Body mass index is 27.99 kg/m.   Vitals:   02/01/20 1454  BP: (!) 144/77  Pulse: 68  SpO2: 97%    Objective:    Physical Exam Vitals and nursing note reviewed.  Constitutional:      General: She is not in acute distress. Neck:     Vascular: No JVD.  Cardiovascular:     Rate and Rhythm: Normal rate. Rhythm irregular.     Pulses: Intact distal pulses.     Heart sounds: No murmur heard.   Pulmonary:     Effort: Pulmonary effort is normal.     Breath sounds: Normal breath sounds. No wheezing or rales.         Assessment & Recommendations:   85 y.o. 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  Fatigue: Multifactorial, stable. I do not think she warrants cardioversion in absence of any other symptoms.   Persistent atrial fibrillation Reasonable to continue rate control strategy. Continue coreg 12.5 mg bid.  CHA2DS2VASc score 7, annual stroke risk 11% Continue eliquis 5 mg bid Given her high stroke risk, I recommend avoiding  interruption of anticoagulation. Should eliquis need to be stopped for dental procedure, I recommend bridging with lovenox.   Hyperlipidemia: LDL down to 59 on rosuvastatin 10 mg  Hypertension Better controlled.   H/o stroke Stroke in [place] ACA. Minimal neurodeficit.  Progressive >70% stenosis in Rt ICA, without symptoms. Continue Crestor to 10 mg. LDL 59. Not on Aspirin due to ongoing use of eliquis given Afib. Continue f/u w/Neurology.  Carotid stenosis, asymptomatic, bilateral As above.  Type 2 DM: Follow up with PCP.  F/u in 05/2020  Nigel Mormon, MD [facility] Pager: [phone] Office: [phone] If no answer Cell [phone]

## 2020-02-01 NOTE — Progress Notes (Signed)
Chief Complaint:   OBESITY Haley Wilcox is here to discuss her progress with her obesity treatment plan along with follow-up of her obesity related diagnoses. Haley Wilcox is on the [employer] and states she is following her eating plan approximately 50% of the time. Haley Wilcox states she is walking the dog for 20 minutes 3 times per week.  Today's visit was #: 6 Starting weight: 171 lbs Starting date: 11/14/2019 Today's weight: 166 lbs Today's date: 01/30/2020 Total lbs lost to date: 5 lbs Total lbs lost since last in-office visit: 1 lb Total weight loss percentage to date: -2.92%  Interim History: Haley Wilcox had a wonderful birthday.  She had had no problems with the meal plan.  No hunger or cravings.  Recently told there was no cancer found on her oral mass and was sent to the ENT.  She has been exercising more than usual.  She is less tired/less fatigues.    Plan:  She is to maintain her weight at current state or lose 1 pound or so.     Assessment/Plan:     Meds ordered this encounter  Medications  . Vitamin D, Ergocalciferol, (DRISDOL) 1.25 MG (50000 UNIT) CAPS capsule    Sig: Take 1 capsule (50,000 Units total) by mouth every 7 (seven) days.    Dispense:  4 capsule    Refill:  0    1. Essential hypertension [facility] restarted her on Norvasc, and she is now on 10 mg instead of 5 mg.  At home, blood pressure is 120s/60s.  No low blood pressures or symptoms or concerns.  Plan:  Blood pressure at goal.  Continue home blood pressure monitoring and medications per Cardiology.   BP Readings from Last 3 Encounters:  02/01/20 (!) 144/77  01/31/20 133/61  01/25/20 (!) 148/79     2. Vitamin D deficiency Haley Wilcox's Vitamin D level was 31.8 on 11/14/2019. She is currently taking prescription vitamin D 50,000 IU each week. She denies nausea, vomiting or muscle weakness.  Plan: - Reiterated importance of vitamin D (as well as calcium) to their health and wellbeing.  -  Reminded pt that weight loss will likely improve availability of vitamin D, thus encouraged Haley Wilcox to continue with meal plan and their weight loss efforts to further improve this condition. - I recommend pt continue to take weekly prescription vit D 50,000 IU - Informed patient this may be a lifelong thing, and she was encouraged to continue to take the medicine until told otherwise.   - We will need to monitor levels regularly (every 3-4 mo on average) to keep levels within normal limits.  - pt's questions and concerns regarding this condition addressed.  - RF Vitamin D, Ergocalciferol, (DRISDOL) 1.25 MG (50000 UNIT) CAPS capsule; Take 1 capsule (50,000 Units total) by mouth every 7 (seven) days.  Dispense: 4 capsule; Refill: 0    3. Class 1 obesity with serious comorbidity and body mass index (BMI) of 30.0 to 30.9 in adult, unspecified obesity type  Haley Wilcox is currently in the action stage of change. As such, her goal is to maintain weight for now. She has agreed to the [employer].   Exercise goals: As is.  Behavioral modification strategies: keeping healthy foods in the home, holiday eating strategies  and avoiding temptations.  Haley Wilcox has agreed to follow-up with our clinic in 3 weeks. She was informed of the importance of frequent follow-up visits to maximize her success with intensive lifestyle modifications for her multiple health conditions.  Objective:   Blood pressure 133/61, pulse 64, temperature 97.6 F (36.4 C), height 5\' 3"  (1.6 m), weight 166 lb (75.3 kg), SpO2 97 %. Body mass index is 29.41 kg/m.  General: Cooperative, alert, well developed, in no acute distress. HEENT: Conjunctivae and lids unremarkable. Cardiovascular: Regular rhythm.  Lungs: Normal work of breathing. Neurologic: No focal deficits.   Lab Results  Component Value Date   CREATININE 0.97 01/25/2020   BUN 31 (H) 01/25/2020   NA 140 01/25/2020   K 4.8 01/25/2020   CL 104 01/25/2020    CO2 26 01/25/2020   Lab Results  Component Value Date   ALT 9 01/25/2020   AST 13 (L) 01/25/2020   ALKPHOS 62 01/25/2020   BILITOT 0.3 01/25/2020   Lab Results  Component Value Date   HGBA1C 7.3 (H) 11/14/2019   HGBA1C 6.7 (H) 06/08/2018   HGBA1C 6.9 (H) 03/30/2017   HGBA1C 6.5 04/16/2014   HGBA1C 6.2 10/17/2013   Lab Results  Component Value Date   INSULIN 20.1 11/14/2019   INSULIN 15.4 03/30/2017   Lab Results  Component Value Date   TSH 3.030 11/14/2019   Lab Results  Component Value Date   CHOL 129 11/10/2019   HDL 51 11/10/2019   LDLCALC 59 11/10/2019   LDLDIRECT 140.8 10/07/2012   TRIG 100 11/10/2019   CHOLHDL 2.5 11/10/2019   Lab Results  Component Value Date   WBC 8.6 01/25/2020   HGB 12.0 01/25/2020   HCT 37.4 01/25/2020   MCV 84.8 01/25/2020   PLT 223 01/25/2020   Lab Results  Component Value Date   IRON 51 10/07/2012   TIBC 192 (L) 06/03/2012   FERRITIN 114 06/03/2012     Obesity Behavioral Intervention:   Approximately 15 minutes were spent on the discussion below.  ASK: We discussed the diagnosis of obesity with Haley Wilcox today and Haley Wilcox agreed to give [place] permission to discuss obesity behavioral modification therapy today.  ASSESS: Haley Wilcox has the diagnosis of obesity and her BMI today is 29.4. Haley Wilcox is in the action stage of change.   ADVISE: Haley Wilcox was educated on the multiple health risks of obesity as well as the benefit of weight loss to improve her health. She was advised of the need for long term treatment and the importance of lifestyle modifications to improve her current health and to decrease her risk of future health problems.  AGREE: Multiple dietary modification options and treatment options were discussed and Haley Wilcox agreed to follow the recommendations documented in the above note.  ARRANGE: Haley Wilcox was educated on the importance of frequent visits to treat obesity as outlined per CMS and USPSTF guidelines and agreed  to schedule her next follow up appointment today.    Attestation Statements:   Reviewed by clinician on day of visit: allergies, medications, problem list, medical history, surgical history, family history, social history, and previous encounter notes.  I, Water quality scientist, CMA, am acting as Location manager for [employer], DO.  I have reviewed the above documentation for accuracy and completeness, and I agree with the above. Haley Wilcox, D.O.  The [place] was signed into law in 2016 which includes the topic of electronic health records.  This provides immediate access to information in MyChart.  This includes consultation notes, operative notes, office notes, lab results and pathology reports.  If you have any questions about what you read please let us know at your next visit so we can discuss your concerns and  take corrective action if need be.  We are right here with you.

## 2020-02-02 ENCOUNTER — Telehealth: Payer: Self-pay

## 2020-02-02 NOTE — Telephone Encounter (Signed)
Called Dr. Janeice Robinson office to check on patient's ENT referral. Receptionist stated referral has not been received. ENT referral and patient information faxed to Dr. Janeice Robinson office at [phone]. Confirmation of fax received. Patient made aware.

## 2020-02-04 ENCOUNTER — Other Ambulatory Visit: Payer: Self-pay | Admitting: Cardiology

## 2020-02-04 DIAGNOSIS — I6523 Occlusion and stenosis of bilateral carotid arteries: Secondary | ICD-10-CM

## 2020-02-08 ENCOUNTER — Other Ambulatory Visit: Payer: Self-pay

## 2020-02-16 ENCOUNTER — Encounter (INDEPENDENT_AMBULATORY_CARE_PROVIDER_SITE_OTHER): Payer: Self-pay | Admitting: Family Medicine

## 2020-02-21 ENCOUNTER — Ambulatory Visit (INDEPENDENT_AMBULATORY_CARE_PROVIDER_SITE_OTHER): Payer: Medicare Other | Admitting: Family Medicine

## 2020-02-27 ENCOUNTER — Encounter (HOSPITAL_BASED_OUTPATIENT_CLINIC_OR_DEPARTMENT_OTHER): Payer: Self-pay | Admitting: Anesthesiology

## 2020-02-27 ENCOUNTER — Other Ambulatory Visit: Payer: Self-pay

## 2020-02-27 ENCOUNTER — Other Ambulatory Visit (HOSPITAL_COMMUNITY)
Admission: RE | Admit: 2020-02-27 | Discharge: 2020-02-27 | Disposition: A | Discharge status: Home / Self Care | Payer: Medicare Other | Source: Ambulatory Visit | Attending: Otolaryngology | Admitting: Otolaryngology

## 2020-02-27 ENCOUNTER — Encounter (HOSPITAL_BASED_OUTPATIENT_CLINIC_OR_DEPARTMENT_OTHER): Payer: Self-pay | Admitting: Otolaryngology

## 2020-02-27 DIAGNOSIS — Z01812 Encounter for preprocedural laboratory examination: Secondary | ICD-10-CM | POA: Diagnosis not present

## 2020-02-27 DIAGNOSIS — Z20822 Contact with and (suspected) exposure to covid-19: Secondary | ICD-10-CM | POA: Diagnosis not present

## 2020-02-27 DIAGNOSIS — R220 Localized swelling, mass and lump, head: Secondary | ICD-10-CM | POA: Diagnosis not present

## 2020-02-27 LAB — SARS CORONAVIRUS 2 (TAT 6-24 HRS): SARS Coronavirus 2: NEGATIVE

## 2020-02-27 NOTE — H&P (Signed)
HPI:   Haley Wilcox is a 86 y.o. female who presents as a consult Patient.   Referring Provider: Polite, Ronald Dewitt, *  Chief complaint: Facial mass.  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 Biopsy was done and sent to [place]. Initially was read as non-Hodgkin's lymphoma but then with further testing revealed that it was polyclonal. The final diagnosis was lymphoproliferative process, Not consistent with lymphoma. She has some residual numbness around the upper lip from the biopsy but otherwise no symptoms. CT scan of the neck and chest was all negative.  PMH/Meds/All/SocHx/FamHx/ROS:   Past Medical History:  Diagnosis Date  . Gout  . Hypertension   Past Surgical History:  Procedure Laterality Date  . CATARACT EXTRACTION, BILATERAL  . DILATION AND CURETTAGE OF UTERUS  . HYSTERECTOMY   No family history of bleeding disorders, wound healing problems or difficulty with anesthesia.   Social History   Socioeconomic History  . Marital status: Married  Spouse name: Not on file  . Number of children: Not on file  . Years of education: Not on file  . Highest education level: Not on file  Occupational History  . Not on file  Tobacco Use  . Smoking status: Never Smoker  . Smokeless tobacco: Never Used  Vaping Use  . Vaping Use: Never used  Substance and Sexual Activity  . Alcohol use: Not on file  . Drug use: Not on file  . Sexual activity: Not on file  Other Topics Concern  . Not on file  Social History Narrative  . Not on file   Social Determinants of Health   Financial Resource Strain:  . Difficulty of Paying Living Expenses: Not on file  Food Insecurity:  . Worried About Running Out of Food in the Last Year: Not on file  . Ran Out of Food in the Last Year: Not on file  Transportation Needs:  . Lack of Transportation (Medical): Not on file  . Lack of Transportation (Non-Medical): Not on file   Physical Activity:  . Days of Exercise per Week: Not on file  . Minutes of Exercise per Session: Not on file  Stress:  . Feeling of Stress : Not on file  Social Connections:  . Frequency of Communication with Friends and Family: Not on file  . Frequency of Social Gatherings with Friends and Family: Not on file  . Attends Religious Services: Not on file  . Active Member of Clubs or Organizations: Not on file  . Attends Club or Organization Meetings: Not on file  . Marital Status: Not on file   Current Outpatient Medications:  . amLODIPine (NORVASC) 10 MG tablet, , Disp: , Rfl:  . carvediloL (COREG) 12.5 MG tablet, , Disp: , Rfl:  . colchicine 0.6 mg tablet, , Disp: , Rfl:  . doxazosin (CARDURA) 2 MG tablet, , Disp: , Rfl:  . ELIQUIS 5 mg tablet *ANTICOAGULANT*, , Disp: , Rfl:  . ergocalciferol (VITAMIN D2) 1,250 mcg (50,000 unit) capsule, , Disp: , Rfl:  . levothyroxine (SYNTHROID) 25 MCG tablet, , Disp: , Rfl:  . losartan (COZAAR) 100 MG tablet, , Disp: , Rfl:  . metFORMIN (GLUCOPHAGE) 500 MG tablet, , Disp: , Rfl:  . predniSONE (DELTASONE) 10 MG tablet, , Disp: , Rfl:  . rosuvastatin (CRESTOR) 10 MG tablet, , Disp: , Rfl:  . spironolactone (ALDACTONE) 25 MG tablet, , Disp: , Rfl:   A complete ROS was performed with   pertinent positives/negatives noted in the HPI. The remainder of the ROS are negative.   Physical Exam:   BP 145/71  Pulse 68  Temp 97.7 F (36.5 C)  Ht 1.6 m (5' 3")  Wt 77.1 kg (170 lb)  BMI 30.11 kg/m   General: Healthy and alert, in no distress, breathing easily. Normal affect. In a pleasant mood. Head: Normocephalic, atraumatic. No masses, or scars. Eyes: Pupils are equal, and reactive to light. Vision is grossly intact. No spontaneous or gaze nystagmus. Ears: Ear canals are clear. Tympanic membranes are intact, with normal landmarks and the middle ears are clear and healthy. Hearing: Grossly normal. Nose: Nasal cavities are clear with healthy mucosa,  no polyps or exudate. Airways are patent. Face: There is a firm anterior maxillary mass that extends from the upper border of the alveolar bone all the way to the lower border of the infraorbital rim., facial nerve function is symmetric. Oral Cavity: The masses palpable submucosal under the left upper lip. No mucosal abnormalities are noted. Tongue with normal mobility. Dentition appears healthy. Oropharynx: Tonsils are symmetric. There are no mucosal masses identified. Tongue base appears normal and healthy. Larynx/Hypopharynx: deferred Chest: Deferred Neck: No palpable masses, no cervical adenopathy, no thyroid nodules or enlargement. Neuro: Cranial nerves II-XII with normal function. Balance: Normal gate. Other findings: none.  Independent Review of Additional Tests or Records:   Procedures:  none  Impression & Plans:  Anterior maxillary mass of unknown determination. I would like to repeat a biopsy using a Tru-Cut type procedure and will send this for cytopathology as well as flow cytometry. We will try to schedule this as soon as possible at the outpatient center.   

## 2020-02-28 ENCOUNTER — Encounter (HOSPITAL_BASED_OUTPATIENT_CLINIC_OR_DEPARTMENT_OTHER): Payer: Self-pay | Admitting: Otolaryngology

## 2020-02-28 ENCOUNTER — Encounter (HOSPITAL_BASED_OUTPATIENT_CLINIC_OR_DEPARTMENT_OTHER)
Admission: RE | Disposition: A | Discharge status: Home / Self Care | Payer: Self-pay | Source: Home / Self Care | Attending: Otolaryngology

## 2020-02-28 ENCOUNTER — Ambulatory Visit (HOSPITAL_BASED_OUTPATIENT_CLINIC_OR_DEPARTMENT_OTHER)
Admission: RE | Admit: 2020-02-28 | Discharge: 2020-02-28 | Disposition: A | Discharge status: Home / Self Care | Payer: Medicare Other | Attending: Otolaryngology | Admitting: Otolaryngology

## 2020-02-28 ENCOUNTER — Other Ambulatory Visit: Payer: Self-pay

## 2020-02-28 DIAGNOSIS — D479 Neoplasm of uncertain behavior of lymphoid, hematopoietic and related tissue, unspecified: Secondary | ICD-10-CM | POA: Diagnosis not present

## 2020-02-28 DIAGNOSIS — D3709 Neoplasm of uncertain behavior of other specified sites of the oral cavity: Secondary | ICD-10-CM | POA: Diagnosis not present

## 2020-02-28 DIAGNOSIS — R220 Localized swelling, mass and lump, head: Secondary | ICD-10-CM | POA: Insufficient documentation

## 2020-02-28 DIAGNOSIS — R897 Abnormal histological findings in specimens from other organs, systems and tissues: Secondary | ICD-10-CM | POA: Diagnosis not present

## 2020-02-28 HISTORY — DX: Gastro-esophageal reflux disease without esophagitis: K21.9

## 2020-02-28 HISTORY — PX: MASS EXCISION: SHX2000

## 2020-02-28 SURGERY — MINOR EXCISION OF MASS
Anesthesia: LOCAL | Site: Face | Laterality: Left

## 2020-02-28 MED ORDER — LIDOCAINE-EPINEPHRINE 1 %-1:100000 IJ SOLN
INTRAMUSCULAR | Status: DC | PRN
  Administered 2020-02-28: 3 mL

## 2020-02-28 SURGICAL SUPPLY — 48 items
ADH SKN CLS APL DERMABOND .7 (GAUZE/BANDAGES/DRESSINGS)
BAND INSRT 18 STRL LF DISP RB (MISCELLANEOUS)
BAND RUBBER #18 3X1/16 STRL (MISCELLANEOUS) IMPLANT
BLADE SURG 15 STRL LF DISP TIS (BLADE) ×1 IMPLANT
BLADE SURG 15 STRL SS (BLADE)
CANISTER SUCT 1200ML W/VALVE (MISCELLANEOUS) IMPLANT
CLEANER CAUTERY TIP 5X5 PAD (MISCELLANEOUS) IMPLANT
CORD BIPOLAR FORCEPS 12FT (ELECTRODE) IMPLANT
COVER MAYO STAND STRL (DRAPES) ×1 IMPLANT
COVER WAND RF STERILE (DRAPES) IMPLANT
DERMABOND ADVANCED (GAUZE/BANDAGES/DRESSINGS)
DERMABOND ADVANCED .7 DNX12 (GAUZE/BANDAGES/DRESSINGS) IMPLANT
DRAIN PENROSE 1/4X12 LTX STRL (WOUND CARE) IMPLANT
ELECT COATED BLADE 2.86 ST (ELECTRODE) IMPLANT
ELECT REM PT RETURN 9FT ADLT (ELECTROSURGICAL)
ELECTRODE REM PT RTRN 9FT ADLT (ELECTROSURGICAL) IMPLANT
GAUZE SPONGE 4X4 12PLY STRL LF (GAUZE/BANDAGES/DRESSINGS) IMPLANT
GLOVE BIOGEL PI IND STRL 7.0 (GLOVE) IMPLANT
GLOVE BIOGEL PI INDICATOR 7.0 (GLOVE) ×1
GLOVE ECLIPSE 7.5 STRL STRAW (GLOVE) ×2 IMPLANT
GOWN STRL REUS W/ TWL LRG LVL3 (GOWN DISPOSABLE) IMPLANT
GOWN STRL REUS W/TWL LRG LVL3 (GOWN DISPOSABLE)
NDL BIOPSY 14X6 SOFT TISS (NEEDLE) IMPLANT
NDL HYPO 25X1 1.5 SAFETY (NEEDLE) IMPLANT
NDL PRECISIONGLIDE 27X1.5 (NEEDLE) ×1 IMPLANT
NEEDLE BIOPSY 14X6 SOFT TISS (NEEDLE) ×2 IMPLANT
NEEDLE HYPO 25X1 1.5 SAFETY (NEEDLE) IMPLANT
NEEDLE PRECISIONGLIDE 27X1.5 (NEEDLE) IMPLANT
NS IRRIG 1000ML POUR BTL (IV SOLUTION) ×1 IMPLANT
PACK BASIN DAY SURGERY FS (CUSTOM PROCEDURE TRAY) ×2 IMPLANT
PAD CLEANER CAUTERY TIP 5X5 (MISCELLANEOUS)
PENCIL FOOT CONTROL (ELECTRODE) IMPLANT
SHEET MEDIUM DRAPE 40X70 STRL (DRAPES) IMPLANT
SPONGE GAUZE 2X2 8PLY STRL LF (GAUZE/BANDAGES/DRESSINGS) IMPLANT
SUCTION FRAZIER HANDLE 10FR (MISCELLANEOUS)
SUCTION TUBE FRAZIER 10FR DISP (MISCELLANEOUS) IMPLANT
SUT CHROMIC 3 0 PS 2 (SUTURE) IMPLANT
SUT CHROMIC 4 0 P 3 18 (SUTURE) IMPLANT
SUT ETHILON 4 0 PS 2 18 (SUTURE) IMPLANT
SUT ETHILON 5 0 P 3 18 (SUTURE)
SUT NYLON ETHILON 5-0 P-3 1X18 (SUTURE) IMPLANT
SUT PLAIN 5 0 P 3 18 (SUTURE) IMPLANT
SUT SILK 4 0 TIES 17X18 (SUTURE) IMPLANT
SUT VICRYL 4-0 PS2 18IN ABS (SUTURE) IMPLANT
SYR BULB EAR ULCER 3OZ GRN STR (SYRINGE) IMPLANT
SYR CONTROL 10ML LL (SYRINGE) ×2 IMPLANT
TRAY DSU PREP LF (CUSTOM PROCEDURE TRAY) ×1 IMPLANT
TUBE CONNECTING 20X1/4 (TUBING) IMPLANT

## 2020-02-28 NOTE — Discharge Instructions (Signed)
Rinse mouth with salt water several times daily.

## 2020-02-28 NOTE — Op Note (Signed)
OPERATIVE REPORT  DATE OF SURGERY: 02/28/2020  PATIENT:  Haley Wilcox,  86 y.o. female  PRE-OPERATIVE DIAGNOSIS:  Facial Mass  POST-OPERATIVE DIAGNOSIS:  Facial Mass  PROCEDURE:  Procedure(s): Biopsy of Facial Mass  SURGEON:  Beckie Salts, MD  ASSISTANTS: None  ANESTHESIA:   General   EBL: 10 ml  DRAINS: None  LOCAL MEDICATIONS USED: 1% Xylocaine with epinephrine  SPECIMEN: Left facial mass for lymphoma work-up  COUNTS:  Correct  PROCEDURE DETAILS: The patient was taken to the operating room and placed on the operating table in the supine position.  Local anesthetic was infiltrated along the upper gingival labial sulcus mucosa on the left.  A Tru-Cut biopsy syringe was used with multiple passes to biopsy the mass.  She tolerated this well.  Specimens were sent in saline fresh for lymphoma work-up.  A rolled up 4 x 4 gauze was placed into the sulcus for dressing.  Patient was then transferred back to the preop area for discharge.    PATIENT DISPOSITION:  To preop, stable

## 2020-02-28 NOTE — Interval H&P Note (Signed)
History and Physical Interval Note:  02/28/2020 2:07 PM  Haley Wilcox  has presented today for surgery, with the diagnosis of Facial Mass.  The various methods of treatment have been discussed with the patient and family. After consideration of risks, benefits and other options for treatment, the patient has consented to  Procedure(s): Biopsy of Facial Mass (Left) as a surgical intervention.  The patient's history has been reviewed, patient examined, no change in status, stable for surgery.  I have reviewed the patient's chart and labs.  Questions were answered to the patient's satisfaction.     Izora Gala

## 2020-03-04 LAB — SURGICAL PATHOLOGY

## 2020-03-06 DIAGNOSIS — I480 Paroxysmal atrial fibrillation: Secondary | ICD-10-CM | POA: Diagnosis not present

## 2020-03-06 DIAGNOSIS — E1169 Type 2 diabetes mellitus with other specified complication: Secondary | ICD-10-CM | POA: Diagnosis not present

## 2020-03-06 DIAGNOSIS — E7800 Pure hypercholesterolemia, unspecified: Secondary | ICD-10-CM | POA: Diagnosis not present

## 2020-03-06 DIAGNOSIS — I10 Essential (primary) hypertension: Secondary | ICD-10-CM | POA: Diagnosis not present

## 2020-03-06 DIAGNOSIS — E039 Hypothyroidism, unspecified: Secondary | ICD-10-CM | POA: Diagnosis not present

## 2020-03-06 DIAGNOSIS — I2510 Atherosclerotic heart disease of native coronary artery without angina pectoris: Secondary | ICD-10-CM | POA: Diagnosis not present

## 2020-03-06 DIAGNOSIS — I639 Cerebral infarction, unspecified: Secondary | ICD-10-CM | POA: Diagnosis not present

## 2020-03-07 DIAGNOSIS — E119 Type 2 diabetes mellitus without complications: Secondary | ICD-10-CM | POA: Diagnosis not present

## 2020-03-07 DIAGNOSIS — H40023 Open angle with borderline findings, high risk, bilateral: Secondary | ICD-10-CM | POA: Diagnosis not present

## 2020-03-13 ENCOUNTER — Encounter (HOSPITAL_COMMUNITY): Payer: Self-pay | Admitting: Otolaryngology

## 2020-03-14 LAB — SURGICAL PATHOLOGY

## 2020-03-28 DIAGNOSIS — R220 Localized swelling, mass and lump, head: Secondary | ICD-10-CM | POA: Diagnosis not present

## 2020-05-06 DIAGNOSIS — H9113 Presbycusis, bilateral: Secondary | ICD-10-CM | POA: Diagnosis not present

## 2020-05-06 DIAGNOSIS — R220 Localized swelling, mass and lump, head: Secondary | ICD-10-CM | POA: Diagnosis not present

## 2020-05-06 DIAGNOSIS — H6123 Impacted cerumen, bilateral: Secondary | ICD-10-CM | POA: Diagnosis not present

## 2020-05-06 DIAGNOSIS — Z974 Presence of external hearing-aid: Secondary | ICD-10-CM | POA: Diagnosis not present

## 2020-05-09 ENCOUNTER — Telehealth: Payer: Self-pay | Admitting: Cardiology

## 2020-05-09 ENCOUNTER — Other Ambulatory Visit: Payer: Self-pay | Admitting: Cardiology

## 2020-05-09 DIAGNOSIS — I4819 Other persistent atrial fibrillation: Secondary | ICD-10-CM

## 2020-05-09 MED ORDER — ENOXAPARIN SODIUM 30 MG/0.3ML ~~LOC~~ SOLN: 1.0000 mg/kg | Freq: Two times a day (BID) | SUBCUTANEOUS | 0 refills | Status: DC

## 2020-05-09 NOTE — Progress Notes (Signed)
Agree 

## 2020-05-09 NOTE — Telephone Encounter (Signed)
Called pt no answer, left a vm

## 2020-05-09 NOTE — Addendum Note (Signed)
Addended by: Manuela Schwartz T on: 05/09/2020 04:11 PM   Modules accepted: Orders

## 2020-05-09 NOTE — Progress Notes (Signed)
Pt reports most recent weight of 172 lbs (78 kgs) earlier this week when she was at Dr. Janeice Robinson office. Pt with Scr of 0.97 and CrCl: 51.4 mL/min based on labs from 01/25/20. Pt requesting Rx to be sent to her local pharmacy instead. Will pend Rx for Lovenox 80 mg/0.8 mL for #5 syringes to pt's preferred pharmacy for a course of 2 days injections prior to the scheduled surgery on 05/22/20. Reviewed Eliquis hold instruction and appropriate Lovenox injections with the pt over the phone. 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 Will pend Rx for Dr. Virgina Jock. Will call [facility] to cancel prior Lovenox injection order.

## 2020-05-09 NOTE — Telephone Encounter (Signed)
Please let the patient know that I have recommended the following for her upcoming ENT surgery  2 days before surgery, Stop eliquis Start lovenox injections twice a day (prescription sen)  Day after surgery, Resume eliquis Stop lovenox  Thanks MJP

## 2020-05-14 MED ORDER — ENOXAPARIN SODIUM 80 MG/0.8ML ~~LOC~~ SOLN: 80.0000 mg | Freq: Two times a day (BID) | SUBCUTANEOUS | 1 refills | Status: DC

## 2020-05-14 NOTE — Addendum Note (Signed)
Addended by: Manuela Schwartz T on: 05/14/2020 09:47 AM   Modules accepted: Orders

## 2020-05-16 DIAGNOSIS — I480 Paroxysmal atrial fibrillation: Secondary | ICD-10-CM | POA: Diagnosis not present

## 2020-05-16 DIAGNOSIS — E7800 Pure hypercholesterolemia, unspecified: Secondary | ICD-10-CM | POA: Diagnosis not present

## 2020-05-16 DIAGNOSIS — I639 Cerebral infarction, unspecified: Secondary | ICD-10-CM | POA: Diagnosis not present

## 2020-05-16 DIAGNOSIS — I10 Essential (primary) hypertension: Secondary | ICD-10-CM | POA: Diagnosis not present

## 2020-05-16 DIAGNOSIS — E1129 Type 2 diabetes mellitus with other diabetic kidney complication: Secondary | ICD-10-CM | POA: Diagnosis not present

## 2020-05-16 DIAGNOSIS — E039 Hypothyroidism, unspecified: Secondary | ICD-10-CM | POA: Diagnosis not present

## 2020-05-16 DIAGNOSIS — E119 Type 2 diabetes mellitus without complications: Secondary | ICD-10-CM | POA: Diagnosis not present

## 2020-05-16 DIAGNOSIS — I2510 Atherosclerotic heart disease of native coronary artery without angina pectoris: Secondary | ICD-10-CM | POA: Diagnosis not present

## 2020-05-20 ENCOUNTER — Other Ambulatory Visit (HOSPITAL_COMMUNITY)
Admission: RE | Admit: 2020-05-20 | Discharge: 2020-05-20 | Disposition: A | Discharge status: Home / Self Care | Payer: Medicare Other | Source: Ambulatory Visit | Attending: Otolaryngology | Admitting: Otolaryngology

## 2020-05-20 DIAGNOSIS — Z01812 Encounter for preprocedural laboratory examination: Secondary | ICD-10-CM | POA: Insufficient documentation

## 2020-05-20 DIAGNOSIS — Z20822 Contact with and (suspected) exposure to covid-19: Secondary | ICD-10-CM | POA: Insufficient documentation

## 2020-05-20 NOTE — Telephone Encounter (Signed)
Pt was aware

## 2020-05-21 ENCOUNTER — Encounter (HOSPITAL_COMMUNITY): Payer: Self-pay | Admitting: Otolaryngology

## 2020-05-21 HISTORY — PX: TUMOR REMOVAL: SHX12

## 2020-05-21 LAB — SARS CORONAVIRUS 2 (TAT 6-24 HRS): SARS Coronavirus 2: NEGATIVE

## 2020-05-21 NOTE — Progress Notes (Signed)
Spoke with pt for pre-op call. Pt denies cardiac history. Is treated for HTN and had a stroke (mild) in 2020. Pt has been on Eliquis since. Dr. Virgina Jock instructed to hold her Eliquis 2 days prior to surgery (last dose was 05/19/20 PM dose) and start Lovenox 2 days prior to surgery (first dose was 05/20/20 AM dose and last dose will be tonight, 05/21/20)  Pt is a type 2 diabetic. Last A1C was 6.9 on 03/06/20. Pt states she does not check her blood sugar at home.  Covid test done 05/20/20 and it's negative. Pt states she's been in quarantine since the test was done and understands that she stays in quarantine until she comes to the hospital tomorrow.

## 2020-05-22 ENCOUNTER — Observation Stay (HOSPITAL_COMMUNITY)
Admission: RE | Admit: 2020-05-22 | Discharge: 2020-05-23 | Disposition: A | Discharge status: Home / Self Care | Payer: Medicare Other | Attending: Otolaryngology | Admitting: Otolaryngology

## 2020-05-22 ENCOUNTER — Encounter (HOSPITAL_COMMUNITY)
Admission: RE | Disposition: A | Discharge status: Home / Self Care | Payer: Self-pay | Source: Home / Self Care | Attending: Otolaryngology

## 2020-05-22 ENCOUNTER — Inpatient Hospital Stay (HOSPITAL_COMMUNITY): Payer: Medicare Other | Admitting: Anesthesiology

## 2020-05-22 ENCOUNTER — Other Ambulatory Visit: Payer: Self-pay

## 2020-05-22 ENCOUNTER — Encounter (HOSPITAL_COMMUNITY): Payer: Self-pay | Admitting: Otolaryngology

## 2020-05-22 DIAGNOSIS — Z7901 Long term (current) use of anticoagulants: Secondary | ICD-10-CM | POA: Insufficient documentation

## 2020-05-22 DIAGNOSIS — R220 Localized swelling, mass and lump, head: Secondary | ICD-10-CM | POA: Diagnosis not present

## 2020-05-22 DIAGNOSIS — D479 Neoplasm of uncertain behavior of lymphoid, hematopoietic and related tissue, unspecified: Principal | ICD-10-CM | POA: Insufficient documentation

## 2020-05-22 DIAGNOSIS — Z79899 Other long term (current) drug therapy: Secondary | ICD-10-CM | POA: Insufficient documentation

## 2020-05-22 DIAGNOSIS — I10 Essential (primary) hypertension: Secondary | ICD-10-CM | POA: Insufficient documentation

## 2020-05-22 DIAGNOSIS — D3709 Neoplasm of uncertain behavior of other specified sites of the oral cavity: Secondary | ICD-10-CM | POA: Diagnosis not present

## 2020-05-22 DIAGNOSIS — D47Z9 Other specified neoplasms of uncertain behavior of lymphoid, hematopoietic and related tissue: Secondary | ICD-10-CM | POA: Diagnosis not present

## 2020-05-22 DIAGNOSIS — E782 Mixed hyperlipidemia: Secondary | ICD-10-CM | POA: Diagnosis not present

## 2020-05-22 DIAGNOSIS — E559 Vitamin D deficiency, unspecified: Secondary | ICD-10-CM | POA: Diagnosis not present

## 2020-05-22 DIAGNOSIS — I4819 Other persistent atrial fibrillation: Secondary | ICD-10-CM | POA: Diagnosis not present

## 2020-05-22 HISTORY — DX: Pneumonia, unspecified organism: J18.9

## 2020-05-22 HISTORY — PX: EXCISION MASS HEAD: SHX6702

## 2020-05-22 LAB — BASIC METABOLIC PANEL
Anion gap: 8 (ref 5–15)
BUN: 18 mg/dL (ref 8–23)
CO2: 26 mmol/L (ref 22–32)
Calcium: 9.9 mg/dL (ref 8.9–10.3)
Chloride: 105 mmol/L (ref 98–111)
Creatinine, Ser: 0.9 mg/dL (ref 0.44–1.00)
GFR, Estimated: >60 mL/min (ref >60)
Glucose, Bld: 132 mg/dL — ABNORMAL HIGH (ref 70–99)
Potassium: 4.1 mmol/L (ref 3.5–5.1)
Sodium: 139 mmol/L (ref 135–145)

## 2020-05-22 LAB — CBC
HCT: 35.7 % — ABNORMAL LOW (ref 36.0–46.0)
Hemoglobin: 11.4 g/dL — ABNORMAL LOW (ref 12.0–15.0)
MCH: 28.1 pg (ref 26.0–34.0)
MCHC: 31.9 g/dL (ref 30.0–36.0)
MCV: 87.9 fL (ref 80.0–100.0)
Platelets: 225 10*3/uL (ref 150–400)
RBC: 4.06 MIL/uL (ref 3.87–5.11)
RDW: 15.5 % (ref 11.5–15.5)
WBC: 7.6 10*3/uL (ref 4.0–10.5)
nRBC: 0 % (ref 0.0–0.2)

## 2020-05-22 LAB — GLUCOSE, CAPILLARY
Glucose-Capillary: 124 mg/dL — ABNORMAL HIGH (ref 70–99)
Glucose-Capillary: 131 mg/dL — ABNORMAL HIGH (ref 70–99)
Glucose-Capillary: 155 mg/dL — ABNORMAL HIGH (ref 70–99)
Glucose-Capillary: 166 mg/dL — ABNORMAL HIGH (ref 70–99)

## 2020-05-22 LAB — HEMOGLOBIN A1C
Hgb A1c MFr Bld: 7.1 % — ABNORMAL HIGH (ref 4.8–5.6)
Mean Plasma Glucose: 157.07 mg/dL

## 2020-05-22 SURGERY — EXCISION, MASS, HEAD
Anesthesia: General | Laterality: Left

## 2020-05-22 MED ORDER — DOXAZOSIN MESYLATE 2 MG PO TABS
2.0000 mg | ORAL_TABLET | Freq: Every evening | ORAL | Status: DC
Start: 2020-05-22 — End: 2020-05-23
  Administered 2020-05-22: 2 mg via ORAL
  Filled 2020-05-22: qty 1

## 2020-05-22 MED ORDER — BACITRACIN ZINC 500 UNIT/GM EX OINT
TOPICAL_OINTMENT | CUTANEOUS | Status: DC | PRN
  Administered 2020-05-22: 1 via TOPICAL

## 2020-05-22 MED ORDER — EPHEDRINE SULFATE-NACL 50-0.9 MG/10ML-% IV SOSY
PREFILLED_SYRINGE | INTRAVENOUS | Status: DC | PRN
  Administered 2020-05-22 (×4): 5 mg via INTRAVENOUS

## 2020-05-22 MED ORDER — SUGAMMADEX SODIUM 200 MG/2ML IV SOLN
INTRAVENOUS | Status: DC | PRN
  Administered 2020-05-22: 200 mg via INTRAVENOUS

## 2020-05-22 MED ORDER — FENTANYL CITRATE (PF) 250 MCG/5ML IJ SOLN
INTRAMUSCULAR | Status: AC
  Filled 2020-05-22: qty 5

## 2020-05-22 MED ORDER — AMLODIPINE BESYLATE 5 MG PO TABS
10.0000 mg | ORAL_TABLET | Freq: Every day | ORAL | Status: DC
  Administered 2020-05-23: 10 mg via ORAL
  Filled 2020-05-22: qty 2

## 2020-05-22 MED ORDER — ROSUVASTATIN CALCIUM 5 MG PO TABS
5.0000 mg | ORAL_TABLET | Freq: Every day | ORAL | Status: DC
  Administered 2020-05-23: 5 mg via ORAL
  Filled 2020-05-22: qty 1

## 2020-05-22 MED ORDER — PANTOPRAZOLE SODIUM 40 MG PO TBEC
40.0000 mg | DELAYED_RELEASE_TABLET | Freq: Every day | ORAL | Status: DC
  Administered 2020-05-23: 40 mg via ORAL
  Filled 2020-05-22 (×2): qty 1

## 2020-05-22 MED ORDER — CLINDAMYCIN PALMITATE HCL 75 MG/5ML PO SOLR
300.0000 mg | Freq: Three times a day (TID) | ORAL | Status: DC
  Administered 2020-05-22 – 2020-05-23 (×3): 300 mg via ORAL
  Filled 2020-05-22 (×6): qty 20

## 2020-05-22 MED ORDER — FENTANYL CITRATE (PF) 100 MCG/2ML IJ SOLN
INTRAMUSCULAR | Status: DC | PRN
  Administered 2020-05-22: 50 ug via INTRAVENOUS

## 2020-05-22 MED ORDER — PHENYLEPHRINE HCL-NACL 10-0.9 MG/250ML-% IV SOLN
INTRAVENOUS | Status: DC | PRN
  Administered 2020-05-22: 50 ug/min via INTRAVENOUS

## 2020-05-22 MED ORDER — CARVEDILOL 12.5 MG PO TABS
12.5000 mg | ORAL_TABLET | Freq: Two times a day (BID) | ORAL | Status: DC
  Administered 2020-05-22 – 2020-05-23 (×2): 12.5 mg via ORAL
  Filled 2020-05-22 (×3): qty 1

## 2020-05-22 MED ORDER — POLYETHYLENE GLYCOL 3350 17 G PO PACK
17.0000 g | PACK | Freq: Two times a day (BID) | ORAL | Status: DC
  Administered 2020-05-22 – 2020-05-23 (×2): 17 g via ORAL
  Filled 2020-05-22 (×2): qty 1

## 2020-05-22 MED ORDER — ORAL CARE MOUTH RINSE: 15.0000 mL | Freq: Once | OROMUCOSAL | Status: DC

## 2020-05-22 MED ORDER — LIDOCAINE 2% (20 MG/ML) 5 ML SYRINGE
INTRAMUSCULAR | Status: DC | PRN
  Administered 2020-05-22: 60 mg via INTRAVENOUS

## 2020-05-22 MED ORDER — ADULT MULTIVITAMIN W/MINERALS CH
1.0000 | ORAL_TABLET | Freq: Every day | ORAL | Status: DC
  Administered 2020-05-22 – 2020-05-23 (×2): 1 via ORAL
  Filled 2020-05-22 (×2): qty 1

## 2020-05-22 MED ORDER — LIDOCAINE-EPINEPHRINE 1 %-1:100000 IJ SOLN
INTRAMUSCULAR | Status: DC | PRN
  Administered 2020-05-22: 9 mL

## 2020-05-22 MED ORDER — CLINDAMYCIN PHOSPHATE 600 MG/50ML IV SOLN
600.0000 mg | Freq: Once | INTRAVENOUS | Status: DC
  Filled 2020-05-22: qty 50

## 2020-05-22 MED ORDER — LACTATED RINGERS IV SOLN: INTRAVENOUS | Status: DC

## 2020-05-22 MED ORDER — POTASSIUM CHLORIDE 2 MEQ/ML IV SOLN
INTRAVENOUS | Status: DC
  Filled 2020-05-22 (×3): qty 1000

## 2020-05-22 MED ORDER — ONDANSETRON HCL 4 MG PO TABS: 4.0000 mg | ORAL_TABLET | ORAL | Status: DC | PRN

## 2020-05-22 MED ORDER — ONDANSETRON HCL 4 MG/2ML IJ SOLN
INTRAMUSCULAR | Status: DC | PRN
  Administered 2020-05-22: 4 mg via INTRAVENOUS

## 2020-05-22 MED ORDER — BACITRACIN ZINC 500 UNIT/GM EX OINT
1.0000 "application " | TOPICAL_OINTMENT | Freq: Three times a day (TID) | CUTANEOUS | Status: DC
  Administered 2020-05-22 – 2020-05-23 (×3): 1 via TOPICAL

## 2020-05-22 MED ORDER — HYDROCODONE-ACETAMINOPHEN 5-325 MG PO TABS
1.0000 | ORAL_TABLET | ORAL | Status: DC | PRN
  Filled 2020-05-22: qty 2

## 2020-05-22 MED ORDER — ROCURONIUM BROMIDE 10 MG/ML (PF) SYRINGE
PREFILLED_SYRINGE | INTRAVENOUS | Status: DC | PRN
  Administered 2020-05-22: 50 mg via INTRAVENOUS
  Administered 2020-05-22: 20 mg via INTRAVENOUS

## 2020-05-22 MED ORDER — DEXAMETHASONE SODIUM PHOSPHATE 4 MG/ML IJ SOLN
INTRAMUSCULAR | Status: DC | PRN
  Administered 2020-05-22: 5 mg via INTRAVENOUS

## 2020-05-22 MED ORDER — PROPOFOL 10 MG/ML IV BOLUS
INTRAVENOUS | Status: AC
  Filled 2020-05-22: qty 20

## 2020-05-22 MED ORDER — SPIRONOLACTONE 25 MG PO TABS
25.0000 mg | ORAL_TABLET | Freq: Every day | ORAL | Status: DC
  Administered 2020-05-22 – 2020-05-23 (×2): 25 mg via ORAL
  Filled 2020-05-22 (×2): qty 1

## 2020-05-22 MED ORDER — POLYETHYLENE GLYCOL 3350 17 G PO PACK: 17.0000 g | PACK | Freq: Four times a day (QID) | ORAL | Status: DC | PRN

## 2020-05-22 MED ORDER — ONDANSETRON HCL 4 MG/2ML IJ SOLN
4.0000 mg | INTRAMUSCULAR | Status: DC | PRN
Start: 2020-05-22 — End: 2020-05-23

## 2020-05-22 MED ORDER — FENTANYL CITRATE (PF) 100 MCG/2ML IJ SOLN
25.0000 ug | INTRAMUSCULAR | Status: DC | PRN
Start: 2020-05-22 — End: 2020-05-22

## 2020-05-22 MED ORDER — CHLORHEXIDINE GLUCONATE 0.12 % MT SOLN: 15.0000 mL | Freq: Once | OROMUCOSAL | Status: DC

## 2020-05-22 MED ORDER — BACITRACIN ZINC 500 UNIT/GM EX OINT
TOPICAL_OINTMENT | CUTANEOUS | Status: AC
  Filled 2020-05-22: qty 28.35

## 2020-05-22 MED ORDER — POLYETHYLENE GLYCOL 3350 17 GM/SCOOP PO POWD: 17.0000 g | Freq: Every day | ORAL | Status: DC | PRN

## 2020-05-22 MED ORDER — LIDOCAINE HCL 1 % IJ SOLN
INTRAMUSCULAR | Status: AC
  Filled 2020-05-22: qty 20

## 2020-05-22 MED ORDER — CLINDAMYCIN PHOSPHATE 600 MG/50ML IV SOLN
INTRAVENOUS | Status: DC | PRN
  Administered 2020-05-22: 600 mg via INTRAVENOUS

## 2020-05-22 MED ORDER — 0.9 % SODIUM CHLORIDE (POUR BTL) OPTIME
TOPICAL | Status: DC | PRN
  Administered 2020-05-22: 1000 mL

## 2020-05-22 MED ORDER — WHITE PETROLATUM EX OINT
TOPICAL_OINTMENT | CUTANEOUS | Status: AC
  Filled 2020-05-22: qty 28.35

## 2020-05-22 MED ORDER — INSULIN ASPART 100 UNIT/ML ~~LOC~~ SOLN
0.0000 [IU] | Freq: Three times a day (TID) | SUBCUTANEOUS | Status: DC
  Administered 2020-05-22: 3 [IU] via SUBCUTANEOUS
  Administered 2020-05-23: 2 [IU] via SUBCUTANEOUS

## 2020-05-22 MED ORDER — ONDANSETRON HCL 4 MG/2ML IJ SOLN: 4.0000 mg | Freq: Once | INTRAMUSCULAR | Status: DC | PRN

## 2020-05-22 MED ORDER — IBUPROFEN 100 MG/5ML PO SUSP
400.0000 mg | Freq: Four times a day (QID) | ORAL | Status: DC | PRN
Start: 2020-05-22 — End: 2020-05-23

## 2020-05-22 MED ORDER — LEVOTHYROXINE SODIUM 25 MCG PO TABS
25.0000 ug | ORAL_TABLET | Freq: Every day | ORAL | Status: DC
  Administered 2020-05-23: 25 ug via ORAL
  Filled 2020-05-22: qty 1

## 2020-05-22 MED ORDER — PROPOFOL 10 MG/ML IV BOLUS
INTRAVENOUS | Status: DC | PRN
  Administered 2020-05-22: 110 mg via INTRAVENOUS

## 2020-05-22 MED ORDER — METFORMIN HCL 500 MG PO TABS
500.0000 mg | ORAL_TABLET | Freq: Two times a day (BID) | ORAL | Status: DC
  Administered 2020-05-22 – 2020-05-23 (×2): 500 mg via ORAL
  Filled 2020-05-22 (×2): qty 1

## 2020-05-22 MED ORDER — EPINEPHRINE PF 1 MG/ML IJ SOLN
INTRAMUSCULAR | Status: AC
  Filled 2020-05-22: qty 1

## 2020-05-22 MED ORDER — OXYCODONE HCL 5 MG PO TABS: 5.0000 mg | ORAL_TABLET | Freq: Once | ORAL | Status: DC | PRN

## 2020-05-22 MED ORDER — LOSARTAN POTASSIUM 50 MG PO TABS
100.0000 mg | ORAL_TABLET | Freq: Every day | ORAL | Status: DC
Start: 2020-05-22 — End: 2020-05-23
  Administered 2020-05-22 – 2020-05-23 (×2): 100 mg via ORAL
  Filled 2020-05-22 (×2): qty 2

## 2020-05-22 MED ORDER — OXYCODONE HCL 5 MG/5ML PO SOLN: 5.0000 mg | Freq: Once | ORAL | Status: DC | PRN

## 2020-05-22 SURGICAL SUPPLY — 53 items
APPLIER CLIP 9.375 SM OPEN (CLIP)
APR CLP SM 9.3 20 MLT OPN (CLIP)
BLADE SURG 15 STRL LF DISP TIS (BLADE) IMPLANT
BLADE SURG 15 STRL SS (BLADE) ×2
CANISTER SUCT 3000ML PPV (MISCELLANEOUS) ×2 IMPLANT
CLEANER TIP ELECTROSURG 2X2 (MISCELLANEOUS) ×2 IMPLANT
CLIP APPLIE 9.375 SM OPEN (CLIP) IMPLANT
CNTNR URN SCR LID CUP LEK RST (MISCELLANEOUS) IMPLANT
CONT SPEC 4OZ STRL OR WHT (MISCELLANEOUS) ×2
CORD BIPOLAR FORCEPS 12FT (ELECTRODE) ×2 IMPLANT
COVER SURGICAL LIGHT HANDLE (MISCELLANEOUS) ×2 IMPLANT
COVER WAND RF STERILE (DRAPES) ×1 IMPLANT
DRAIN CHANNEL 15F RND FF W/TCR (WOUND CARE) IMPLANT
DRAIN SNY 10 ROU (WOUND CARE) IMPLANT
DRAPE HALF SHEET 40X57 (DRAPES) IMPLANT
DRAPE INCISE 23X17 IOBAN STRL (DRAPES)
DRAPE INCISE 23X17 STRL (DRAPES) IMPLANT
DRAPE INCISE IOBAN 23X17 STRL (DRAPES) IMPLANT
ELECT COATED BLADE 2.86 ST (ELECTRODE) ×2 IMPLANT
ELECT REM PT RETURN 9FT ADLT (ELECTROSURGICAL) ×2
ELECTRODE REM PT RTRN 9FT ADLT (ELECTROSURGICAL) ×1 IMPLANT
EVACUATOR SILICONE 100CC (DRAIN) ×1 IMPLANT
FORCEPS BIPOLAR SPETZLER 8 1.0 (NEUROSURGERY SUPPLIES) IMPLANT
GAUZE 4X4 16PLY RFD (DISPOSABLE) IMPLANT
GLOVE ECLIPSE 7.5 STRL STRAW (GLOVE) ×2 IMPLANT
GOWN STRL REUS W/ TWL LRG LVL3 (GOWN DISPOSABLE) ×2 IMPLANT
GOWN STRL REUS W/TWL LRG LVL3 (GOWN DISPOSABLE) ×4
KIT BASIN OR (CUSTOM PROCEDURE TRAY) ×2 IMPLANT
KIT TURNOVER KIT B (KITS) ×2 IMPLANT
LOCATOR NERVE 3 VOLT (DISPOSABLE) IMPLANT
NDL PRECISIONGLIDE 27X1.5 (NEEDLE) ×1 IMPLANT
NEEDLE PRECISIONGLIDE 27X1.5 (NEEDLE) ×2 IMPLANT
NS IRRIG 1000ML POUR BTL (IV SOLUTION) ×2 IMPLANT
PAD ARMBOARD 7.5X6 YLW CONV (MISCELLANEOUS) ×4 IMPLANT
PENCIL FOOT CONTROL (ELECTRODE) ×2 IMPLANT
SPECIMEN JAR MEDIUM (MISCELLANEOUS) IMPLANT
SPONGE INTESTINAL PEANUT (DISPOSABLE) IMPLANT
SPONGE LAP 18X18 RF (DISPOSABLE) IMPLANT
STAPLER VISISTAT 35W (STAPLE) ×2 IMPLANT
SUT CHROMIC 3 0 SH 27 (SUTURE) IMPLANT
SUT CHROMIC 5 0 P 3 (SUTURE) IMPLANT
SUT ETHILON 3 0 PS 1 (SUTURE) IMPLANT
SUT ETHILON 5 0 PS 2 18 (SUTURE) IMPLANT
SUT SILK 2 0 REEL (SUTURE) IMPLANT
SUT SILK 3 0 SH CR/8 (SUTURE) ×1 IMPLANT
SUT SILK 4 0 REEL (SUTURE) IMPLANT
SUT VIC AB 3-0 SH 18 (SUTURE) ×1 IMPLANT
SUT VICRYL RAPIDE 4/0 PS 2 (SUTURE) ×1 IMPLANT
TOWEL GREEN STERILE FF (TOWEL DISPOSABLE) ×2 IMPLANT
TRAY ENT MC OR (CUSTOM PROCEDURE TRAY) ×2 IMPLANT
TRAY FOLEY MTR SLVR 14FR STAT (SET/KITS/TRAYS/PACK) IMPLANT
TUBE FEEDING 10FR FLEXIFLO (MISCELLANEOUS) IMPLANT
WATER STERILE IRR 1000ML POUR (IV SOLUTION) IMPLANT

## 2020-05-22 NOTE — Progress Notes (Signed)
ENT Post Operative Note  Subjective: No nausea, vomiting No difficulty voiding Pain well controlled Tolerating PO intake  Vitals:   05/22/20 1206 05/22/20 1233  BP: 128/70 (!) 141/82  Pulse: 70 73  Resp: 17 16  Temp: (!) 97.1 F (36.2 C) 97.7 F (36.5 C)  SpO2: 96% 95%     OBJECTIVE  Gen: alert, cooperative, appropriate Head/ENT: EOMI, neck supple, mucus membranes moist and pink, conjunctiva clear. Penrose drain secured intraorally, no active drainage noted Incisions c/d/i, no evidence of hematoma or seroma  Face moves symmetrically Respiratory: Voice without dysphonia. non-labored breathing, no accessory muscle use, normal HR, good O2 saturations   ASSESS/ PLAN  Haley Wilcox is a 87 y.o. female who is POD #0 s/p Sublabial Approach Resection of Left Maxillary Mass  -Pain control -Bacitracin ointment to left facial incision Q8H -Cleocin Q8H -Likely DC in AM  Please do not hesitate to contact me with any questions or concerns.   Jason Coop, [facility] Cell: [phone]

## 2020-05-22 NOTE — Transfer of Care (Signed)
Immediate Anesthesia Transfer of Care Note  Patient: Haley Wilcox  Procedure(s) Performed: Sublabial Approach Resection of Left Maxillary Mass (Left )  Patient Location: PACU  Anesthesia Type:General  Level of Consciousness: awake and alert   Airway & Oxygen Therapy: Patient Spontanous Breathing and Patient connected to face mask oxygen  Post-op Assessment: Report given to RN and Post -op Vital signs reviewed and stable  Post vital signs: Reviewed and stable  Last Vitals:  Vitals Value Taken Time  BP 132/65 05/22/20 1012  Temp    Pulse 72 05/22/20 1014  Resp 18 05/22/20 1014  SpO2 99 % 05/22/20 1014  Vitals shown include unvalidated device data.  Last Pain:  Vitals:   05/22/20 0714  TempSrc:   PainSc: 0-No pain         Complications: No complications documented.

## 2020-05-22 NOTE — Anesthesia Preprocedure Evaluation (Signed)
Anesthesia Evaluation  Patient identified by MRN, date of birth, ID band Patient awake    Reviewed: Allergy & Precautions, NPO status , Patient's Chart, lab work & pertinent test results  Airway Mallampati: II  TM Distance: >3 FB Neck ROM: Full   Comment: L. Maxillary enlargement Dental  (+) Teeth Intact, Dental Advisory Given   Pulmonary    breath sounds clear to auscultation       Cardiovascular hypertension,  Rhythm:Regular Rate:Normal     Neuro/Psych    GI/Hepatic   Endo/Other  diabetes  Renal/GU      Musculoskeletal   Abdominal   Peds  Hematology   Anesthesia Other Findings   Reproductive/Obstetrics                             Anesthesia Physical Anesthesia Plan  ASA: III  Anesthesia Plan: General   Post-op Pain Management:    Induction: Intravenous  PONV Risk Score and Plan: Ondansetron and Metaclopromide  Airway Management Planned: Oral ETT  Additional Equipment:   Intra-op Plan:   Post-operative Plan: Extubation in OR  Informed Consent: I have reviewed the patients History and Physical, chart, labs and discussed the procedure including the risks, benefits and alternatives for the proposed anesthesia with the patient or authorized representative who has indicated his/her understanding and acceptance.     Dental advisory given  Plan Discussed with: Anesthesiologist  Anesthesia Plan Comments: (L. Maxillary mass Type 2 DM glucose- 124 Htn on carvedilol, doxazosin, losartan, and spironolactone  Plan GA with oral ETT)        Anesthesia Quick Evaluation

## 2020-05-22 NOTE — Anesthesia Procedure Notes (Signed)
Procedure Name: Intubation Date/Time: 05/22/2020 8:47 AM Performed by: Lieutenant Diego, CRNA Pre-anesthesia Checklist: Patient identified, Emergency Drugs available, Suction available and Patient being monitored Patient Re-evaluated:Patient Re-evaluated prior to induction Oxygen Delivery Method: Circle system utilized Preoxygenation: Pre-oxygenation with 100% oxygen Induction Type: IV induction Ventilation: Mask ventilation without difficulty Laryngoscope Size: Miller and 2 Grade View: Grade I Tube type: Oral Tube size: 7.0 mm Number of attempts: 1 Airway Equipment and Method: Stylet Placement Confirmation: ETT inserted through vocal cords under direct vision,  positive ETCO2 and breath sounds checked- equal and bilateral Secured at: 22 cm Tube secured with: Tape Dental Injury: Teeth and Oropharynx as per pre-operative assessment

## 2020-05-22 NOTE — Anesthesia Postprocedure Evaluation (Signed)
Anesthesia Post Note  Patient: Haley Wilcox  Procedure(s) Performed: Sublabial Approach Resection of Left Maxillary Mass (Left )     Patient location during evaluation: PACU Anesthesia Type: General Level of consciousness: awake and alert Pain management: pain level controlled Vital Signs Assessment: post-procedure vital signs reviewed and stable Respiratory status: spontaneous breathing, nonlabored ventilation, respiratory function stable and patient connected to nasal cannula oxygen Cardiovascular status: blood pressure returned to baseline and stable Postop Assessment: no apparent nausea or vomiting Anesthetic complications: no   No complications documented.  Last Vitals:  Vitals:   05/22/20 1206 05/22/20 1233  BP: 128/70 (!) 141/82  Pulse: 70 73  Resp: 17 16  Temp: (!) 36.2 C 36.5 C  SpO2: 96% 95%    Last Pain:  Vitals:   05/22/20 1233  TempSrc:   PainSc: 0-No pain                 Aretha Levi COKER

## 2020-05-22 NOTE — Op Note (Signed)
OPERATIVE REPORT  DATE OF SURGERY: 05/22/2020  PATIENT:  Haley Wilcox,  87 y.o. female  PRE-OPERATIVE DIAGNOSIS:  Facial/maxillary mass, left   POST-OPERATIVE DIAGNOSIS: Same  PROCEDURE:  Procedure(s): Sublabial Approach Resection of Left Maxillary Mass  SURGEON:  Beckie Salts, MD  ASSISTANTS: None  ANESTHESIA:   General   EBL: 40 ml  DRAINS: Quarter-inch Penrose  LOCAL MEDICATIONS USED: 1% Xylocaine with epinephrine  SPECIMEN: Left maxillary/facial mass sent fresh for pathologic evaluation.  COUNTS:  Correct  PROCEDURE DETAILS: The patient was taken to the operating room and placed on the operating table in the supine position. Following induction of general endotracheal anesthesia, the left side of the face was prepped and draped in standard fashion.  Incision was outlined along the left nasal alar crease down the philtrum and into the upper lip to the left of midline.  Local anesthetic was infiltrated as well as in the sublabial mucosa.  A #15 scalpel was used to incise the skin and electrocautery was used to incise the mucosa.  A flap was then elevated superiorly exposing the maxillary mass.  The mass was then dissected free of surrounding tissue.  Dissection was continued all the way down to the face of the maxillary bone which was intact.  The lesion dissected off the bone easily.  The infraorbital nerve was identified and sacrificed at the foramen.  The specimen was sent fresh for pathologic valuation.  Bipolar cautery and electrocautery was used for hemostasis.  The wound was irrigated.  The skin incision was then realigned carefully and reapproximated with running 4-0 Rapide suture.  The deep layer was reapproximated with interrupted 3-0 Vicryl.  The lip vermilion was reapproximated with a running inverted 3-0 Vicryl.  The sublabial mucosa was reapproximated with running 3-0 chromic.  The drain was left in the wound exiting through the lateral aspect of the sublabial  incision secured in place with a chromic suture.  Bacitracin was applied to the skin and lip.  Patient was awakened extubated and transferred to recovery in stable condition.  The lesion measured approximately 6 cm in greatest dimension and was firm and rubbery.    PATIENT DISPOSITION:  To PACU, stable

## 2020-05-22 NOTE — H&P (Signed)
HPI:   Haley Wilcox is a 87 y.o. female who presents as a consult Patient.   Referring Provider: Burnard Leigh, *  Chief complaint: Facial mass.  HPI: Generally healthy, retired [employer] scientist, was found by her dentist to have a left anterior cheek mass. Biopsy was done and sent to [facility]. Initially was read as non-Hodgkin's lymphoma but then with further testing revealed that it was polyclonal. The final diagnosis was lymphoproliferative process, Not consistent with lymphoma. She has some residual numbness around the upper lip from the biopsy but otherwise no symptoms. CT scan of the neck and chest was all negative.  PMH/Meds/All/SocHx/FamHx/ROS:   Past Medical History:  Diagnosis Date  . Gout  . Hypertension   Past Surgical History:  Procedure Laterality Date  . CATARACT EXTRACTION, BILATERAL  . DILATION AND CURETTAGE OF UTERUS  . HYSTERECTOMY   No family history of bleeding disorders, wound healing problems or difficulty with anesthesia.   Social History   Socioeconomic History  . Marital status: Married  Spouse name: Not on file  . Number of children: Not on file  . Years of education: Not on file  . Highest education level: Not on file  Occupational History  . Not on file  Tobacco Use  . Smoking status: Never Smoker  . Smokeless tobacco: Never Used  Vaping Use  . Vaping Use: Never used  Substance and Sexual Activity  . Alcohol use: Not on file  . Drug use: Not on file  . Sexual activity: Not on file  Other Topics Concern  . Not on file  Social History Narrative  . Not on file   Social Determinants of Health   Financial Resource Strain:  . Difficulty of Paying Living Expenses: Not on file  Food Insecurity:  . Worried About Charity fundraiser in the Last Year: Not on file  . Ran Out of Food in the Last Year: Not on file  Transportation Needs:  . Lack of Transportation (Medical): Not on file  . Lack of Transportation (Non-Medical): Not on file   Physical Activity:  . Days of Exercise per Week: Not on file  . Minutes of Exercise per Session: Not on file  Stress:  . Feeling of Stress : Not on file  Social Connections:  . Frequency of Communication with Friends and Family: Not on file  . Frequency of Social Gatherings with Friends and Family: Not on file  . Attends Religious Services: Not on file  . Active Member of Clubs or Organizations: Not on file  . Attends Archivist Meetings: Not on file  . Marital Status: Not on file   Current Outpatient Medications:  . amLODIPine (NORVASC) 10 MG tablet, , Disp: , Rfl:  . carvediloL (COREG) 12.5 MG tablet, , Disp: , Rfl:  . colchicine 0.6 mg tablet, , Disp: , Rfl:  . doxazosin (CARDURA) 2 MG tablet, , Disp: , Rfl:  . ELIQUIS 5 mg tablet *ANTICOAGULANT*, , Disp: , Rfl:  . ergocalciferol (VITAMIN D2) 1,250 mcg (50,000 unit) capsule, , Disp: , Rfl:  . levothyroxine (SYNTHROID) 25 MCG tablet, , Disp: , Rfl:  . losartan (COZAAR) 100 MG tablet, , Disp: , Rfl:  . metFORMIN (GLUCOPHAGE) 500 MG tablet, , Disp: , Rfl:  . predniSONE (DELTASONE) 10 MG tablet, , Disp: , Rfl:  . rosuvastatin (CRESTOR) 10 MG tablet, , Disp: , Rfl:  . spironolactone (ALDACTONE) 25 MG tablet, , Disp: , Rfl:   A complete ROS was performed with  pertinent positives/negatives noted in the HPI. The remainder of the ROS are negative.   Physical Exam:   BP 145/71  Pulse 68  Temp 97.7 F (36.5 C)  Ht 1.6 m (5\' 3" )  Wt 77.1 kg (170 lb)  BMI 30.11 kg/m   General: Healthy and alert, in no distress, breathing easily. Normal affect. In a pleasant mood. Head: Normocephalic, atraumatic. No masses, or scars. Eyes: Pupils are equal, and reactive to light. Vision is grossly intact. No spontaneous or gaze nystagmus. Ears: Ear canals are clear. Tympanic membranes are intact, with normal landmarks and the middle ears are clear and healthy. Hearing: Grossly normal. Nose: Nasal cavities are clear with healthy mucosa,  no polyps or exudate. Airways are patent. Face: There is a firm anterior maxillary mass that extends from the upper border of the alveolar bone all the way to the lower border of the infraorbital rim., facial nerve function is symmetric. Oral Cavity: The masses palpable submucosal under the left upper lip. No mucosal abnormalities are noted. Tongue with normal mobility. Dentition appears healthy. Oropharynx: Tonsils are symmetric. There are no mucosal masses identified. Tongue base appears normal and healthy. Larynx/Hypopharynx: deferred Chest: Deferred Neck: No palpable masses, no cervical adenopathy, no thyroid nodules or enlargement. Neuro: Cranial nerves II-XII with normal function. Balance: Normal gate. Other findings: none.  Independent Review of Additional Tests or Records:   Procedures:  none  Impression & Plans:  Anterior maxillary mass of unknown determination. I would like to repeat a biopsy using a Tru-Cut type procedure and will send this for cytopathology as well as flow cytometry. We will try to schedule this as soon as possible at the outpatient center.

## 2020-05-23 ENCOUNTER — Ambulatory Visit: Payer: Medicare Other | Admitting: Cardiology

## 2020-05-23 ENCOUNTER — Encounter (HOSPITAL_COMMUNITY): Payer: Self-pay | Admitting: Otolaryngology

## 2020-05-23 DIAGNOSIS — Z7901 Long term (current) use of anticoagulants: Secondary | ICD-10-CM | POA: Diagnosis not present

## 2020-05-23 DIAGNOSIS — Z79899 Other long term (current) drug therapy: Secondary | ICD-10-CM | POA: Diagnosis not present

## 2020-05-23 DIAGNOSIS — D479 Neoplasm of uncertain behavior of lymphoid, hematopoietic and related tissue, unspecified: Secondary | ICD-10-CM | POA: Diagnosis not present

## 2020-05-23 DIAGNOSIS — I10 Essential (primary) hypertension: Secondary | ICD-10-CM | POA: Diagnosis not present

## 2020-05-23 LAB — GLUCOSE, CAPILLARY: Glucose-Capillary: 132 mg/dL — ABNORMAL HIGH (ref 70–99)

## 2020-05-23 MED ORDER — CLINDAMYCIN HCL 300 MG PO CAPS: 300.0000 mg | ORAL_CAPSULE | Freq: Three times a day (TID) | ORAL | 0 refills | Status: DC

## 2020-05-23 NOTE — Discharge Summary (Signed)
Physician Discharge Summary  Patient ID: Haley Wilcox MRN: [id number] DOB/AGE: Nov 03, 1931 87 y.o.  Admit date: 05/22/2020 Discharge date: 05/23/2020  Admission Diagnoses: Maxillary tumor  Discharge Diagnoses:  Active Problems:   Facial mass   Discharged Condition: good  Hospital Course: No complications  Consults: none  Significant Diagnostic Studies: none  Treatments: surgery: Resection of left maxillary tumor  Discharge Exam: Blood pressure (!) 144/72, pulse 69, temperature 98.2 F (36.8 C), temperature source Oral, resp. rate 15, height 5\' 3"  (1.6 m), weight 77.1 kg, SpO2 98 %. PHYSICAL EXAM: She is awake and alert.  There is moderate swelling of the left side of the face but no ecchymosis or drainage.  Disposition: Discharge disposition: 01-Home or Self Care       Discharge Instructions    Diet - low sodium heart healthy   Complete by: As directed    Discharge wound care:   Complete by: As directed    Apply bacitracin ointment to the facial incisions twice daily.  You may brush your teeth be very gentle with the left upper teeth.   Increase activity slowly   Complete by: As directed      Allergies as of 05/23/2020      Reactions   Fluoride Preparations Swelling   "Lips swell" when brushing teeth with toothpaste that has flouride    Ivp Dye [iodinated Diagnostic Agents] Swelling   Swelling of lips and tongue- per pt 01/09/2020      Medication List    TAKE these medications   amLODipine 10 MG tablet Commonly known as: NORVASC Take 0.5 tablets (5 mg total) by mouth daily. What changed: how much to take   apixaban 5 MG Tabs tablet Commonly known as: ELIQUIS Take 1 tablet (5 mg total) by mouth 2 (two) times daily.   carvedilol 12.5 MG tablet Commonly known as: COREG Take 12.5 mg by mouth 2 (two) times daily.   clindamycin 300 MG capsule Commonly known as: Cleocin Take 1 capsule (300 mg total) by mouth 3 (three) times daily.   doxazosin 2 MG  tablet Commonly known as: CARDURA Take 2 mg by mouth every evening.   enoxaparin 80 MG/0.8ML injection Commonly known as: LOVENOX Inject 0.8 mLs (80 mg total) into the skin every 12 (twelve) hours.   levothyroxine 25 MCG tablet Commonly known as: SYNTHROID Take 25 mcg by mouth daily before breakfast.   losartan 100 MG tablet Commonly known as: COZAAR Take 1 tablet (100 mg total) by mouth daily.   metFORMIN 500 MG tablet Commonly known as: GLUCOPHAGE Take 500 mg by mouth 2 (two) times daily with a meal.   MULTIVITAMIN ADULT PO Take 1 Scoop by mouth daily.   omeprazole 20 MG capsule Commonly known as: PRILOSEC TAKE 1 CAPSULE DAILY AS NEEDED (HEART BURN) What changed:   how much to take  how to take this  when to take this  reasons to take this  additional instructions   polyethylene glycol powder 17 GM/SCOOP powder Commonly known as: GLYCOLAX/MIRALAX Mix 17gm in 4oz of water.  Start with 2 doses per day, but may take up to 6 doses per day.  Titrate number of doses to allow 2-3 soft bowel movements daily. What changed:   how much to take  how to take this  when to take this  reasons to take this  additional instructions   rosuvastatin 5 MG tablet Commonly known as: CRESTOR Take 5 mg by mouth daily.   spironolactone 25 MG tablet  Commonly known as: ALDACTONE TAKE 1 TABLET DAILY            Discharge Care Instructions  (From admission, onward)         Start     Ordered   05/23/20 0000  Discharge wound care:       Comments: Apply bacitracin ointment to the facial incisions twice daily.  You may brush your teeth be very gentle with the left upper teeth.   05/23/20 9244          Follow-up Information    Izora Gala, MD Follow up on 05/24/2020.   Specialty: Otolaryngology Why: Come to the office at 1030 Friday morning. Contact information: [address] [place] [address] [phone]               Signed: Izora Gala 05/23/2020, 9:04 AM

## 2020-05-23 NOTE — Progress Notes (Signed)
Patient given discharge instructions and stated understanding. 

## 2020-05-23 NOTE — Progress Notes (Signed)
Haley Wilcox to be D/C'd  per MD order. Discussed with the patient and all questions fully answered.  VSS, Skin clean, dry and intact without evidence of skin break down, no evidence of skin tears noted.  IV catheter discontinued intact. Site without signs and symptoms of complications. Dressing and pressure applied.  An After Visit Summary was printed and given to the patient. Patient received prescription.  D/c education completed with patient/family including follow up instructions, medication list, d/c activities limitations if indicated, with other d/c instructions as indicated by MD - patient able to verbalize understanding, all questions fully answered.   Patient instructed to return to ED, call 911, or call MD for any changes in condition.   Patient to be escorted via [place], and D/C home via private auto.

## 2020-05-23 NOTE — Discharge Instructions (Signed)
Resume blood thinner tomorrow (Friday).

## 2020-05-23 NOTE — Plan of Care (Signed)
  Problem: Education: Goal: Knowledge of General Education information will improve Description: Including pain rating scale, medication(s)/side effects and non-pharmacologic comfort measures 05/23/2020 0941 by Lurline Idol, RN Outcome: Adequate for Discharge 05/23/2020 0941 by Lurline Idol, RN Outcome: Adequate for Discharge   Problem: Health Behavior/Discharge Planning: Goal: Ability to manage health-related needs will improve 05/23/2020 0941 by Lurline Idol, RN Outcome: Adequate for Discharge 05/23/2020 0941 by Lurline Idol, RN Outcome: Adequate for Discharge   Problem: Clinical Measurements: Goal: Ability to maintain clinical measurements within normal limits will improve 05/23/2020 0941 by Lurline Idol, RN Outcome: Adequate for Discharge 05/23/2020 0941 by Lurline Idol, RN Outcome: Adequate for Discharge Goal: Will remain free from infection 05/23/2020 0941 by Lurline Idol, RN Outcome: Adequate for Discharge 05/23/2020 0941 by Lurline Idol, RN Outcome: Adequate for Discharge Goal: Diagnostic test results will improve 05/23/2020 0941 by Lurline Idol, RN Outcome: Adequate for Discharge 05/23/2020 0941 by Lurline Idol, RN Outcome: Adequate for Discharge Goal: Respiratory complications will improve 05/23/2020 0941 by Lurline Idol, RN Outcome: Adequate for Discharge 05/23/2020 0941 by Lurline Idol, RN Outcome: Adequate for Discharge Goal: Cardiovascular complication will be avoided 05/23/2020 0941 by Lurline Idol, RN Outcome: Adequate for Discharge 05/23/2020 0941 by Lurline Idol, RN Outcome: Adequate for Discharge   Problem: Activity: Goal: Risk for activity intolerance will decrease 05/23/2020 0941 by Lurline Idol, RN Outcome: Adequate for Discharge 05/23/2020 0941 by Lurline Idol, RN Outcome: Adequate for Discharge   Problem: Nutrition: Goal: Adequate nutrition will be maintained 05/23/2020 0941 by Lurline Idol, RN Outcome: Adequate for Discharge 05/23/2020 0941  by Lurline Idol, RN Outcome: Adequate for Discharge   Problem: Coping: Goal: Level of anxiety will decrease 05/23/2020 0941 by Lurline Idol, RN Outcome: Adequate for Discharge 05/23/2020 0941 by Lurline Idol, RN Outcome: Adequate for Discharge   Problem: Elimination: Goal: Will not experience complications related to bowel motility 05/23/2020 0941 by Lurline Idol, RN Outcome: Adequate for Discharge 05/23/2020 0941 by Lurline Idol, RN Outcome: Adequate for Discharge Goal: Will not experience complications related to urinary retention 05/23/2020 0941 by Lurline Idol, RN Outcome: Adequate for Discharge 05/23/2020 0941 by Lurline Idol, RN Outcome: Adequate for Discharge   Problem: Pain Managment: Goal: General experience of comfort will improve 05/23/2020 0941 by Lurline Idol, RN Outcome: Adequate for Discharge 05/23/2020 0941 by Lurline Idol, RN Outcome: Adequate for Discharge   Problem: Safety: Goal: Ability to remain free from injury will improve 05/23/2020 0941 by Lurline Idol, RN Outcome: Adequate for Discharge 05/23/2020 0941 by Lurline Idol, RN Outcome: Adequate for Discharge   Problem: Skin Integrity: Goal: Risk for impaired skin integrity will decrease 05/23/2020 0941 by Lurline Idol, RN Outcome: Adequate for Discharge 05/23/2020 0941 by Lurline Idol, RN Outcome: Adequate for Discharge

## 2020-05-24 ENCOUNTER — Other Ambulatory Visit: Payer: Self-pay

## 2020-05-24 DIAGNOSIS — I10 Essential (primary) hypertension: Secondary | ICD-10-CM

## 2020-05-24 MED ORDER — AMLODIPINE BESYLATE 10 MG PO TABS: 10.0000 mg | ORAL_TABLET | Freq: Every day | ORAL | 1 refills | Status: DC

## 2020-05-29 LAB — SURGICAL PATHOLOGY

## 2020-06-03 ENCOUNTER — Telehealth: Payer: Self-pay | Admitting: Hematology and Oncology

## 2020-06-03 NOTE — Telephone Encounter (Signed)
Scheduled per 03/14 scheduling message, called patient regarding 03/16 appointment. Patient is notified.

## 2020-06-05 ENCOUNTER — Other Ambulatory Visit: Payer: Self-pay

## 2020-06-05 ENCOUNTER — Inpatient Hospital Stay: Payer: Medicare Other | Attending: Hematology and Oncology

## 2020-06-05 ENCOUNTER — Other Ambulatory Visit: Payer: Self-pay | Admitting: Hematology and Oncology

## 2020-06-05 ENCOUNTER — Inpatient Hospital Stay (HOSPITAL_BASED_OUTPATIENT_CLINIC_OR_DEPARTMENT_OTHER): Payer: Medicare Other | Admitting: Hematology and Oncology

## 2020-06-05 VITALS — BP 123/64 | HR 57 | Temp 96.4°F | Resp 17 | Ht 63.0 in | Wt 170.4 lb

## 2020-06-05 DIAGNOSIS — C8590 Non-Hodgkin lymphoma, unspecified, unspecified site: Secondary | ICD-10-CM | POA: Diagnosis not present

## 2020-06-05 DIAGNOSIS — D479 Neoplasm of uncertain behavior of lymphoid, hematopoietic and related tissue, unspecified: Secondary | ICD-10-CM | POA: Diagnosis not present

## 2020-06-05 LAB — CMP (CANCER CENTER ONLY)
ALT: 13 U/L (ref 0–44)
AST: 17 U/L (ref 15–41)
Albumin: 3.9 g/dL (ref 3.5–5.0)
Alkaline Phosphatase: 66 U/L (ref 38–126)
Anion gap: 10 (ref 5–15)
BUN: 27 mg/dL — ABNORMAL HIGH (ref 8–23)
CO2: 25 mmol/L (ref 22–32)
Calcium: 10.2 mg/dL (ref 8.9–10.3)
Chloride: 105 mmol/L (ref 98–111)
Creatinine: 0.91 mg/dL (ref 0.44–1.00)
GFR, Estimated: >60 mL/min (ref >60)
Glucose, Bld: 99 mg/dL (ref 70–99)
Potassium: 4.8 mmol/L (ref 3.5–5.1)
Sodium: 140 mmol/L (ref 135–145)
Total Bilirubin: 0.4 mg/dL (ref 0.3–1.2)
Total Protein: 7.4 g/dL (ref 6.5–8.1)

## 2020-06-05 LAB — CBC WITH DIFFERENTIAL (CANCER CENTER ONLY)
Abs Immature Granulocytes: 0.04 10*3/uL (ref 0.00–0.07)
Basophils Absolute: 0 10*3/uL (ref 0.0–0.1)
Basophils Relative: 1 %
Eosinophils Absolute: 0.2 10*3/uL (ref 0.0–0.5)
Eosinophils Relative: 2 %
HCT: 33.5 % — ABNORMAL LOW (ref 36.0–46.0)
Hemoglobin: 10.9 g/dL — ABNORMAL LOW (ref 12.0–15.0)
Immature Granulocytes: 1 %
Lymphocytes Relative: 18 %
Lymphs Abs: 1.5 10*3/uL (ref 0.7–4.0)
MCH: 27.9 pg (ref 26.0–34.0)
MCHC: 32.5 g/dL (ref 30.0–36.0)
MCV: 85.7 fL (ref 80.0–100.0)
Monocytes Absolute: 0.6 10*3/uL (ref 0.1–1.0)
Monocytes Relative: 7 %
Neutro Abs: 6.1 10*3/uL (ref 1.7–7.7)
Neutrophils Relative %: 71 %
Platelet Count: 242 10*3/uL (ref 150–400)
RBC: 3.91 MIL/uL (ref 3.87–5.11)
RDW: 15.7 % — ABNORMAL HIGH (ref 11.5–15.5)
WBC Count: 8.4 10*3/uL (ref 4.0–10.5)
nRBC: 0 % (ref 0.0–0.2)

## 2020-06-05 LAB — LACTATE DEHYDROGENASE: LDH: 161 U/L (ref 98–192)

## 2020-06-05 NOTE — Progress Notes (Signed)
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 No evidence of a hematological malignancy noted.  3) 01/25/2020: establish care with Dr. Lorenso Courier   Interval History:  Haley Wilcox 87 y.o. female with medical history significant for an atypical lymphoproliferative mass who  presents for a follow up visit. The patient's last visit was on 01/24/2021. In the interim since the last visit she had a sublabial approach resection of the left maxillary mass with pathology results once again showing an atypical lymphocyte collection.  Due to concern for possible hematological malignancy she was referred to hematology.  On exam today Haley Wilcox is accompanied by her daughter.  She reports that her energy level has been low but she is recovering well from the surgery.  She has had no bleeding, bleeding bruising, or drainage from the incision site.  She is not having any fevers, chills, sweats, nausea, vomiting or diarrhea.  She reports that she is not eating as well because of the stitch in her lower lip which is still recovering.  She does have some swelling in her face.  Overall she notes she is at her baseline level of health with no other major changes other than the surgery.  A full 10 point ROS is listed below.  MEDICAL HISTORY:  Past Medical History:  Diagnosis Date  . A-fib ([place])   . Constipation   . Constipation   . Diabetes mellitus   . GERD (gastroesophageal reflux disease)   . Gout   . Gout   . Heartburn   . Hyperlipidemia   .  Hypertension   . Hypothyroidism   . Pneumonia   . Stroke [facility]) 06/07/2018    SURGICAL HISTORY: Past Surgical History:  Procedure Laterality Date  . ABDOMINAL HYSTERECTOMY    . d &c    . DILATION AND CURETTAGE OF UTERUS    . EXCISION MASS HEAD Left 05/22/2020   Procedure: Sublabial Approach Resection of Left Maxillary Mass;  Surgeon: Izora Gala, MD;  Location: [facility];  Service: ENT;  Laterality: Left;  . EYE SURGERY     bilateral cataract  . MASS EXCISION Left 02/28/2020   Procedure: Biopsy of Facial Mass;  Surgeon: Izora Gala, MD;  Location: [place];  Service: ENT;  Laterality: Left;  . TUBAL LIGATION      SOCIAL HISTORY: Social History   Socioeconomic History  . Marital status: Widowed    Spouse name: Not on file  . Number of children: 2  . Years of education: Not on file  . Highest education level: Not on file  Occupational History  . Occupation: Retired Therapist, sports  Tobacco Use  . Smoking status: Never Smoker  . Smokeless tobacco: Never Used  Vaping Use  . Vaping Use: Never used  Substance and Sexual Activity  . Alcohol use: No    Alcohol/week: 0.0 standard drinks  . Drug use: No  . Sexual activity: Yes  Other Topics Concern  . Not on file  Social History Narrative  . Not on file   Social Determinants of Health   Financial Resource Strain: Not on file  Food Insecurity: Not on file  Transportation Needs: Not on file  Physical Activity: Not on file  Stress: Not on file  Social Connections: Not on file  Intimate Partner Violence: Not on file    FAMILY HISTORY: Family History  Problem Relation Age of Onset  . Hypertension Mother   . Heart disease Mother   . Hypertension Sister   . Heart attack Sister   . Hypertension Brother   . Heart attack Brother     ALLERGIES:  is allergic to other, fluoride preparations, and iodinated diagnostic agents.  MEDICATIONS:  Current Outpatient Medications  Medication Sig Dispense Refill  . amLODipine  (NORVASC) 10 MG tablet Take 1 tablet (10 mg total) by mouth daily. 90 tablet 1  . apixaban (ELIQUIS) 5 MG TABS tablet Take 1 tablet (5 mg total) by mouth 2 (two) times daily. 60 tablet 2  . carvedilol (COREG) 12.5 MG tablet Take 12.5 mg by mouth 2 (two) times daily.    Marland Kitchen doxazosin (CARDURA) 2 MG tablet Take 2 mg by mouth every evening.     Marland Kitchen levothyroxine (SYNTHROID, LEVOTHROID) 25 MCG tablet Take 25 mcg by mouth daily before breakfast.    . losartan (COZAAR) 100 MG tablet Take 1 tablet (100 mg total) by mouth daily. 60 tablet 3  . metFORMIN (GLUCOPHAGE) 500 MG tablet Take 500 mg by mouth 2 (two) times daily with a meal.    . Multiple Vitamin (MULTIVITAMIN ADULT PO) Take 1 Scoop by mouth daily.    Marland Kitchen omeprazole (PRILOSEC) 20 MG capsule TAKE 1 CAPSULE DAILY AS NEEDED (HEART BURN) (Patient taking differently: Take 20 mg by mouth daily as needed (heart burn).) 30 capsule 0  . polyethylene glycol powder (GLYCOLAX/MIRALAX) powder Mix 17gm in 4oz of water.  Start with 2 doses per day, but may take up to 6 doses per day.  Titrate number of doses to allow 2-3 soft bowel movements daily. (Patient taking differently: Take 17 g by mouth daily as needed for moderate constipation.) 850 g 1  . rosuvastatin (CRESTOR) 5 MG tablet Take 5 mg by mouth daily.    Marland Kitchen spironolactone (ALDACTONE) 25 MG tablet TAKE 1 TABLET DAILY (Patient taking differently: Take 25 mg by mouth daily.) 90 tablet 3   No current facility-administered medications for this visit.    REVIEW OF SYSTEMS:   Constitutional: ( - ) fevers, ( - )  chills , ( - ) night sweats Eyes: ( - ) blurriness of vision, ( - ) double vision, ( - ) watery eyes Ears, nose, mouth, throat, and face: ( - ) mucositis, ( - ) sore throat Respiratory: ( - ) cough, ( - ) dyspnea, ( - ) wheezes Cardiovascular: ( - ) palpitation, ( - ) chest discomfort, ( - ) lower extremity swelling Gastrointestinal:  ( - ) nausea, ( - ) heartburn, ( - ) change in bowel habits Skin: ( - )  abnormal skin rashes Lymphatics: ( - ) new lymphadenopathy, ( - ) easy bruising Neurological: ( - ) numbness, ( - ) tingling, ( - ) new weaknesses Behavioral/Psych: ( - ) mood change, ( - ) new changes  All other systems were reviewed with the patient and are negative.  PHYSICAL EXAMINATION:  Vitals:   06/05/20 1303  BP: 123/64  Pulse: (!) 57  Resp: 17  Temp: (!) 96.4 F (35.8 C)  SpO2: 99%   Filed Weights   06/05/20 1303  Weight: 170 lb 6.4 oz (77.3 kg)    GENERAL: well appearing elderly African American  female alert, no distress and comfortable SKIN: skin color, texture, turgor are normal, no rashes or significant lesions EYES: conjunctiva are pink and non-injected, sclera clear LUNGS: clear to auscultation and percussion with normal breathing effort HEART: regular rate & rhythm and no murmurs and no lower extremity edema Musculoskeletal: no cyanosis of digits and no clubbing  PSYCH: alert & oriented x 3, fluent speech NEURO: no focal motor/sensory deficits  LABORATORY DATA:  I have reviewed the data as listed CBC Latest Ref Rng & Units 06/05/2020 05/22/2020 01/25/2020  WBC 4.0 - 10.5 K/uL 8.4 7.6 8.6  Hemoglobin 12.0 - 15.0 g/dL 10.9(L) 11.4(L) 12.0  Hematocrit 36.0 - 46.0 % 33.5(L) 35.7(L) 37.4  Platelets 150 - 400 K/uL 242 225 223    CMP Latest Ref Rng & Units 06/05/2020 05/22/2020 01/25/2020  Glucose 70 - 99 mg/dL 99 132(H) 100(H)  BUN 8 - 23 mg/dL 27(H) 18 31(H)  Creatinine 0.44 - 1.00 mg/dL 0.91 0.90 0.97  Sodium 135 - 145 mmol/L 140 139 140  Potassium 3.5 - 5.1 mmol/L 4.8 4.1 4.8  Chloride 98 - 111 mmol/L 105 105 104  CO2 22 - 32 mmol/L 25 26 26   Calcium 8.9 - 10.3 mg/dL 10.2 9.9 10.4(H)  Total Protein 6.5 - 8.1 g/dL 7.4 - 7.3  Total Bilirubin 0.3 - 1.2 mg/dL 0.4 - 0.3  Alkaline Phos 38 - 126 U/L 66 - 62  AST 15 - 41 U/L 17 - 13(L)  ALT 0 - 44 U/L 13 - 9     RADIOGRAPHIC STUDIES: No results found.  ASSESSMENT & PLAN Haley Wilcox 87 y.o. female with  medical history significant for an atypical lymphoproliferative mass who  presents for a follow up visit.   After review the labs, the records, discussion with the patient the diagnosis remains the same and this is an atypical lymphocyte collection.  It is uncertain whether or not this represents a hematological malignancy as there is no monoclonal component.  The tissue is currently undergoing genetic testing which will help [place] better determine if this truly represents a hematological malignancy.  In the event that there is no evidence of hematologic malignancy I would recommend continued follow-up with ENT.  If this were to be determined to be a lymphoma we would need to do complete staging scans to determine the best treatment course.  We briefly discussed today that chemotherapy may be an option if lymphoma was found, however given the patient's advanced age she may decline that kind of treatment.  We will wait for the final results of pathology in order to make further plans moving forward.  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 Appreciate the assistance of ENT --awaiting final genetic studies for this mass.  --no need for routine f/u in our clinic unless the tissue diagnosis was to change or if this was thought to be a primarily hematological disorder.   --RTC pending final pathology results.   No orders of the defined types were placed in this encounter.   All questions were answered. The patient knows to call the clinic with any problems, questions or concerns.  A total of more than 30 minutes were spent on this encounter and over half of that time was spent on counseling and coordination of care as outlined above.   Ledell Peoples, MD Department of Hematology/Oncology [facility]  [facility] Phone: [phone] Pager: [phone] Email: [email or web address]  06/05/2020 3:24 PM

## 2020-06-06 ENCOUNTER — Telehealth: Payer: Self-pay | Admitting: Hematology and Oncology

## 2020-06-06 NOTE — Telephone Encounter (Signed)
Scheduled per los. Called and spoke with patient. Confirmed appt 

## 2020-06-10 DIAGNOSIS — H903 Sensorineural hearing loss, bilateral: Secondary | ICD-10-CM | POA: Diagnosis not present

## 2020-06-11 ENCOUNTER — Encounter (HOSPITAL_COMMUNITY): Payer: Self-pay | Admitting: Otolaryngology

## 2020-06-17 DIAGNOSIS — E7800 Pure hypercholesterolemia, unspecified: Secondary | ICD-10-CM | POA: Diagnosis not present

## 2020-06-17 DIAGNOSIS — E039 Hypothyroidism, unspecified: Secondary | ICD-10-CM | POA: Diagnosis not present

## 2020-06-17 DIAGNOSIS — I639 Cerebral infarction, unspecified: Secondary | ICD-10-CM | POA: Diagnosis not present

## 2020-06-17 DIAGNOSIS — I10 Essential (primary) hypertension: Secondary | ICD-10-CM | POA: Diagnosis not present

## 2020-06-17 DIAGNOSIS — I2510 Atherosclerotic heart disease of native coronary artery without angina pectoris: Secondary | ICD-10-CM | POA: Diagnosis not present

## 2020-06-17 DIAGNOSIS — E119 Type 2 diabetes mellitus without complications: Secondary | ICD-10-CM | POA: Diagnosis not present

## 2020-06-17 DIAGNOSIS — E1129 Type 2 diabetes mellitus with other diabetic kidney complication: Secondary | ICD-10-CM | POA: Diagnosis not present

## 2020-06-17 DIAGNOSIS — I480 Paroxysmal atrial fibrillation: Secondary | ICD-10-CM | POA: Diagnosis not present

## 2020-06-17 DIAGNOSIS — E1169 Type 2 diabetes mellitus with other specified complication: Secondary | ICD-10-CM | POA: Diagnosis not present

## 2020-06-19 NOTE — Progress Notes (Signed)
Subjective:   Haley Wilcox, female    DOB: Jan 17, 1932, 87 y.o.   MRN: [id number]   Chief complaint:  Atrial fibrillation   HPI   87 y.o. African-American female with hypertension, type 2 diabetes mellitus, hyperlipidemia, iron deficiency anemia, recent Lt ACA stroke 05/2018, persistent Afib, mod b/l carotid stenoses.  Patient recently underwent surgery for a left facial mass, pathology still pending.  She is still recovering from surgery and healing well.  Blood pressure remains well controlled.  She denies chest pain, shortness of breath, palpitations, leg edema, orthopnea, PND, TIA/syncope.  Current Outpatient Medications on File Prior to Visit  Medication Sig Dispense Refill  . amLODipine (NORVASC) 10 MG tablet Take 1 tablet (10 mg total) by mouth daily. 90 tablet 1  . apixaban (ELIQUIS) 5 MG TABS tablet Take 1 tablet (5 mg total) by mouth 2 (two) times daily. 60 tablet 2  . carvedilol (COREG) 12.5 MG tablet Take 12.5 mg by mouth 2 (two) times daily.    . colchicine 0.6 MG tablet Take 0.6 mg by mouth as needed.    . doxazosin (CARDURA) 2 MG tablet Take 2 mg by mouth every evening.     Marland Kitchen levothyroxine (SYNTHROID, LEVOTHROID) 25 MCG tablet Take 25 mcg by mouth daily before breakfast.    . losartan (COZAAR) 100 MG tablet Take 1 tablet (100 mg total) by mouth daily. 60 tablet 3  . metFORMIN (GLUCOPHAGE) 500 MG tablet Take 500 mg by mouth 2 (two) times daily with a meal.    . Multiple Vitamin (MULTIVITAMIN ADULT PO) Take 1 Scoop by mouth daily.    Marland Kitchen omeprazole (PRILOSEC) 20 MG capsule TAKE 1 CAPSULE DAILY AS NEEDED (HEART BURN) (Patient taking differently: Take 20 mg by mouth daily as needed (heart burn).) 30 capsule 0  . polyethylene glycol powder (GLYCOLAX/MIRALAX) powder Mix 17gm in 4oz of water.  Start with 2 doses per day, but may take up to 6 doses per day.  Titrate number of doses to allow 2-3 soft bowel movements daily. (Patient taking differently: Take 17 g by mouth daily.)  850 g 1  . rosuvastatin (CRESTOR) 5 MG tablet Take 5 mg by mouth daily.    Marland Kitchen spironolactone (ALDACTONE) 25 MG tablet TAKE 1 TABLET DAILY (Patient taking differently: Take 25 mg by mouth daily.) 90 tablet 3   No current facility-administered medications on file prior to visit.    Cardiovascular studies:  EKG 06/20/2020: Atrial fibrillation 61 bpm Nonspecific T-abnormality  Carotid artery duplex 01/31/2020:  Stenosis in the right internal carotid artery (50-69%), upper end of the  spectrum.  Stenosis in the left internal carotid artery (50-69%), upper end of the  spectrum. Stenosis in the left external carotid artery (<50%).  Antegrade right vertebral artery flow. Antegrade left vertebral artery  flow.  Follow up in six months is appropriate if clinically indicated. Compared  to 08/14/2019, right ICA stenosis severity slightly improved from >70%.   Echocardiogram 06/08/2018:  1. The left ventricle has normal systolic function with an ejection fraction of 60-65%. The cavity size was normal. There is mildly increased left ventricular wall thickness. Left ventricular diastolic function could not be evaluated secondary to atrial  fibrillation.  2. The right ventricle has normal systolic function. The cavity was normal. There is no increase in right ventricular wall thickness.  3. Left atrial size was severely dilated.  4. Right atrial size was mildly dilated.  5. The aortic root and ascending aorta are normal in size  and structure.  6. The interatrial septum was not assessed.   Recent labs: 06/05/2020: Glucose 99, BUN/Cr 27/0.91. EGFR >60. Na/K 140/4.8. Rest of the CMP normal H/H 10.9/33.5. MCV 85. Platelets 242 HbA1C 7.1%  11/14/2019: Glucose 136, BUN/Cr 20/0.93. EGFR 56. Na/K 142/4.7. Calcium 10.5. Rest of the CMP normal H/H 12.8/40.7. MCV 86. Platelets 213 HbA1C 7.3% Chol 129, TG 100, HDL 51, LDL 59 TSH 3.0 normal  08/07/2019: Glucose 169, BUN/Cr 20/0.9. eGFR 54. HbA1C  7.5% Chol 123, TG 84, HDL 49, LDL 155 TSH 3.3 normal   Review of Systems  Cardiovascular: Negative for chest pain, dyspnea on exertion, leg swelling, palpitations and syncope.        Body mass index is 30.29 kg/m.   Vitals:   06/20/20 0937  BP: 137/68  Pulse: 60  Temp: (!) 97.4 F (36.3 C)  SpO2: 99%    Objective:    Physical Exam Vitals and nursing note reviewed.  Constitutional:      General: She is not in acute distress. HENT:     Mouth/Throat:     Comments: Minimal swelling left face.  Healing well post surgery. Neck:     Vascular: No JVD.  Cardiovascular:     Rate and Rhythm: Normal rate. Rhythm irregular.     Pulses: Intact distal pulses.     Heart sounds: No murmur heard.   Pulmonary:     Effort: Pulmonary effort is normal.     Breath sounds: Normal breath sounds. No wheezing or rales.         Assessment & Recommendations:   87 y.o. African-American female with hypertension, type 2 diabetes mellitus, hyperlipidemia, iron deficiency anemia, recent Lt ACA stroke 05/2018, persistent Afib, mod b/l carotid stenoses, s/p left facial surgery (05/2020)   Fatigue: Multifactorial, stable. I do not think she warrants cardioversion in absence of any other symptoms.   Persistent atrial fibrillation Reasonable to continue rate control strategy. Continue coreg 12.5 mg bid.  CHA2DS2VASc score 7, annual stroke risk 11% Continue eliquis 5 mg bid  Hyperlipidemia: LDL down to 59 on rosuvastatin 10 mg Lipid panel in 6 months  Hypertension Well controlled.   H/o stroke Stroke in [place] ACA. Minimal neurodeficit.  Continue Crestor to 10 mg. LDL 59. Not on Aspirin due to ongoing use of eliquis given Afib. Continue f/u w/Neurology.  Carotid stenosis, asymptomatic, bilateral: Moderate. Repeat carotid US in 07/2020  Type 2 DM: Follow up with PCP.  F/u in 6 months  Jetaime Pinnix Esther Hardy, MD [facility] Pager: [phone] Office:  [phone] If no answer Cell [phone]

## 2020-06-20 ENCOUNTER — Other Ambulatory Visit: Payer: Self-pay

## 2020-06-20 ENCOUNTER — Encounter: Payer: Self-pay | Admitting: Cardiology

## 2020-06-20 ENCOUNTER — Ambulatory Visit: Payer: Medicare Other | Admitting: Cardiology

## 2020-06-20 VITALS — BP 137/68 | HR 60 | Temp 97.4°F | Ht 63.0 in | Wt 171.0 lb

## 2020-06-20 DIAGNOSIS — E119 Type 2 diabetes mellitus without complications: Secondary | ICD-10-CM | POA: Diagnosis not present

## 2020-06-20 DIAGNOSIS — E782 Mixed hyperlipidemia: Secondary | ICD-10-CM | POA: Diagnosis not present

## 2020-06-20 DIAGNOSIS — I6523 Occlusion and stenosis of bilateral carotid arteries: Secondary | ICD-10-CM | POA: Diagnosis not present

## 2020-06-20 DIAGNOSIS — I10 Essential (primary) hypertension: Secondary | ICD-10-CM

## 2020-06-20 DIAGNOSIS — Z8673 Personal history of transient ischemic attack (TIA), and cerebral infarction without residual deficits: Secondary | ICD-10-CM

## 2020-06-20 DIAGNOSIS — I4819 Other persistent atrial fibrillation: Secondary | ICD-10-CM | POA: Diagnosis not present

## 2020-06-27 ENCOUNTER — Other Ambulatory Visit: Payer: Self-pay | Admitting: Hematology and Oncology

## 2020-06-27 ENCOUNTER — Inpatient Hospital Stay: Payer: Medicare Other

## 2020-06-27 ENCOUNTER — Telehealth: Payer: Self-pay | Admitting: *Deleted

## 2020-06-27 ENCOUNTER — Inpatient Hospital Stay: Payer: Medicare Other | Admitting: Hematology and Oncology

## 2020-06-27 DIAGNOSIS — D479 Neoplasm of uncertain behavior of lymphoid, hematopoietic and related tissue, unspecified: Secondary | ICD-10-CM

## 2020-06-27 NOTE — Telephone Encounter (Signed)
[name] regarding her appts this afternoon. Per Dr. Lorenso Courier,  Her pathology report has come back negative for cancer. He does not need to see her at this time.  Pt very happy about this and she is fine about not coming today.  No future appts needed at this time.

## 2020-07-01 ENCOUNTER — Other Ambulatory Visit: Payer: Self-pay | Admitting: Cardiology

## 2020-07-01 DIAGNOSIS — E782 Mixed hyperlipidemia: Secondary | ICD-10-CM

## 2020-07-08 DIAGNOSIS — H40023 Open angle with borderline findings, high risk, bilateral: Secondary | ICD-10-CM | POA: Diagnosis not present

## 2020-07-16 DIAGNOSIS — E119 Type 2 diabetes mellitus without complications: Secondary | ICD-10-CM | POA: Diagnosis not present

## 2020-07-16 DIAGNOSIS — I639 Cerebral infarction, unspecified: Secondary | ICD-10-CM | POA: Diagnosis not present

## 2020-07-16 DIAGNOSIS — E1169 Type 2 diabetes mellitus with other specified complication: Secondary | ICD-10-CM | POA: Diagnosis not present

## 2020-07-16 DIAGNOSIS — E1129 Type 2 diabetes mellitus with other diabetic kidney complication: Secondary | ICD-10-CM | POA: Diagnosis not present

## 2020-07-16 DIAGNOSIS — I10 Essential (primary) hypertension: Secondary | ICD-10-CM | POA: Diagnosis not present

## 2020-07-16 DIAGNOSIS — E7800 Pure hypercholesterolemia, unspecified: Secondary | ICD-10-CM | POA: Diagnosis not present

## 2020-07-16 DIAGNOSIS — I480 Paroxysmal atrial fibrillation: Secondary | ICD-10-CM | POA: Diagnosis not present

## 2020-07-16 DIAGNOSIS — I2510 Atherosclerotic heart disease of native coronary artery without angina pectoris: Secondary | ICD-10-CM | POA: Diagnosis not present

## 2020-07-16 DIAGNOSIS — E039 Hypothyroidism, unspecified: Secondary | ICD-10-CM | POA: Diagnosis not present

## 2020-08-06 ENCOUNTER — Other Ambulatory Visit: Payer: Self-pay

## 2020-08-06 ENCOUNTER — Ambulatory Visit: Payer: Medicare Other

## 2020-08-06 DIAGNOSIS — I6523 Occlusion and stenosis of bilateral carotid arteries: Secondary | ICD-10-CM | POA: Diagnosis not present

## 2020-08-07 NOTE — Progress Notes (Signed)
Carotid artery duplex 08/06/2020: Stenosis in the right internal carotid artery (50-69%), upper end of the spectrum. Stenosis in the left internal carotid artery (50-69%), upper end of the spectrum.  Antegrade right vertebral artery flow. Antegrade left vertebral artery flow. Follow up in six months is appropriate if clinically indicated. Compared to 01/31/2020, no significant change.

## 2020-09-04 ENCOUNTER — Telehealth: Payer: Self-pay | Admitting: Adult Health

## 2020-09-04 DIAGNOSIS — I639 Cerebral infarction, unspecified: Secondary | ICD-10-CM | POA: Diagnosis not present

## 2020-09-04 DIAGNOSIS — Z7984 Long term (current) use of oral hypoglycemic drugs: Secondary | ICD-10-CM | POA: Diagnosis not present

## 2020-09-04 DIAGNOSIS — E1169 Type 2 diabetes mellitus with other specified complication: Secondary | ICD-10-CM | POA: Diagnosis not present

## 2020-09-04 DIAGNOSIS — I10 Essential (primary) hypertension: Secondary | ICD-10-CM | POA: Diagnosis not present

## 2020-09-04 DIAGNOSIS — I480 Paroxysmal atrial fibrillation: Secondary | ICD-10-CM | POA: Diagnosis not present

## 2020-09-04 DIAGNOSIS — E039 Hypothyroidism, unspecified: Secondary | ICD-10-CM | POA: Diagnosis not present

## 2020-09-04 DIAGNOSIS — I700 Atherosclerosis of aorta: Secondary | ICD-10-CM | POA: Diagnosis not present

## 2020-09-04 DIAGNOSIS — Z1389 Encounter for screening for other disorder: Secondary | ICD-10-CM | POA: Diagnosis not present

## 2020-09-04 DIAGNOSIS — I6523 Occlusion and stenosis of bilateral carotid arteries: Secondary | ICD-10-CM | POA: Diagnosis not present

## 2020-09-04 DIAGNOSIS — E7800 Pure hypercholesterolemia, unspecified: Secondary | ICD-10-CM | POA: Diagnosis not present

## 2020-09-04 DIAGNOSIS — D6869 Other thrombophilia: Secondary | ICD-10-CM | POA: Diagnosis not present

## 2020-09-04 DIAGNOSIS — I2510 Atherosclerotic heart disease of native coronary artery without angina pectoris: Secondary | ICD-10-CM | POA: Diagnosis not present

## 2020-09-04 DIAGNOSIS — Z0000 Encounter for general adult medical examination without abnormal findings: Secondary | ICD-10-CM | POA: Diagnosis not present

## 2020-09-04 NOTE — Telephone Encounter (Signed)
OV cancelled due to np out.

## 2020-09-05 ENCOUNTER — Ambulatory Visit: Payer: Medicare Other | Admitting: Adult Health

## 2020-10-07 DIAGNOSIS — H40023 Open angle with borderline findings, high risk, bilateral: Secondary | ICD-10-CM | POA: Diagnosis not present

## 2020-10-30 ENCOUNTER — Other Ambulatory Visit: Payer: Self-pay | Admitting: Cardiology

## 2020-10-30 DIAGNOSIS — I10 Essential (primary) hypertension: Secondary | ICD-10-CM

## 2020-10-31 ENCOUNTER — Other Ambulatory Visit: Payer: Self-pay | Admitting: Cardiology

## 2020-10-31 DIAGNOSIS — I10 Essential (primary) hypertension: Secondary | ICD-10-CM

## 2020-11-05 DIAGNOSIS — D479 Neoplasm of uncertain behavior of lymphoid, hematopoietic and related tissue, unspecified: Secondary | ICD-10-CM | POA: Diagnosis not present

## 2020-11-06 ENCOUNTER — Telehealth: Payer: Self-pay | Admitting: *Deleted

## 2020-11-06 NOTE — Telephone Encounter (Signed)
Received call from Dr. Constance Holster regarding Ms. Cangemi. 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 Lorenso Courier about treatment options.  Dr. Constance Holster made aware that Dr. Lorenso Courier is out of the offcie this week and will return on 11/11/20  Please return his call @ [phone]

## 2020-11-14 ENCOUNTER — Telehealth: Payer: Self-pay | Admitting: *Deleted

## 2020-11-14 NOTE — Telephone Encounter (Signed)
Please schedule for next available follow up visit

## 2020-11-14 NOTE — Telephone Encounter (Signed)
Received  call from pt. She states she sees Dr. 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 Shge states that Dr. Constance Holster recommended she see Dr. Dr. Lorenso Courier again to see if he had any treatment options available. Pt states it is growing close to her eye.  Please advise

## 2020-11-15 ENCOUNTER — Telehealth: Payer: Self-pay | Admitting: Hematology and Oncology

## 2020-11-15 NOTE — Telephone Encounter (Signed)
Scheduled appts per 8/25 sch msg. Pt aware.  

## 2020-11-20 ENCOUNTER — Other Ambulatory Visit: Payer: Self-pay

## 2020-11-20 ENCOUNTER — Inpatient Hospital Stay (HOSPITAL_BASED_OUTPATIENT_CLINIC_OR_DEPARTMENT_OTHER): Payer: Medicare Other | Admitting: Hematology and Oncology

## 2020-11-20 ENCOUNTER — Inpatient Hospital Stay: Payer: Medicare Other | Attending: Hematology and Oncology

## 2020-11-20 VITALS — BP 112/68 | HR 70 | Temp 98.0°F | Resp 17 | Wt 171.5 lb

## 2020-11-20 DIAGNOSIS — C8590 Non-Hodgkin lymphoma, unspecified, unspecified site: Secondary | ICD-10-CM | POA: Diagnosis not present

## 2020-11-20 DIAGNOSIS — Z79899 Other long term (current) drug therapy: Secondary | ICD-10-CM | POA: Diagnosis not present

## 2020-11-20 DIAGNOSIS — D479 Neoplasm of uncertain behavior of lymphoid, hematopoietic and related tissue, unspecified: Secondary | ICD-10-CM | POA: Diagnosis not present

## 2020-11-20 LAB — CBC WITH DIFFERENTIAL (CANCER CENTER ONLY)
Abs Immature Granulocytes: 0.03 10*3/uL (ref 0.00–0.07)
Basophils Absolute: 0 10*3/uL (ref 0.0–0.1)
Basophils Relative: 1 %
Eosinophils Absolute: 0.1 10*3/uL (ref 0.0–0.5)
Eosinophils Relative: 2 %
HCT: 35.9 % — ABNORMAL LOW (ref 36.0–46.0)
Hemoglobin: 11.7 g/dL — ABNORMAL LOW (ref 12.0–15.0)
Immature Granulocytes: 0 %
Lymphocytes Relative: 15 %
Lymphs Abs: 1.4 10*3/uL (ref 0.7–4.0)
MCH: 28 pg (ref 26.0–34.0)
MCHC: 32.6 g/dL (ref 30.0–36.0)
MCV: 85.9 fL (ref 80.0–100.0)
Monocytes Absolute: 0.6 10*3/uL (ref 0.1–1.0)
Monocytes Relative: 7 %
Neutro Abs: 6.7 10*3/uL (ref 1.7–7.7)
Neutrophils Relative %: 75 %
Platelet Count: 231 10*3/uL (ref 150–400)
RBC: 4.18 MIL/uL (ref 3.87–5.11)
RDW: 15.3 % (ref 11.5–15.5)
WBC Count: 8.9 10*3/uL (ref 4.0–10.5)
nRBC: 0 % (ref 0.0–0.2)

## 2020-11-20 LAB — CMP (CANCER CENTER ONLY)
ALT: 15 U/L (ref 0–44)
AST: 16 U/L (ref 15–41)
Albumin: 4 g/dL (ref 3.5–5.0)
Alkaline Phosphatase: 75 U/L (ref 38–126)
Anion gap: 9 (ref 5–15)
BUN: 27 mg/dL — ABNORMAL HIGH (ref 8–23)
CO2: 26 mmol/L (ref 22–32)
Calcium: 10.5 mg/dL — ABNORMAL HIGH (ref 8.9–10.3)
Chloride: 104 mmol/L (ref 98–111)
Creatinine: 1 mg/dL (ref 0.44–1.00)
GFR, Estimated: 55 mL/min — ABNORMAL LOW (ref >60)
Glucose, Bld: 134 mg/dL — ABNORMAL HIGH (ref 70–99)
Potassium: 4.7 mmol/L (ref 3.5–5.1)
Sodium: 139 mmol/L (ref 135–145)
Total Bilirubin: 0.4 mg/dL (ref 0.3–1.2)
Total Protein: 7.6 g/dL (ref 6.5–8.1)

## 2020-11-20 LAB — LACTATE DEHYDROGENASE: LDH: 180 U/L (ref 98–192)

## 2020-11-20 NOTE — Progress Notes (Signed)
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 No evidence of a hematological malignancy noted.  3) 01/25/2020: establish care with Dr. Lorenso Courier  4) 05/22/2020: ENT resection of mass, pathology showed an atypical B-cell lymphoproliferation   Interval History:  Haley Wilcox 87 y.o. female with medical history significant for an atypical lymphoproliferative mass who  presents for a follow up visit. The patient's last visit was on 06/05/2020. In the interim since the last visit she has had recurrence of the maxillary mass  She was re-referred for consideration of medical/radiation treatment.   On exam today Haley Wilcox notes that the lesion in her maxillary sinus has slowly begun to grow.  She is concerned because it appears to be approaching her eye.  She notes it is firm under the skin but does not cause any pain at baseline.  She notes that when she pushes hard on it she does feel a "needlelike sensation".  She has that she is not having any issues with fevers, chills, sweats, nausea, vomiting or diarrhea.  Her weight has been stable and she is otherwise not noticed any lymphadenopathy.  A full 10 point ROS is listed below.  MEDICAL HISTORY:  Past Medical History:  Diagnosis Date   A-fib [facility])    Constipation    Constipation    Diabetes mellitus    GERD (gastroesophageal reflux disease)    Gout    Gout    Heartburn    Hyperlipidemia    Hypertension    Hypothyroidism    Pneumonia    Stroke ([place]) 06/07/2018    SURGICAL HISTORY: Past  Surgical History:  Procedure Laterality Date   ABDOMINAL HYSTERECTOMY     d &c     DILATION AND CURETTAGE OF UTERUS     EXCISION MASS HEAD Left 05/22/2020   Procedure: Sublabial Approach Resection of Left Maxillary Mass;  Surgeon: Izora Gala, MD;  Location: [place];  Service: ENT;  Laterality: Left;   EYE SURGERY     bilateral cataract   MASS EXCISION Left 02/28/2020   Procedure: Biopsy of Facial Mass;  Surgeon: Izora Gala, MD;  Location: [place];  Service: ENT;  Laterality: Left;   TUBAL LIGATION     TUMOR REMOVAL  05/2020    SOCIAL HISTORY: Social History   Socioeconomic History   Marital status: Widowed    Spouse name: Not on file   Number of children: 2   Years of education: Not on file   Highest education level: Not on file  Occupational History   Occupation: Retired Therapist, sports  Tobacco Use   Smoking status: Never   Smokeless tobacco: Never  Vaping Use   Vaping Use: Never used  Substance and Sexual Activity   Alcohol use: No    Alcohol/week: 0.0 standard drinks   Drug use: No   Sexual activity: Yes  Other Topics Concern   Not on file  Social History Narrative   Not on file   Social Determinants of Health   Financial Resource Strain: Not on file  Food Insecurity: Not on file  Transportation Needs: Not on file  Physical Activity: Not on file  Stress: Not on file  Social Connections: Not on file  Intimate Partner Violence: Not on file    FAMILY HISTORY: Family History  Problem Relation Age of Onset   Hypertension Mother    Heart disease Mother    Hypertension Sister    Heart attack Sister    Hypertension Brother    Heart attack Brother     ALLERGIES:  is allergic to other, fluoride preparations, and iodinated diagnostic agents.  MEDICATIONS:  Current Outpatient Medications  Medication Sig Dispense Refill   amLODipine (NORVASC) 10 MG tablet TAKE 1 TABLET DAILY (DOSE INCREASED) 90 tablet 3   apixaban (ELIQUIS) 5 MG TABS tablet Take 1  tablet (5 mg total) by mouth 2 (two) times daily. 60 tablet 2   carvedilol (COREG) 12.5 MG tablet Take 12.5 mg by mouth 2 (two) times daily.     colchicine 0.6 MG tablet Take 0.6 mg by mouth as needed.     doxazosin (CARDURA) 2 MG tablet Take 2 mg by mouth every evening.      levothyroxine (SYNTHROID, LEVOTHROID) 25 MCG tablet Take 25 mcg by mouth daily before breakfast.     losartan (COZAAR) 100 MG tablet Take 1 tablet (100 mg total) by mouth daily. 60 tablet 3   metFORMIN (GLUCOPHAGE) 500 MG tablet Take 500 mg by mouth 2 (two) times daily with a meal.     Multiple Vitamin (MULTIVITAMIN ADULT PO) Take 1 Scoop by mouth daily.     omeprazole (PRILOSEC) 20 MG capsule TAKE 1 CAPSULE DAILY AS NEEDED (HEART BURN) (Patient taking differently: Take 20 mg by mouth daily as needed (heart burn).) 30 capsule 0   polyethylene glycol powder (GLYCOLAX/MIRALAX) powder Mix 17gm in 4oz of water.  Start with 2 doses per day, but may take up to 6 doses per day.  Titrate number of doses to allow 2-3 soft bowel movements daily. (Patient taking differently: Take 17 g by mouth daily.) 850 g 1   rosuvastatin (CRESTOR) 10 MG tablet TAKE 1 TABLET DAILY 90 tablet 3   rosuvastatin (CRESTOR) 5 MG tablet Take 5 mg by mouth daily.     spironolactone (ALDACTONE) 25 MG tablet TAKE 1 TABLET DAILY 90 tablet 3   No current facility-administered medications for this visit.    REVIEW OF SYSTEMS:   Constitutional: ( - ) fevers, ( - )  chills , ( - ) night sweats Eyes: ( - ) blurriness of vision, ( - ) double vision, ( - ) watery eyes Ears, nose, mouth, throat, and face: ( - ) mucositis, ( - ) sore throat Respiratory: ( - ) cough, ( - ) dyspnea, ( - ) wheezes Cardiovascular: ( - ) palpitation, ( - ) chest discomfort, ( - ) lower extremity swelling Gastrointestinal:  ( - ) nausea, ( - ) heartburn, ( - ) change in bowel habits Skin: ( - ) abnormal skin rashes Lymphatics: ( - ) new lymphadenopathy, ( - ) easy bruising Neurological:  ( - ) numbness, ( - ) tingling, ( - ) new weaknesses Behavioral/Psych: ( - ) mood change, ( - ) new changes  All other systems were reviewed with the patient and are negative.  PHYSICAL EXAMINATION:  Vitals:   11/20/20 0954  BP: 112/68  Pulse: 70  Resp: 17  Temp: 98 F (36.7 C)  SpO2: 99%   Filed Weights   11/20/20 0954  Weight: 171 lb 8 oz (77.8 kg)    GENERAL: well appearing elderly African American female alert,  no distress and comfortable SKIN: skin color, texture, turgor are normal, no rashes or significant lesions EYES: conjunctiva are pink and non-injected, sclera clear LUNGS: clear to auscultation and percussion with normal breathing effort HEART: regular rate & rhythm and no murmurs and no lower extremity edema Musculoskeletal: no cyanosis of digits and no clubbing  PSYCH: alert & oriented x 3, fluent speech NEURO: no focal motor/sensory deficits  LABORATORY DATA:  I have reviewed the data as listed CBC Latest Ref Rng & Units 11/20/2020 06/05/2020 05/22/2020  WBC 4.0 - 10.5 K/uL 8.9 8.4 7.6  Hemoglobin 12.0 - 15.0 g/dL 11.7(L) 10.9(L) 11.4(L)  Hematocrit 36.0 - 46.0 % 35.9(L) 33.5(L) 35.7(L)  Platelets 150 - 400 K/uL 231 242 225    CMP Latest Ref Rng & Units 11/20/2020 06/05/2020 05/22/2020  Glucose 70 - 99 mg/dL 134(H) 99 132(H)  BUN 8 - 23 mg/dL 27(H) 27(H) 18  Creatinine 0.44 - 1.00 mg/dL 1.00 0.91 0.90  Sodium 135 - 145 mmol/L 139 140 139  Potassium 3.5 - 5.1 mmol/L 4.7 4.8 4.1  Chloride 98 - 111 mmol/L 104 105 105  CO2 22 - 32 mmol/L '26 25 26  '$ Calcium 8.9 - 10.3 mg/dL 10.5(H) 10.2 9.9  Total Protein 6.5 - 8.1 g/dL 7.6 7.4 -  Total Bilirubin 0.3 - 1.2 mg/dL 0.4 0.4 -  Alkaline Phos 38 - 126 U/L 75 66 -  AST 15 - 41 U/L 16 17 -  ALT 0 - 44 U/L 15 13 -     RADIOGRAPHIC STUDIES: No results found.  ASSESSMENT & PLAN Haley Wilcox 87 y.o. female with medical history significant for an atypical lymphoproliferative mass who  presents for a follow up visit.    After review the labs, the records, discussion with the patient the diagnosis remains the same and this is an atypical lymphocyte collection.  It does not appear to represent a hematological malignancy as there is no monoclonal component.  The tissue underwent genetic testing which also shows no evidence of a  hematological malignancy.  Given the persistent growth of this lesion and its poor response to surgery I would recommend referral to radiation oncology to see if there are any services that could offer to help treat this.  Given that is not actively malignancy I do not think that there are any chemotherapy options we have available, though I would be amenable to a steroid pulse if radiation is not an option.  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 Appreciate the assistance of ENT -- genetic studies for this mass showed no signs of malignancy  --will make referral to Radiation oncology for consideration of radiation treatment. No clear indication for chemotherapy, though a steroid pulse could be considered if radiation was not feasible.  --RTC pending evaluation by radiation oncology.   Orders Placed This Encounter  Procedures   Ambulatory referral to Radiation Oncology    Referral Priority:   Routine    Referral Type:   Consultation    Referral Reason:   Specialty Services Required    Requested Specialty:   Radiation Oncology    Number of Visits Requested:   1    All questions were answered. The patient knows to call the clinic with any problems, questions or concerns.  A total of more than 25 minutes were spent on this encounter and over  half of that time was spent on counseling and coordination of care as outlined above.   Ledell Peoples, MD Department of Hematology/Oncology [place] at [facility] Phone: [phone] Pager: [phone] Email: [email or web address]  11/25/2020 7:15 PM

## 2020-11-22 NOTE — Progress Notes (Signed)
Oncology Nurse Navigator Documentation   I called Ms. Svetlik and left a voice mail introducing myself as the head and neck navigator working here at [facility]. I provided her with my direct contact information and asked her to call me back today if possible. I let her know that if I didn't speak to her today I would meet her when she sees Dr. Isidore Moos for consult on 11/26/20.  Harlow Asa RN, BSN, OCN Head & Neck Oncology Nurse [place] at [facility] Phone # [phone]  Fax # [phone]

## 2020-11-24 NOTE — Progress Notes (Signed)
Radiation Oncology         [phone] ________________________________  Initial Outpatient Consultation  Name: Haley Wilcox MRN: [id number]  Date: 11/26/2020  DOB: 1931-04-21  FB:[id number], Jori Moll, MD  Orson Slick, MD   REFERRING PHYSICIAN: Orson Slick, MD  DIAGNOSIS:    ICD-10-CM   1. Facial mass  R22.0     2. 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 female who presented with a left anterior cheek mass found by her dentist on 12/19/19. Biopsy was collected by her dentist on that date and sent to [facility]. It was read by the dental pathology staff at [facility] who noted that it was consistent with a non-Hodgkin's lymphoma. Due to their lack of expertise in this area they referred her to hematopathology who reviewed it and they believed to be an atypical B-cell rich lymphoid infiltrate and favored a reactive process over a hematological malignancy. She had no associated symptoms at the time.   CT of the neck performed on 01/19/20 demonstrated a 17 x 30 mm soft tissue mass anterior to the left maxillary sinus; with no bone obstruction or extension into the sinus seen. Mass was noted to resemble possible neoplasm. (Maxillofacial CT performed on this same date revealed identical findings).   Subsequently, the patient was referred to Dr. Lorenso Courier for consultation on 01/25/20 for further evaluation and management. During this visit, the patient reported her left upper lip to be swollen following her recent biopsy, as well as soreness in her face around the location of the lesion. She additionally reported some ocular symptoms for which she was referred to ophthalmology but the ophthalmologist reported no acute findings. Although the  patient's overall findings were not suggestive of malignancy, the location of the lesion prompted Dr. Lorenso Courier to refer the patient for evaluation by ENT for consideration of further management.   Accordingly, the patient met with Dr. Constance Holster on 02/27/20. During which time, Dr. Constance Holster recommended a repeat biopsy of the lesion. Biopsy performed the following day (02/28/20), revealed atypical lymphoid infiltrate.  During a follow-up visit with Dr. Constance Holster on 05/06/20, the patient reported growth of the facial mass and expressed interest in its removal. She additionally reported continued numbness along the left V2 distribution (ongoing since her initial biopsy on 12/19/19).   Subsequently, the patient underwent excisional biopsy of the left maxillary mass on 05/22/20 under the care of Dr. Constance Holster. Pathology from the procedure revealed no monoclonal B-cell or immunophenotypically aberrant T-cell population identified.  During a follow-up visit with Dr. Constance Holster on 11/05/20, the patient reported a small return of the left facial mass in the upper medial aspect adjacent to the left side of the nose. On physical exam, the mass was noted to measure 2 cm. At this time, Dr. Constance Holster reached out to oncology to enquire if possible radiation treatment might be useful for treatment of the mass.   Of note: Chest CT performed on 01/19/20 demonstrated a solitary 4 mm solid right lower lobe pulmonary nodule; no evidence of pulmonary disease was seen.   Swallowing issues, if any: None. Reported minor difficulty eating due to swelling following biopsies; since resolved.  Weight Changes: None  Pain status: Facial soreness around location of the lesion following initial biopsy  in September of 2021.  Other symptoms: Hearing loss bilaterally, prolonged swelling following multiple biopsies of the maxillary lesion, numbness along left upper lip and medial left cheek.  This numbness does not extend throughout the whole V2 distribution.   She believes that the numbness was provoked by her various biopsies/excisions.  She has drooping of left upper lip.  She feels some pressure around her left eye.  Tobacco history, if any: None  ETOH abuse, if any: None  Prior cancers, if any: None  PREVIOUS RADIATION THERAPY: No  PAST MEDICAL HISTORY:  has a past medical history of A-fib ([place]), Constipation, Constipation, Diabetes mellitus, GERD (gastroesophageal reflux disease), Gout, Gout, Heartburn, Hyperlipidemia, Hypertension, Hypothyroidism, Pneumonia, and Stroke ([place]) (06/07/2018).    PAST SURGICAL HISTORY: Past Surgical History:  Procedure Laterality Date   ABDOMINAL HYSTERECTOMY     d &c     DILATION AND CURETTAGE OF UTERUS     EXCISION MASS HEAD Left 05/22/2020   Procedure: Sublabial Approach Resection of Left Maxillary Mass;  Surgeon: Izora Gala, MD;  Location: [place];  Service: ENT;  Laterality: Left;   EYE SURGERY     bilateral cataract   MASS EXCISION Left 02/28/2020   Procedure: Biopsy of Facial Mass;  Surgeon: Izora Gala, MD;  Location: [facility];  Service: ENT;  Laterality: Left;   TUBAL LIGATION     TUMOR REMOVAL  05/2020    FAMILY HISTORY: family history includes Heart attack in her brother and sister; Heart disease in her mother; Hypertension in her brother, mother, and sister.  SOCIAL HISTORY:  reports that she has never smoked. She has never used smokeless tobacco. She reports that she does not drink alcohol and does not use drugs.  ALLERGIES: Other, Fluoride preparations, and Iodinated diagnostic agents  MEDICATIONS:  Current Outpatient Medications  Medication Sig Dispense Refill   amLODipine (NORVASC) 10 MG tablet TAKE 1 TABLET DAILY (DOSE INCREASED) 90 tablet 3   apixaban (ELIQUIS) 5 MG TABS tablet Take 1 tablet (5 mg total) by mouth 2 (two) times daily. 60 tablet 2   carvedilol (COREG) 12.5 MG tablet Take 12.5 mg by mouth 2 (two) times daily.     colchicine 0.6 MG tablet Take 0.6 mg by  mouth as needed.     doxazosin (CARDURA) 2 MG tablet Take 2 mg by mouth every evening.      levothyroxine (SYNTHROID, LEVOTHROID) 25 MCG tablet Take 25 mcg by mouth daily before breakfast.     losartan (COZAAR) 100 MG tablet Take 1 tablet (100 mg total) by mouth daily. 60 tablet 3   metFORMIN (GLUCOPHAGE) 500 MG tablet Take 500 mg by mouth 2 (two) times daily with a meal.     Multiple Vitamin (MULTIVITAMIN ADULT PO) Take 1 Scoop by mouth daily.     omeprazole (PRILOSEC) 20 MG capsule TAKE 1 CAPSULE DAILY AS NEEDED (HEART BURN) (Patient taking differently: Take 20 mg by mouth daily as needed (heart burn).) 30 capsule 0   polyethylene glycol powder (GLYCOLAX/MIRALAX) powder Mix 17gm in 4oz of water.  Start with 2 doses per day, but may take up to 6 doses per day.  Titrate number of doses to allow 2-3 soft bowel movements daily. (Patient taking differently: Take 17 g by mouth daily.) 850 g 1   rosuvastatin (CRESTOR) 10 MG tablet TAKE 1 TABLET DAILY 90 tablet 3   rosuvastatin (CRESTOR) 5 MG tablet Take 5 mg by mouth daily.     spironolactone (ALDACTONE) 25 MG  tablet TAKE 1 TABLET DAILY 90 tablet 3   No current facility-administered medications for this encounter.    REVIEW OF SYSTEMS:  Notable for that above.   PHYSICAL EXAM:  height is $RemoveB'5\' 3"'nojaUfnd$  (1.6 m) and weight is 173 lb 6 oz (78.6 kg). Her temporal temperature is 96 F (35.6 C) (abnormal). Her blood pressure is 126/60 and her pulse is 61. Her respiration is 18 and oxygen saturation is 98%.   General: Alert and oriented, in no acute distress HEENT: Head is normocephalic. Extraocular movements are intact.  Dentition in good repair.  Oropharynx is notable for no lesions in the mouth or throat.  There is drooping of the left upper lip.  There is firmness in the left upper lip (mustache area) to palpation and some mild firmness in the medial left cheek.  Hard of hearing at times. Neck: Neck is notable for no masses Heart: Regular in rate with no  murmurs.  Slightly irregular rhythm Chest: Clear to auscultation bilaterally, with no rhonchi, wheezes, or rales. Abdomen: Soft, nontender, nondistended, with no rigidity or guarding. Lymphatics: see Neck Exam Skin: No concerning lesions. Musculoskeletal: symmetric strength and muscle tone throughout. Neurologic: Reduced sensation to light touch over the left mustache region and medial left cheek.  Left upper lip droop,  no obvious focalities. Speech is fluent. Coordination is intact. Psychiatric: Judgment and insight are intact. Affect is appropriate.   ECOG = 1  0 - Asymptomatic (Fully active, able to carry on all predisease activities without restriction)  1 - Symptomatic but completely ambulatory (Restricted in physically strenuous activity but ambulatory and able to carry out work of a light or sedentary nature. For example, light housework, office work)  2 - Symptomatic, <50% in bed during the day (Ambulatory and capable of all self care but unable to carry out any work activities. Up and about more than 50% of waking hours)  3 - Symptomatic, >50% in bed, but not bedbound (Capable of only limited self-care, confined to bed or chair 50% or more of waking hours)  4 - Bedbound (Completely disabled. Cannot carry on any self-care. Totally confined to bed or chair)  5 - Death   Eustace Pen MM, Creech RH, Tormey DC, et al. [phone]). "Toxicity and response criteria of the [facility]". [place] Oncol. 5 (6): 649-55   LABORATORY DATA:  Lab Results  Component Value Date   WBC 8.9 11/20/2020   HGB 11.7 (L) 11/20/2020   HCT 35.9 (L) 11/20/2020   MCV 85.9 11/20/2020   PLT 231 11/20/2020   CMP     Component Value Date/Time   NA 139 11/20/2020 0906   NA 142 11/14/2019 0847   K 4.7 11/20/2020 0906   CL 104 11/20/2020 0906   CO2 26 11/20/2020 0906   GLUCOSE 134 (H) 11/20/2020 0906   BUN 27 (H) 11/20/2020 0906   BUN 20 11/14/2019 0847   CREATININE 1.00 11/20/2020  0906   CALCIUM 10.5 (H) 11/20/2020 0906   PROT 7.6 11/20/2020 0906   PROT 7.6 11/14/2019 0847   ALBUMIN 4.0 11/20/2020 0906   ALBUMIN 4.6 11/14/2019 0847   AST 16 11/20/2020 0906   ALT 15 11/20/2020 0906   ALKPHOS 75 11/20/2020 0906   BILITOT 0.4 11/20/2020 0906   GFRNONAA 55 (L) 11/20/2020 0906   GFRAA 65 11/14/2019 0847      Lab Results  Component Value Date   TSH 3.030 11/14/2019     RADIOGRAPHY: As above.  I  personally reviewed her imaging   IMPRESSION/PLAN: Today I talked to the patient about the inconclusive pathology reports and the uncertainty regarding her diagnosis.  Given that the left facial mass continues to progress, I agree that we should be proactive about exploring treatment options.  However, medical oncology is not comfortable giving her systemic therapy given the uncertainty as to whether this is in fact a malignant process.  I feel similarly from the standpoint of giving radiation at this time. I believe it's best for her to methodically pursue a second opinion at a University before finalizing treatment options.  I also explained that it would be prudent for her to get additional imaging (as her imaging is not up-to-date) but I will not order anything at this time in case the team that she sees at the [facility] has specific recommendations.  She understands that imaging may include an MRI trigeminal protocol to look at the primary site as well as the cranial nerves and possibly a PET scan for staging.  [facility] has an excellent program and both head and neck cancer and lymphoma/hematologic malignancies.  We will refer her to both medical oncology and radiation oncology and ask for pathology review as the pathology is really what we will try for treatment decisions.  I told her that we will request that Dr.Brizel ([facility] radiation oncologist specializing in head and neck cancer) see her unless [facility] recommends a different subspecialist specific to lymphoma.  She is  very pleased with this plan.  I told her that if ultimately she does need chemotherapy and/or radiation therapy that we are happy to provide this close to home.  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  Anderson Malta, our head and neck navigator, was here for the consultation and will navigate accordingly.  I also will refer the patient to our dentist for pretreatment assessment, assuming that she ultimately may receive radiation therapy.   On date of service, in total, I spent 50 minutes on this encounter. Patient was seen in person.  __________________________________________   Eppie Gibson, MD  This document serves as a record of services personally performed by Eppie Gibson, MD. It was created on her behalf by Roney Mans, a trained medical scribe. The creation of this record is based on the scribe's personal observations and the provider's statements to them. This document has been checked and approved by the attending provider.

## 2020-11-26 ENCOUNTER — Other Ambulatory Visit: Payer: Self-pay

## 2020-11-26 ENCOUNTER — Ambulatory Visit
Admission: RE | Admit: 2020-11-26 | Discharge: 2020-11-26 | Disposition: A | Discharge status: Home / Self Care | Payer: Medicare Other | Source: Ambulatory Visit | Attending: Radiation Oncology | Admitting: Radiation Oncology

## 2020-11-26 ENCOUNTER — Telehealth: Payer: Self-pay | Admitting: Hematology and Oncology

## 2020-11-26 VITALS — BP 126/60 | HR 61 | Temp 96.0°F | Resp 18 | Ht 63.0 in | Wt 173.4 lb

## 2020-11-26 DIAGNOSIS — R220 Localized swelling, mass and lump, head: Secondary | ICD-10-CM | POA: Diagnosis not present

## 2020-11-26 DIAGNOSIS — D492 Neoplasm of unspecified behavior of bone, soft tissue, and skin: Secondary | ICD-10-CM

## 2020-11-26 NOTE — Telephone Encounter (Signed)
Scheduled appt per 9/5 sch msg. Pt is aware

## 2020-11-26 NOTE — Progress Notes (Signed)
[place]

## 2020-11-26 NOTE — Progress Notes (Signed)
Oncology Nurse Navigator Documentation   Met with patient during initial consult with Dr. Isidore Moos.  Further introduced myself as her Navigator, explained my role as a member of the Care Team. Assisted with post-consult appt scheduling. I discussed the location of Dr. Raynelle Dick office. She verbalized understanding of information provided. I placed a referral to [facility] for a second opinion with medical and radiation oncology per Dr. Pearlie Oyster request.  She signed a release of information for her records to be sent to [facility] as needed. I encouraged her to call with questions/concerns moving forward.  Harlow Asa, RN, BSN, OCN Head & Neck Oncology Nurse [place] [facility] [place] [phone]

## 2020-11-27 ENCOUNTER — Encounter: Payer: Self-pay | Admitting: Radiation Oncology

## 2020-12-02 ENCOUNTER — Other Ambulatory Visit (HOSPITAL_COMMUNITY): Payer: Self-pay | Admitting: Cardiology

## 2020-12-02 DIAGNOSIS — E782 Mixed hyperlipidemia: Secondary | ICD-10-CM | POA: Diagnosis not present

## 2020-12-03 ENCOUNTER — Encounter (HOSPITAL_COMMUNITY): Payer: Self-pay | Admitting: Dentistry

## 2020-12-03 ENCOUNTER — Ambulatory Visit (INDEPENDENT_AMBULATORY_CARE_PROVIDER_SITE_OTHER): Payer: Medicare Other | Admitting: Dentistry

## 2020-12-03 ENCOUNTER — Other Ambulatory Visit: Payer: Self-pay

## 2020-12-03 DIAGNOSIS — M270 Developmental disorders of jaws: Secondary | ICD-10-CM | POA: Diagnosis not present

## 2020-12-03 DIAGNOSIS — K08109 Complete loss of teeth, unspecified cause, unspecified class: Secondary | ICD-10-CM

## 2020-12-03 DIAGNOSIS — R220 Localized swelling, mass and lump, head: Secondary | ICD-10-CM

## 2020-12-03 DIAGNOSIS — Z7901 Long term (current) use of anticoagulants: Secondary | ICD-10-CM | POA: Diagnosis not present

## 2020-12-03 DIAGNOSIS — K06010 Localized gingival recession, unspecified: Secondary | ICD-10-CM | POA: Diagnosis not present

## 2020-12-03 LAB — LIPID PANEL
Chol/HDL Ratio: 2.7 ratio (ref 0.0–4.4)
Cholesterol, Total: 125 mg/dL (ref 100–199)
HDL: 47 mg/dL (ref >39)
LDL Chol Calc (NIH): 61 mg/dL (ref 0–99)
Triglycerides: 91 mg/dL (ref 0–149)
VLDL Cholesterol Cal: 17 mg/dL (ref 5–40)

## 2020-12-03 NOTE — Progress Notes (Signed)
Department of Dental Medicine          OUTPATIENT CONSULT   Service Date:   12/03/2020  Patient Name:   Haley Wilcox Date of Birth:   [birth year] Medical Record Number: [id number]  Referring Provider:                Eppie Gibson, M.D.   >  Plan/Recommendations <   Assessment There are no current signs of acute odontogenic infection including abscess, edema or erythema, or suspicious lesion requiring biopsy.    Recommendations No dental intervention indicated prior to radiation at this time.   Plan Discuss case with medical team and coordinate future treatment as needed should the patient definitively have radiation. Continue to follow patient's status and follow-up as needed. Call if any questions or concerns arise.  Discussed in detail all treatment options and recommendations with the patient and they are agreeable to the plan.    Thank you for consulting with Hospital [facility] and for the opportunity to participate in this patient's treatment.  Should you have any questions or concerns, please contact the [facility] at [phone].   _0 @ Consult Note:  COVID-19 SCREENING:  The patient denies symptoms concerning for COVID-19 infection including fever, chills, cough, or newly developed shortness of breath.  HISTORY OF PRESENT ILLNESS: Illene ROOSEVELT Wilcox is a very pleasant 87 y.o. 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  The patient presents today for a medically necessary dental consultation as part of their pre-radiotherapy  work-up.   DENTAL HISTORY: The patient does have a dentist that she sees regularly, Dr. Kennyth Arnold, who she presented to in 11/2019 with a left anterior cheek mass which was found during her routine dental exam. At that time, biopsy was collected by her dentist and was sent to [facility]. It was read by the dental pathology staff at [facility] who noted that it was consistent with a non-Hodgkin's lymphoma. Dental pathology at [facility] subsequently referred her to hematopathology for further review and they believed to be an atypical B-cell rich lymphoid infiltrate and favored a reactive process over a hematological malignancy. She had no associated symptoms at this time.  A CT scan of the neck performed on 01/19/20.  She was then referred to Dr. Lorenso Courier for consultation on 01/25/20 for further evaluation and management. During this visit, the patient reported her left upper lip to be swollen following her recent biopsy, as well as soreness in her face around the location of the lesion. She additionally reported some ocular symptoms for which she was referred to ophthalmology but the ophthalmologist reported no acute findings. Although the patient's overall findings were not suggestive of malignancy, the location of the lesion prompted Dr. Lorenso Courier to refer the patient for evaluation by ENT for consideration of further management. Accordingly, the patient met with Dr. Constance Holster on 02/27/20. During which time, Dr. Constance Holster recommended a repeat biopsy of the lesion. Biopsy performed the following day (02/28/20), revealed atypical lymphoid infiltrate. During a follow-up visit with Dr. Constance Holster on 05/06/20, the patient reported growth of the facial mass and expressed interest in its removal. She  additionally reported continued numbness along the left V2 distribution (ongoing since her initial biopsy on 12/19/19). Subsequently, the patient underwent excisional biopsy of the left maxillary mass on 05/22/20 under the care of Dr. Constance Holster.  Pathology from the procedure revealed no monoclonal B-cell or immunophenotypically aberrant T-cell population identified. During a follow-up visit with Dr. Constance Holster on 11/05/20, the patient reported a small return of the left facial mass in the upper medial aspect adjacent to the left side of the nose. On physical exam, the mass was noted to measure 2 cm. At this time, Dr. Constance Holster reached out to oncology to see if possible radiation treatment might be useful for treatment of the mass. Swallowing issues: None. Reported minor difficulty eating due to swelling following biopsies; since resolved. Pain scale: Facial soreness around location of the lesion following initial biopsy in September of 2021. She currently denies any dental/odontogenic pain or sensitivity. Other symptoms: Hearing loss bilaterally, prolonged swelling following multiple biopsies of the maxillary lesion, numbness along left upper lip and medial left cheek (numbness does not extend throughout the whole V2 distribution.  She believes that the numbness was provoked by her various biopsies/excisions.  She has drooping of left upper lip.  She feels some pressure around her left eye.   CHIEF COMPLAINT:  Here for a pre-head and neck radiation dental exam.   Patient Active Problem List   Diagnosis Date Noted   Facial mass 05/22/2020   Hypertension associated with type 2 diabetes mellitus ([place]) 01/15/2020   H/O: stroke 08/24/2019   Asymptomatic bilateral carotid artery stenosis 02/23/2019   Hypothyroidism 09/28/2018   Cerebrovascular accident (CVA) due to embolism of left anterior cerebral artery ([place]) 07/08/2018   CVA (cerebral vascular accident) ([place]) 06/08/2018   TIA (transient ischemic attack) 06/07/2018   Persistent atrial fibrillation ([place]) 06/07/2018   Fatigue 03/30/2017   Type 2 diabetes mellitus without complication, without long-term current use of insulin ([place]) 03/30/2017   Vitamin D deficiency 03/30/2017   Idiopathic gout of  left knee 06/06/2012   Iron deficiency anemia 06/03/2012   Sinus tachycardia 06/02/2012   Mixed hyperlipidemia 10/23/2010   Diabetes mellitus type II, controlled ([place]) 11/23/2006   Essential hypertension 10/13/2006   Past Medical History:  Diagnosis Date   A-fib ([place])    Constipation    Constipation    Diabetes mellitus    GERD (gastroesophageal reflux disease)    Gout    Gout    Heartburn    Hyperlipidemia    Hypertension    Hypothyroidism    Pneumonia    Stroke ([place]) 06/07/2018   Past Surgical History:  Procedure Laterality Date   ABDOMINAL HYSTERECTOMY     d &c     DILATION AND CURETTAGE OF UTERUS     EXCISION MASS HEAD Left 05/22/2020   Procedure: Sublabial Approach Resection of Left Maxillary Mass;  Surgeon: Izora Gala, MD;  Location: [facility];  Service: ENT;  Laterality: Left;   EYE SURGERY     bilateral cataract   MASS EXCISION Left 02/28/2020   Procedure: Biopsy of Facial Mass;  Surgeon: Izora Gala, MD;  Location: [facility];  Service: ENT;  Laterality: Left;   TUBAL LIGATION     TUMOR REMOVAL  05/2020   Allergies  Allergen Reactions   Other     Other reaction(s): swelling of lips   Fluoride Preparations Swelling    "Lips swell" when brushing teeth with toothpaste that has flouride    Iodinated Diagnostic Agents Swelling  Swelling of lips and tongue- per pt 01/09/2020 Other reaction(s): hives   Current Outpatient Medications  Medication Sig Dispense Refill   amLODipine (NORVASC) 10 MG tablet TAKE 1 TABLET DAILY (DOSE INCREASED) 90 tablet 3   apixaban (ELIQUIS) 5 MG TABS tablet Take 1 tablet (5 mg total) by mouth 2 (two) times daily. 60 tablet 2   carvedilol (COREG) 12.5 MG tablet Take 12.5 mg by mouth 2 (two) times daily.     colchicine 0.6 MG tablet Take 0.6 mg by mouth as needed.     doxazosin (CARDURA) 2 MG tablet Take 2 mg by mouth every evening.      levothyroxine (SYNTHROID, LEVOTHROID) 25 MCG tablet Take 25 mcg by mouth daily before  breakfast.     losartan (COZAAR) 100 MG tablet Take 1 tablet (100 mg total) by mouth daily. 60 tablet 3   metFORMIN (GLUCOPHAGE) 500 MG tablet Take 500 mg by mouth 2 (two) times daily with a meal.     Multiple Vitamin (MULTIVITAMIN ADULT PO) Take 1 Scoop by mouth daily.     omeprazole (PRILOSEC) 20 MG capsule TAKE 1 CAPSULE DAILY AS NEEDED (HEART BURN) (Patient taking differently: Take 20 mg by mouth daily as needed (heart burn).) 30 capsule 0   polyethylene glycol powder (GLYCOLAX/MIRALAX) powder Mix 17gm in 4oz of water.  Start with 2 doses per day, but may take up to 6 doses per day.  Titrate number of doses to allow 2-3 soft bowel movements daily. (Patient taking differently: Take 17 g by mouth daily.) 850 g 1   rosuvastatin (CRESTOR) 10 MG tablet TAKE 1 TABLET DAILY 90 tablet 3   rosuvastatin (CRESTOR) 5 MG tablet Take 5 mg by mouth daily.     spironolactone (ALDACTONE) 25 MG tablet TAKE 1 TABLET DAILY 90 tablet 3   No current facility-administered medications for this visit.    LABS: Lab Results  Component Value Date   WBC 8.9 11/20/2020   HGB 11.7 (L) 11/20/2020   HCT 35.9 (L) 11/20/2020   MCV 85.9 11/20/2020   PLT 231 11/20/2020      Component Value Date/Time   NA 139 11/20/2020 0906   NA 142 11/14/2019 0847   K 4.7 11/20/2020 0906   CL 104 11/20/2020 0906   CO2 26 11/20/2020 0906   GLUCOSE 134 (H) 11/20/2020 0906   BUN 27 (H) 11/20/2020 0906   BUN 20 11/14/2019 0847   CREATININE 1.00 11/20/2020 0906   CALCIUM 10.5 (H) 11/20/2020 0906   GFRNONAA 55 (L) 11/20/2020 0906   GFRAA 65 11/14/2019 0847   Lab Results  Component Value Date   INR 1.0 06/07/2018   INR 0.98 06/01/2012   No results found for: PTT  Social History   Socioeconomic History   Marital status: Widowed    Spouse name: Not on file   Number of children: 2   Years of education: Not on file   Highest education level: Not on file  Occupational History   Occupation: Retired Therapist, sports  Tobacco Use    Smoking status: Never   Smokeless tobacco: Never  Vaping Use   Vaping Use: Never used  Substance and Sexual Activity   Alcohol use: No    Alcohol/week: 0.0 standard drinks   Drug use: No   Sexual activity: Yes  Other Topics Concern   Not on file  Social History Narrative   Not on file   Social Determinants of Health   Financial Resource Strain: Not on file  Food  Insecurity: Not on file  Transportation Needs: Not on file  Physical Activity: Not on file  Stress: Not on file  Social Connections: Not on file  Intimate Partner Violence: Not on file   Family History  Problem Relation Age of Onset   Hypertension Mother    Heart disease Mother    Hypertension Sister    Heart attack Sister    Hypertension Brother    Heart attack Brother     REVIEW OF SYSTEMS:  Reviewed with the patient as per HPI. Psych: Patient denies having dental phobia.   VITAL SIGNS: BP (!) 130/51 (BP Location: Right Arm, Patient Position: Sitting, Cuff Size: Normal)   Pulse (!) 51   Temp 98.2 F (36.8 C) (Oral)    PHYSICAL EXAM: General:  Well-developed, comfortable and in no apparent distress. Neurological:  Alert and oriented to person, place and  time. Extraoral:  No swelling or lymphadenopathy.  TMJ asymptomatic without clicks or crepitations. (+) Facial symmetry- Left side, upper lip droops; patient reports numbness/loss of sensation to light touch in this area. >> Maximum Interincisal Opening:  45 mm Intraoral:  Soft tissues appear well-perfused and mucous membranes moist.  FOM and vestibules soft and not raised. Oral cavity without mass or lesion. No signs of infection, parulis, sinus tract, edema or erythema evident upon exam.   (+) Palatal tori (+) Mandibular tori   DENTAL EXAM: Hard tissue exam completed and charted. Overall impression:  Good remaining dentition. Oral hygiene:  Good    Periodontal:  Pink, healthy gingival tissue with blunted papilla.  Localized gingival  recession. Removable/fixed prosthodontics:  #3, #4, #5, #12, #13, #15, #30 and #31 have PFM crowns; #8, #24 and #25 have all-ceramic crowns; #18-#20 is a 3-unit PFM bridge replacing #19 (pontic). Occlusion:  Class I molar occlusion   RADIOGRAPHIC EXAM:  PAN and Full Mouth Series exposed and interpreted.  Condyles seated bilaterally in fossas.  No evidence of abnormal pathology.  All visualized osseous structures appear WNL.  Localized mild horizontal bone loss consistent with mild periodontitis vs recession on a healthy periodontium.  Missing teeth- #1, #16, #17, #19 and #32. Multiple existing restorations and crowns. 3-unit bridge on lower left- #18 and #20 are abutments and #19 is pontic.   ASSESSMENT:  1.  Facial mass, lymphoid neoplasm 2.  Pre-radiation therapy dental exam 3.  Long-term (current use) of anticoagulation (on apixaban) 4.  Missing teeth 5.  Gingival recession, localized 6.  Palatal tori 7.  Mandibular tori 8.  Postoperative bleeding risk   PROCEDURES: The common and significant side effects of radiation therapy to the head and neck were explained and discussed with the patient.  The discussion included side effects of trismus (limited opening), dysgeusia (loss of taste), xerostomia (dry mouth), radiation caries and osteoradionecrosis of the jaw.  I also discussed the importance of maintaining optimal oral hygiene and oral health before, during and after radiation to decrease the risk of developing radiation cavities and the need for any surgery such as extractions after therapy.    Upper and Lower alginate impressions taken and poured up in Type IV [employer] for fabrication of scatter protection devices AND/OR fluoride trays in the case that the patient does end up receiving radiation.   PLAN AND RECOMMENDATIONS: I discussed the risks, benefits, and complications of various scenarios with the patient in relationship to their medical and dental conditions, which  included systemic infection or other serious issues such as osteoradionecrosis that could potentially occur either before, during  or after their anticipated radiation therapy if dental/oral concerns are not addressed.  I explained that if any chronic or acute dental/oral infection(s) are addressed and subsequently not maintained following medical optimization and recovery, their risk of the previously mentioned complications are just as high and could potentially occur postoperatively.  I explained all significant findings of the dental consultation with the patient and the recommended care including continuing to see her primary dentist every 6 mos for cleanings and exams in order to optimize them following her potential head and neck radiation therapy from a dental standpoint.  The patient verbalized understanding of all findings, discussion, and recommendations and is agreeable to the plan. We then discussed various treatment options in the case she does or does not receive radiotherapy.  We will continue to follow her status and schedule her as needed in the future.  She verbalized understanding. Plan to discuss all findings and recommendations with medical team and coordinate future care as needed.   All questions and concerns were invited and addressed.  The patient tolerated today's visit well and departed in stable condition.  I spent in excess of 120 minutes during the conduct of this consultation and >50% of this time involved direct face-to-face encounter for counseling and/or coordination of the patient's care. Elwood Benson [place], D.M.D.

## 2020-12-03 NOTE — Patient Instructions (Signed)
[facility] Department of Dental Medicine Kenley Rettinger B. Johnathin Vanderschaaf, D.M.D. Phone: [phone] Fax: [phone]   It was a pleasure seeing you today!  Please refer to the information below regarding your dental visit with us.  Call if you have any questions or concerns that come up after you leave.   Thank you for letting us provide care for you.  If there is anything we can do for you, please let us know.    RADIATION THERAPY AND INFORMATION REGARDING YOUR TEETH   XEROSTOMIA (DRY MOUTH):  Your salivary glands may be in the field of radiation.  Radiation may include all or only part of your salivary glands.  This will cause your saliva to dry up, and you will have a dry mouth.  The dry mouth will be for the rest of your life unless your radiation oncologist tells you otherwise.  Your saliva has many functions: It wets your tongue for speaking. It coats your teeth and the inside of your mouth for easier movement. It helps with chewing and swallowing food. It helps clean away harmful acid and toxic products made by the germs in your mouth, therefore it helps prevent cavities. It kills some germs in your mouth and helps to prevent gum disease. It helps to carry flavor to your taste buds.  Once you have lost your saliva, you will be at higher risk for tooth decay and gum disease.    What can be done to help improve your mouth when there's not enough saliva? Your dentist may give a recommendation for CLoSYS.  It will not bring back all of your saliva but may bring back some of it.  Also, your saliva may be thick and ropy or white and foamy.  It will not feel like it use to feel. You will need to swish with water every time your mouth feels dry.  YOU CANNOT suck on any cough drops, mints, lemon drops, candy, vitamin C or any other products.  You cannot use anything other than water to make your mouth feel less dry.  If you want to drink anything else, you have to drink it all at once and brush  afterwards.  Be sure to discuss the details of your diet habits with your dentist or hygienist.   RADIATION CARIES:  This is decay (cavities) that happens very quickly once your mouth is very dry due to radiation therapy.  Normally, cavities take six months to two years to become a problem.  When you have dry mouth, cavities may take as little as eight weeks to cause you a problem.    Dental check-ups every two months are necessary as long as you have a dry mouth. Radiation caries typically, but not always, start at your gum line where it is hard to see the cavity.  It is therefore also hard to fill these cavities adequately.  This high rate of cavities happens because your mouth no longer has saliva and therefore the acid made by the germs starts the decay process.  Whenever you eat anything the germs in your mouth change the food into acid.  The acid then burns a small hole in your tooth.  This small hole is the beginning of a cavity.  If this is not treated then it will grow bigger and become a cavity.  The way to avoid this hole getting bigger is to use fluoride every evening as prescribed by your dentist following your radiation. NOTE:  You have to make sure   that your teeth are very clean before you use the fluoride.  This fluoride in turn will strengthen your teeth and prepare them for another day of fighting acid. If you develop radiation caries many times, the damage is so large that you will have to have all your teeth removed.  This could be a big problem if some of these teeth are in the field of radiation.  Further details of why this could be a big problem will follow (see Osteoradionecrosis below).   DYSGEUSIA (LOSS OF TASTE):  This happens to varying degrees once you've had radiation therapy to your jaw region.  Many times taste is not completely lost, but becomes limited.  The loss of taste is mostly due to radiation affecting your taste buds.  However, if you have no saliva in your mouth  to carry the flavor to your taste buds, it would be difficult for your taste buds to taste anything.  That is why using water or a prescription for Sal[place] prior to meals and during meal times may help with some of the taste.  Keep in mind that taste generally returns very slowly over the course of several months or several years after radiation therapy.  Don't give up hope.   TRISMUS (LIMITED JAW OPENING):  According to your radiation oncologist, your TMJ or jaw joints are going to be partially or fully in the field of radiation.  This means that over time the muscles that help you open and close your mouth may get stiff.  This will potentially result in your not being able to open your mouth wide enough or as wide as you can open it now.    Let me give you an example of how slowly this happens and how unaware people are of it:   A gentlemen that had radiation therapy two years ago came back to me complaining that bananas are just too large for him to be able to fit them in between his teeth.  He was not able to open wide enough to bite into a banana.  This happens slowly and over a period of time.  What we do to try and prevent this:   Your dentist will probably give you a stack of sticks called a trismus exercise device.  This stack will help remind your muscles and your jaw joints to open up to the same distance every day.  Use these sticks every morning when you wake up, or according to the instructions given by your dentist.    You must use these sticks for at least one to two years after radiation therapy.  The reason for that is because it happens so slowly and keeps going on for about two years after radiation therapy.  Your hospital dentist will help you monitor your mouth opening and make sure that it's not getting smaller after radiation.  TRISMUS EXERCISES: Using the stack of sticks given to you by your dentist, place the stack in your mouth and hold onto the other end for support. Leave  the sticks in your mouth while holding the other end.  Allow 30 seconds for muscle stretching. Rest for a few seconds. Repeat 3-5 times. This exercise is recommended in the mornings and evenings unless otherwise instructed. The exercise should be done for a period of 2 YEARS after the end of radiation. Your maximum jaw opening should be checked regularly at recall dental visits by your general dentist. You should report any changes, soreness, or difficulties encountered   when doing the exercises to your dentist.   OSTEORADIONECROSIS (ORN):  This is a condition where your jaw bone after radiation therapy becomes very dry.  It has very little blood supply to keep it alive.  If you develop a cavity that turns into an abscess or an infection, then the jaw bone does not have enough blood supply to help fight the infection.  At this point it is very likely that the infection could cause the death of your jaw bone.  When you have dead bone it has to be removed.  Therefore, you might end up having to have surgery to remove part of your jaw bone, the part of the jaw bone that has been affected.     Healing is also a problem if you are to have surgery (like a tooth extraction) in the areas where the bone has had radiation therapy.  If you have surgery, you need more blood supply to heal which is not available.  When blood supply and oxygen are not available, there is a chance for the bone to die. Occasionally, ORN happens on its own with no obvious reason, but this is quite rare.  We believe that patients who continue to smoke and/or drink alcohol have a higher chance of having this problem. Once your jaw bone has had radiation therapy, if there are any remaining teeth in that area, it is not recommended to have them pulled unless your dentist or oral surgeon is aware of your history of radiation and believes it is safe.  The risks for ORN either from infection or spontaneously occurring (with no reason) are life  long.   QUESTIONS? Call our office during office hours at [phone].  

## 2020-12-04 ENCOUNTER — Encounter (HOSPITAL_COMMUNITY): Payer: Self-pay | Admitting: Otolaryngology

## 2020-12-11 DIAGNOSIS — C310 Malignant neoplasm of maxillary sinus: Secondary | ICD-10-CM | POA: Diagnosis not present

## 2020-12-14 DIAGNOSIS — Z8572 Personal history of non-Hodgkin lymphomas: Secondary | ICD-10-CM | POA: Diagnosis not present

## 2020-12-14 DIAGNOSIS — C310 Malignant neoplasm of maxillary sinus: Secondary | ICD-10-CM | POA: Diagnosis not present

## 2020-12-16 DIAGNOSIS — E119 Type 2 diabetes mellitus without complications: Secondary | ICD-10-CM | POA: Diagnosis not present

## 2020-12-16 DIAGNOSIS — I10 Essential (primary) hypertension: Secondary | ICD-10-CM | POA: Diagnosis not present

## 2020-12-16 DIAGNOSIS — I480 Paroxysmal atrial fibrillation: Secondary | ICD-10-CM | POA: Diagnosis not present

## 2020-12-16 DIAGNOSIS — E7800 Pure hypercholesterolemia, unspecified: Secondary | ICD-10-CM | POA: Diagnosis not present

## 2020-12-16 DIAGNOSIS — I639 Cerebral infarction, unspecified: Secondary | ICD-10-CM | POA: Diagnosis not present

## 2020-12-16 DIAGNOSIS — I2510 Atherosclerotic heart disease of native coronary artery without angina pectoris: Secondary | ICD-10-CM | POA: Diagnosis not present

## 2020-12-16 DIAGNOSIS — E1169 Type 2 diabetes mellitus with other specified complication: Secondary | ICD-10-CM | POA: Diagnosis not present

## 2020-12-16 DIAGNOSIS — E039 Hypothyroidism, unspecified: Secondary | ICD-10-CM | POA: Diagnosis not present

## 2020-12-16 DIAGNOSIS — E1129 Type 2 diabetes mellitus with other diabetic kidney complication: Secondary | ICD-10-CM | POA: Diagnosis not present

## 2020-12-16 LAB — SURGICAL PATHOLOGY

## 2020-12-17 DIAGNOSIS — C8309 Small cell B-cell lymphoma, extranodal and solid organ sites: Secondary | ICD-10-CM | POA: Diagnosis not present

## 2020-12-18 DIAGNOSIS — C760 Malignant neoplasm of head, face and neck: Secondary | ICD-10-CM | POA: Diagnosis not present

## 2020-12-18 DIAGNOSIS — J3489 Other specified disorders of nose and nasal sinuses: Secondary | ICD-10-CM | POA: Diagnosis not present

## 2020-12-20 ENCOUNTER — Other Ambulatory Visit: Payer: Self-pay

## 2020-12-20 ENCOUNTER — Encounter: Payer: Self-pay | Admitting: Cardiology

## 2020-12-20 ENCOUNTER — Ambulatory Visit: Payer: Medicare Other | Admitting: Cardiology

## 2020-12-20 VITALS — BP 129/58 | HR 69 | Temp 97.2°F | Ht 63.0 in | Wt 173.0 lb

## 2020-12-20 DIAGNOSIS — Z8673 Personal history of transient ischemic attack (TIA), and cerebral infarction without residual deficits: Secondary | ICD-10-CM

## 2020-12-20 DIAGNOSIS — I10 Essential (primary) hypertension: Secondary | ICD-10-CM

## 2020-12-20 DIAGNOSIS — I6523 Occlusion and stenosis of bilateral carotid arteries: Secondary | ICD-10-CM | POA: Diagnosis not present

## 2020-12-20 DIAGNOSIS — I4819 Other persistent atrial fibrillation: Secondary | ICD-10-CM | POA: Diagnosis not present

## 2020-12-20 DIAGNOSIS — E782 Mixed hyperlipidemia: Secondary | ICD-10-CM | POA: Diagnosis not present

## 2020-12-20 DIAGNOSIS — E119 Type 2 diabetes mellitus without complications: Secondary | ICD-10-CM | POA: Diagnosis not present

## 2020-12-20 MED ORDER — ENOXAPARIN SODIUM 80 MG/0.8ML IJ SOSY: 80.0000 mg | PREFILLED_SYRINGE | Freq: Two times a day (BID) | INTRAMUSCULAR | 1 refills | Status: AC

## 2020-12-20 NOTE — Progress Notes (Signed)
Subjective:   Haley Wilcox, female    DOB: 05/26/1931, 87 y.o.   MRN: [id number]   Chief complaint:  Atrial fibrillation   HPI   87 y.o. African-American female with hypertension, type 2 diabetes mellitus, hyperlipidemia, iron deficiency anemia, recent Lt ACA stroke 05/2018, persistent Afib, mod b/l carotid stenoses.  Patient is doing well from cardiac standpoint. 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 She underwent MRI and is going to undergo biopsy at [facility].   Current Outpatient Medications on File Prior to Visit  Medication Sig Dispense Refill   amLODipine (NORVASC) 10 MG tablet TAKE 1 TABLET DAILY (DOSE INCREASED) 90 tablet 3   apixaban (ELIQUIS) 5 MG TABS tablet Take 1 tablet (5 mg total) by mouth 2 (two) times daily. 60 tablet 2   carvedilol (COREG) 12.5 MG tablet Take 12.5 mg by mouth 2 (two) times daily.     colchicine 0.6 MG tablet Take 0.6 mg by mouth as needed.     doxazosin (CARDURA) 2 MG tablet Take 2 mg by mouth every evening.      levothyroxine (SYNTHROID, LEVOTHROID) 25 MCG tablet Take 25 mcg by mouth daily before breakfast.     losartan (COZAAR) 100 MG tablet Take 1 tablet (100 mg total) by mouth daily. 60 tablet 3   metFORMIN (GLUCOPHAGE) 500 MG tablet Take 500 mg by mouth 2 (two) times daily with a meal.     Multiple Vitamin (MULTIVITAMIN ADULT PO) Take 1 Scoop by mouth daily.     omeprazole (PRILOSEC) 20 MG capsule TAKE 1 CAPSULE DAILY AS NEEDED (HEART BURN) (Patient taking differently: Take 20 mg by mouth daily as needed (heart burn).) 30 capsule 0   polyethylene glycol powder (GLYCOLAX/MIRALAX) powder Mix 17gm in 4oz of water.  Start with 2 doses per day, but may take up to 6 doses per day.  Titrate number of doses to allow 2-3 soft bowel movements daily. (Patient taking differently: Take 17 g by mouth daily.) 850 g 1   rosuvastatin (CRESTOR) 10 MG tablet TAKE 1 TABLET DAILY 90 tablet 3   rosuvastatin (CRESTOR) 5 MG tablet Take  5 mg by mouth daily.     spironolactone (ALDACTONE) 25 MG tablet TAKE 1 TABLET DAILY 90 tablet 3   No current facility-administered medications on file prior to visit.    Cardiovascular studies:  EKG 12/20/2020: Atrial fibrillation 62 bpm  Low voltage in precordial leads  Carotid artery duplex 08/06/2020:  Stenosis in the right internal carotid artery (50-69%), upper end of the  spectrum.  Stenosis in the left internal carotid artery (50-69%), upper end of the  spectrum.  Antegrade right vertebral artery flow. Antegrade left vertebral artery  flow.  Follow up in six months is appropriate if clinically indicated. Compared  to 01/31/2020, no significant change.  EKG 06/20/2020: Atrial fibrillation 61 bpm Nonspecific T-abnormality  Echocardiogram 06/08/2018:  1. The left ventricle has normal systolic function with an ejection fraction of 60-65%. The cavity size was normal. There is mildly increased left ventricular wall thickness. Left ventricular diastolic function could not be evaluated secondary to atrial  fibrillation.  2. The right ventricle has normal systolic function. The cavity was normal. There is no increase in right ventricular wall thickness.  3. Left atrial size was severely dilated.  4. Right atrial size was mildly dilated.  5. The aortic root and ascending aorta are normal in size and structure.  6. The interatrial septum was  not assessed.   Recent labs: 11/20/2020: Glucose 134, BUN/Cr 27/1.0. EGFR 55. Na/K 139/4.7. Rest of the CMP normal H/H 11.7/35.9. MCV 85.9. Platelets 231 Chol 125, TG 91, HDL 47, LDL 61  06/05/2020: Glucose 99, BUN/Cr 27/0.91. EGFR >60. Na/K 140/4.8. Rest of the CMP normal H/H 10.9/33.5. MCV 85. Platelets 242 HbA1C 7.1%  11/14/2019: Glucose 136, BUN/Cr 20/0.93. EGFR 56. Na/K 142/4.7. Calcium 10.5. Rest of the CMP normal H/H 12.8/40.7. MCV 86. Platelets 213 HbA1C 7.3% Chol 129, TG 100, HDL 51, LDL 59 TSH 3.0 normal  08/07/2019: Glucose  169, BUN/Cr 20/0.9. eGFR 54. HbA1C 7.5% Chol 123, TG 84, HDL 49, LDL 155 TSH 3.3 normal   Review of Systems  HENT:         Facial tumor  Cardiovascular:  Negative for chest pain, dyspnea on exertion, leg swelling, palpitations and syncope.       Body mass index is 30.65 kg/m.   Vitals:   12/20/20 1122  BP: (!) 129/58  Pulse: 69  Temp: (!) 97.2 F (36.2 C)  SpO2: 97%    Objective:    Physical Exam Vitals and nursing note reviewed.  Constitutional:      General: She is not in acute distress. HENT:     Head:     Comments: Left facial tumor    Mouth/Throat:     Comments: Minimal swelling left face.  Healing well post surgery. Neck:     Vascular: No JVD.  Cardiovascular:     Rate and Rhythm: Normal rate. Rhythm irregular.     Pulses: Intact distal pulses.     Heart sounds: No murmur heard. Pulmonary:     Effort: Pulmonary effort is normal.     Breath sounds: Normal breath sounds. No wheezing or rales.  Musculoskeletal:     Right lower leg: No edema.     Left lower leg: No edema.        Assessment & Recommendations:   87 y.o. African-American female with hypertension, type 2 diabetes mellitus, hyperlipidemia, iron deficiency anemia, recent Lt ACA stroke 05/2018, persistent Afib, mod b/l carotid stenoses, s/p left facial surgery (05/2020), now with recurrence of tumor  Preop risk stratification: Unfortunately, her facial tumor is back.  She needs a biopsy, and potentially for the surgery. Overall, low cardiac risk for any surgery.  However, her stroke risk from A. fib is high given her CHA2DS2VASc score 7.  Therefore, I recommend bridging of her anticoagulation, as follows  Surgery planned for 01/06/2021. Take last dose of Eliquis on 01/03/2021 evening. Take Lovenox 80 mg twice daily on 10/15, and 10/16. Do not take Lovenox on morning of 10/17. Resume Eliquis on 10/17 evening, unless otherwise specified by the surgeons.  In that case, continue Lovenox until  resumption of Eliquis.  Persistent atrial fibrillation Reasonable to continue rate control strategy. Continue coreg 12.5 mg bid.  CHA2DS2VASc score 7, annual stroke risk 11% Continue eliquis 5 mg bid  Hyperlipidemia: Lipids well controlled on rosuvastatin 10 mg  Hypertension Well controlled.   H/o stroke Stroke in [place] ACA. Minimal neurodeficit.  Continue Crestor to 10 mg. LDL 59. Not on Aspirin due to ongoing use of eliquis given Afib. Continue f/u w/Neurology.  Carotid stenosis, asymptomatic, bilateral: Moderate. Repeat carotid US in 05/2021  Type 2 DM: Follow up with PCP.  F/u in 6 months  Amily Depp Esther Hardy, MD [facility] Pager: [phone] Office: [phone] If no answer Cell [phone]

## 2020-12-25 ENCOUNTER — Other Ambulatory Visit: Payer: Self-pay | Admitting: Hematology and Oncology

## 2020-12-25 ENCOUNTER — Inpatient Hospital Stay: Payer: Medicare Other | Admitting: Hematology and Oncology

## 2020-12-25 ENCOUNTER — Inpatient Hospital Stay: Payer: Medicare Other | Attending: Hematology and Oncology

## 2020-12-25 DIAGNOSIS — D479 Neoplasm of uncertain behavior of lymphoid, hematopoietic and related tissue, unspecified: Secondary | ICD-10-CM

## 2021-01-02 DIAGNOSIS — Z8673 Personal history of transient ischemic attack (TIA), and cerebral infarction without residual deficits: Secondary | ICD-10-CM | POA: Diagnosis not present

## 2021-01-02 DIAGNOSIS — Z1151 Encounter for screening for human papillomavirus (HPV): Secondary | ICD-10-CM | POA: Diagnosis not present

## 2021-01-02 DIAGNOSIS — D479 Neoplasm of uncertain behavior of lymphoid, hematopoietic and related tissue, unspecified: Secondary | ICD-10-CM | POA: Diagnosis not present

## 2021-01-02 DIAGNOSIS — Z7901 Long term (current) use of anticoagulants: Secondary | ICD-10-CM | POA: Diagnosis not present

## 2021-01-02 DIAGNOSIS — C6962 Malignant neoplasm of left orbit: Secondary | ICD-10-CM | POA: Diagnosis not present

## 2021-01-02 DIAGNOSIS — C8300 Small cell B-cell lymphoma, unspecified site: Secondary | ICD-10-CM | POA: Diagnosis not present

## 2021-01-02 DIAGNOSIS — I4891 Unspecified atrial fibrillation: Secondary | ICD-10-CM | POA: Diagnosis not present

## 2021-01-16 DIAGNOSIS — I4891 Unspecified atrial fibrillation: Secondary | ICD-10-CM | POA: Diagnosis not present

## 2021-01-16 DIAGNOSIS — K219 Gastro-esophageal reflux disease without esophagitis: Secondary | ICD-10-CM | POA: Diagnosis not present

## 2021-01-16 DIAGNOSIS — C6962 Malignant neoplasm of left orbit: Secondary | ICD-10-CM | POA: Diagnosis not present

## 2021-01-16 DIAGNOSIS — C8300 Small cell B-cell lymphoma, unspecified site: Secondary | ICD-10-CM | POA: Diagnosis not present

## 2021-01-16 DIAGNOSIS — I10 Essential (primary) hypertension: Secondary | ICD-10-CM | POA: Diagnosis not present

## 2021-01-16 DIAGNOSIS — D479 Neoplasm of uncertain behavior of lymphoid, hematopoietic and related tissue, unspecified: Secondary | ICD-10-CM | POA: Diagnosis not present

## 2021-01-16 DIAGNOSIS — Z8673 Personal history of transient ischemic attack (TIA), and cerebral infarction without residual deficits: Secondary | ICD-10-CM | POA: Diagnosis not present

## 2021-01-16 DIAGNOSIS — E1136 Type 2 diabetes mellitus with diabetic cataract: Secondary | ICD-10-CM | POA: Diagnosis not present

## 2021-01-16 DIAGNOSIS — E785 Hyperlipidemia, unspecified: Secondary | ICD-10-CM | POA: Diagnosis not present

## 2021-01-16 DIAGNOSIS — R531 Weakness: Secondary | ICD-10-CM | POA: Diagnosis not present

## 2021-01-16 DIAGNOSIS — C8510 Unspecified B-cell lymphoma, unspecified site: Secondary | ICD-10-CM | POA: Diagnosis not present

## 2021-01-16 DIAGNOSIS — Z23 Encounter for immunization: Secondary | ICD-10-CM | POA: Diagnosis not present

## 2021-01-21 DIAGNOSIS — Z5112 Encounter for antineoplastic immunotherapy: Secondary | ICD-10-CM | POA: Diagnosis not present

## 2021-01-21 DIAGNOSIS — C8510 Unspecified B-cell lymphoma, unspecified site: Secondary | ICD-10-CM | POA: Diagnosis not present

## 2021-01-28 DIAGNOSIS — C8510 Unspecified B-cell lymphoma, unspecified site: Secondary | ICD-10-CM | POA: Diagnosis not present

## 2021-01-28 DIAGNOSIS — Z5112 Encounter for antineoplastic immunotherapy: Secondary | ICD-10-CM | POA: Diagnosis not present

## 2021-02-04 DIAGNOSIS — C8510 Unspecified B-cell lymphoma, unspecified site: Secondary | ICD-10-CM | POA: Diagnosis not present

## 2021-02-04 DIAGNOSIS — Z7984 Long term (current) use of oral hypoglycemic drugs: Secondary | ICD-10-CM | POA: Diagnosis not present

## 2021-02-04 DIAGNOSIS — E785 Hyperlipidemia, unspecified: Secondary | ICD-10-CM | POA: Diagnosis not present

## 2021-02-04 DIAGNOSIS — M109 Gout, unspecified: Secondary | ICD-10-CM | POA: Diagnosis not present

## 2021-02-04 DIAGNOSIS — Z8673 Personal history of transient ischemic attack (TIA), and cerebral infarction without residual deficits: Secondary | ICD-10-CM | POA: Diagnosis not present

## 2021-02-04 DIAGNOSIS — E119 Type 2 diabetes mellitus without complications: Secondary | ICD-10-CM | POA: Diagnosis not present

## 2021-02-04 DIAGNOSIS — I6523 Occlusion and stenosis of bilateral carotid arteries: Secondary | ICD-10-CM | POA: Diagnosis not present

## 2021-02-04 DIAGNOSIS — K219 Gastro-esophageal reflux disease without esophagitis: Secondary | ICD-10-CM | POA: Diagnosis not present

## 2021-02-04 DIAGNOSIS — Z7901 Long term (current) use of anticoagulants: Secondary | ICD-10-CM | POA: Diagnosis not present

## 2021-02-04 DIAGNOSIS — I4891 Unspecified atrial fibrillation: Secondary | ICD-10-CM | POA: Diagnosis not present

## 2021-02-04 DIAGNOSIS — I10 Essential (primary) hypertension: Secondary | ICD-10-CM | POA: Diagnosis not present

## 2021-02-04 DIAGNOSIS — Z5112 Encounter for antineoplastic immunotherapy: Secondary | ICD-10-CM | POA: Diagnosis not present

## 2021-02-11 DIAGNOSIS — E1129 Type 2 diabetes mellitus with other diabetic kidney complication: Secondary | ICD-10-CM | POA: Diagnosis not present

## 2021-02-11 DIAGNOSIS — E119 Type 2 diabetes mellitus without complications: Secondary | ICD-10-CM | POA: Diagnosis not present

## 2021-02-11 DIAGNOSIS — Z79899 Other long term (current) drug therapy: Secondary | ICD-10-CM | POA: Diagnosis not present

## 2021-02-11 DIAGNOSIS — E039 Hypothyroidism, unspecified: Secondary | ICD-10-CM | POA: Diagnosis not present

## 2021-02-11 DIAGNOSIS — C8510 Unspecified B-cell lymphoma, unspecified site: Secondary | ICD-10-CM | POA: Diagnosis not present

## 2021-02-11 DIAGNOSIS — E1169 Type 2 diabetes mellitus with other specified complication: Secondary | ICD-10-CM | POA: Diagnosis not present

## 2021-02-11 DIAGNOSIS — E7800 Pure hypercholesterolemia, unspecified: Secondary | ICD-10-CM | POA: Diagnosis not present

## 2021-02-11 DIAGNOSIS — I10 Essential (primary) hypertension: Secondary | ICD-10-CM | POA: Diagnosis not present

## 2021-02-11 DIAGNOSIS — Z5112 Encounter for antineoplastic immunotherapy: Secondary | ICD-10-CM | POA: Diagnosis not present

## 2021-02-11 DIAGNOSIS — I480 Paroxysmal atrial fibrillation: Secondary | ICD-10-CM | POA: Diagnosis not present

## 2021-02-18 DIAGNOSIS — H6122 Impacted cerumen, left ear: Secondary | ICD-10-CM | POA: Diagnosis not present

## 2021-03-03 DIAGNOSIS — E119 Type 2 diabetes mellitus without complications: Secondary | ICD-10-CM | POA: Diagnosis not present

## 2021-03-03 DIAGNOSIS — H40023 Open angle with borderline findings, high risk, bilateral: Secondary | ICD-10-CM | POA: Diagnosis not present

## 2021-03-04 DIAGNOSIS — C8599 Non-Hodgkin lymphoma, unspecified, extranodal and solid organ sites: Secondary | ICD-10-CM | POA: Diagnosis not present

## 2021-03-04 DIAGNOSIS — C8510 Unspecified B-cell lymphoma, unspecified site: Secondary | ICD-10-CM | POA: Diagnosis not present

## 2021-03-06 DIAGNOSIS — I10 Essential (primary) hypertension: Secondary | ICD-10-CM | POA: Diagnosis not present

## 2021-03-06 DIAGNOSIS — I2510 Atherosclerotic heart disease of native coronary artery without angina pectoris: Secondary | ICD-10-CM | POA: Diagnosis not present

## 2021-03-06 DIAGNOSIS — I639 Cerebral infarction, unspecified: Secondary | ICD-10-CM | POA: Diagnosis not present

## 2021-03-06 DIAGNOSIS — E039 Hypothyroidism, unspecified: Secondary | ICD-10-CM | POA: Diagnosis not present

## 2021-03-06 DIAGNOSIS — E1169 Type 2 diabetes mellitus with other specified complication: Secondary | ICD-10-CM | POA: Diagnosis not present

## 2021-03-06 DIAGNOSIS — I700 Atherosclerosis of aorta: Secondary | ICD-10-CM | POA: Diagnosis not present

## 2021-03-06 DIAGNOSIS — R220 Localized swelling, mass and lump, head: Secondary | ICD-10-CM | POA: Diagnosis not present

## 2021-03-06 DIAGNOSIS — I480 Paroxysmal atrial fibrillation: Secondary | ICD-10-CM | POA: Diagnosis not present

## 2021-03-06 DIAGNOSIS — C8510 Unspecified B-cell lymphoma, unspecified site: Secondary | ICD-10-CM | POA: Diagnosis not present

## 2021-03-06 DIAGNOSIS — Z7984 Long term (current) use of oral hypoglycemic drugs: Secondary | ICD-10-CM | POA: Diagnosis not present

## 2021-03-06 DIAGNOSIS — E7800 Pure hypercholesterolemia, unspecified: Secondary | ICD-10-CM | POA: Diagnosis not present

## 2021-03-06 DIAGNOSIS — D6869 Other thrombophilia: Secondary | ICD-10-CM | POA: Diagnosis not present

## 2021-04-09 DIAGNOSIS — M25531 Pain in right wrist: Secondary | ICD-10-CM | POA: Diagnosis not present

## 2021-04-15 DIAGNOSIS — M25531 Pain in right wrist: Secondary | ICD-10-CM | POA: Diagnosis not present

## 2021-04-17 ENCOUNTER — Other Ambulatory Visit: Payer: Self-pay

## 2021-04-17 ENCOUNTER — Other Ambulatory Visit: Payer: Self-pay | Admitting: Sports Medicine

## 2021-04-17 ENCOUNTER — Ambulatory Visit
Admission: RE | Admit: 2021-04-17 | Discharge: 2021-04-17 | Disposition: A | Discharge status: Home / Self Care | Payer: Medicare Other | Source: Ambulatory Visit | Attending: Sports Medicine | Admitting: Sports Medicine

## 2021-04-17 DIAGNOSIS — M25531 Pain in right wrist: Secondary | ICD-10-CM | POA: Diagnosis not present

## 2021-05-13 DIAGNOSIS — C8510 Unspecified B-cell lymphoma, unspecified site: Secondary | ICD-10-CM | POA: Diagnosis not present

## 2021-06-12 ENCOUNTER — Ambulatory Visit: Payer: Medicare Other

## 2021-06-12 ENCOUNTER — Other Ambulatory Visit: Payer: Self-pay

## 2021-06-12 DIAGNOSIS — I4819 Other persistent atrial fibrillation: Secondary | ICD-10-CM | POA: Diagnosis not present

## 2021-06-12 DIAGNOSIS — Z09 Encounter for follow-up examination after completed treatment for conditions other than malignant neoplasm: Secondary | ICD-10-CM | POA: Diagnosis not present

## 2021-06-20 ENCOUNTER — Ambulatory Visit: Payer: Medicare Other | Admitting: Cardiology

## 2021-06-20 ENCOUNTER — Encounter: Payer: Self-pay | Admitting: Cardiology

## 2021-06-20 VITALS — BP 129/63 | HR 60 | Temp 97.7°F | Resp 16 | Ht 63.0 in | Wt 183.0 lb

## 2021-06-20 DIAGNOSIS — E782 Mixed hyperlipidemia: Secondary | ICD-10-CM | POA: Diagnosis not present

## 2021-06-20 DIAGNOSIS — E119 Type 2 diabetes mellitus without complications: Secondary | ICD-10-CM | POA: Diagnosis not present

## 2021-06-20 DIAGNOSIS — I6523 Occlusion and stenosis of bilateral carotid arteries: Secondary | ICD-10-CM

## 2021-06-20 DIAGNOSIS — Z8673 Personal history of transient ischemic attack (TIA), and cerebral infarction without residual deficits: Secondary | ICD-10-CM | POA: Diagnosis not present

## 2021-06-20 DIAGNOSIS — I10 Essential (primary) hypertension: Secondary | ICD-10-CM

## 2021-06-20 DIAGNOSIS — I4819 Other persistent atrial fibrillation: Secondary | ICD-10-CM

## 2021-06-20 NOTE — Progress Notes (Signed)
? ? ?Subjective:  ? ?Haley Wilcox, female    DOB: 1931-09-21, 88 y.o.   MRN: [id number] ? ? ?Chief complaint:  ?Atrial fibrillation  ? ?HPI ? ? ?88 y.o. African-American female with hypertension, type 2 diabetes mellitus, hyperlipidemia, iron deficiency anemia, recent Lt ACA stroke 05/2018, persistent Afib, mod b/l carotid stenoses. ? ?Patient is doing well. 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 She has regular follow up with [facility] for the same. From cardiac standpoint, she has no complaints.  ? ? ?Current Outpatient Medications:  ?  amLODipine (NORVASC) 10 MG tablet, TAKE 1 TABLET DAILY (DOSE INCREASED), Disp: 90 tablet, Rfl: 3 ?  apixaban (ELIQUIS) 5 MG TABS tablet, Take 1 tablet (5 mg total) by mouth 2 (two) times daily., Disp: 60 tablet, Rfl: 2 ?  carvedilol (COREG) 12.5 MG tablet, Take 12.5 mg by mouth 2 (two) times daily., Disp: , Rfl:  ?  doxazosin (CARDURA) 2 MG tablet, Take 2 mg by mouth every evening. , Disp: , Rfl:  ?  enoxaparin (LOVENOX) 80 MG/0.8ML injection, Inject 0.8 mLs (80 mg total) into the skin every 12 (twelve) hours., Disp: 6 mL, Rfl: 1 ?  levothyroxine (SYNTHROID, LEVOTHROID) 25 MCG tablet, Take 25 mcg by mouth daily before breakfast., Disp: , Rfl:  ?  losartan (COZAAR) 100 MG tablet, Take 1 tablet (100 mg total) by mouth daily., Disp: 60 tablet, Rfl: 3 ?  metFORMIN (GLUCOPHAGE) 500 MG tablet, Take 500 mg by mouth 2 (two) times daily with a meal., Disp: , Rfl:  ?  Multiple Vitamin (MULTIVITAMIN ADULT PO), Take 1 Scoop by mouth daily., Disp: , Rfl:  ?  omeprazole (PRILOSEC) 20 MG capsule, TAKE 1 CAPSULE DAILY AS NEEDED (HEART BURN) (Patient taking differently: Take 20 mg by mouth daily as needed (heart burn).), Disp: 30 capsule, Rfl: 0 ?  polyethylene glycol powder (GLYCOLAX/MIRALAX) powder, Mix 17gm in 4oz of water.  Start with 2 doses per day, but may take up to 6 doses per  day.  Titrate number of doses to allow 2-3 soft bowel movements daily. (Patient taking differently: Take 17 g by mouth daily.), Disp: 850 g, Rfl: 1 ?  rosuvastatin (CRESTOR) 10 MG tablet, TAKE 1 TABLET DAILY, Disp: 90 tablet, Rfl: 3 ?  spironolactone (ALDACTONE) 25 MG tablet, TAKE 1 TABLET DAILY, Disp: 90 tablet, Rfl: 3 ?  allopurinol (ZYLOPRIM) 100 MG tablet, Take 100 mg by mouth daily., Disp: , Rfl:  ? ?Cardiovascular studies: ? ?EKG 12/20/2020: ?Atrial fibrillation 62 bpm  ?Low voltage in precordial leads ? ?Carotid artery duplex 06/12/2021:  ?Duplex suggests stenosis in the right internal carotid artery (50-69%).  ?Duplex suggests stenosis in the left internal carotid artery (50-69%).  ?Antegrade right vertebral artery flow. Antegrade left vertebral artery  ?flow.  ?No significant change from 08/06/2020. Follow up in six months is  ?appropriate if clinically indicated. ? ?Echocardiogram 06/08/2018: ? 1. The left ventricle has normal systolic function with an ejection fraction of 60-65%. The cavity size was normal. There is mildly increased left ventricular wall thickness. Left ventricular diastolic function could not be evaluated secondary to atrial ? fibrillation. ? 2. The right ventricle has normal systolic function. The cavity was normal. There is no increase in right ventricular wall thickness. ? 3. Left atrial size was severely dilated. ? 4. Right atrial size was mildly dilated. ? 5. The aortic root and ascending aorta are normal in  size and structure. ? 6. The interatrial septum was not assessed. ? ? ?Recent labs: ?11/20/2020: ?Glucose 134, BUN/Cr 27/1.0. EGFR 55. Na/K 139/4.7. Rest of the CMP normal ?H/H 11.7/35.9. MCV 85.9. Platelets 231 ?Chol 125, TG 91, HDL 47, LDL 61 ? ?06/05/2020: ?Glucose 99, BUN/Cr 27/0.91. EGFR >60. Na/K 140/4.8. Rest of the CMP normal ?H/H 10.9/33.5. MCV 85. Platelets 242 ?HbA1C 7.1% ? ?11/14/2019: ?Glucose 136, BUN/Cr 20/0.93. EGFR 56. Na/K 142/4.7. Calcium 10.5. Rest of the CMP  normal ?H/H 12.8/40.7. MCV 86. Platelets 213 ?HbA1C 7.3% ?Chol 129, TG 100, HDL 51, LDL 59 ?TSH 3.0 normal ? ?08/07/2019: ?Glucose 169, BUN/Cr 20/0.9. eGFR 54. ?HbA1C 7.5% ?Chol 123, TG 84, HDL 49, LDL 155 ?TSH 3.3 normal ? ? ?Review of Systems  ?HENT:    ?     Facial tumor  ?Cardiovascular:  Negative for chest pain, dyspnea on exertion, leg swelling, palpitations and syncope.  ? ?   ? ?Body mass index is 32.42 kg/m?. ? ? ?Vitals:  ? 06/20/21 0930  ?BP: 129/63  ?Pulse: 60  ?Resp: 16  ?Temp: 97.7 ?F (36.5 ?C)  ?SpO2: 99%  ? ? ? ?Objective:  ?  ?Physical Exam ?Vitals and nursing note reviewed.  ?Constitutional:   ?   General: She is not in acute distress. ?HENT:  ?   Head:  ?   Comments: Decrease in left maxilla swelling compared to 11/2020 ?Neck:  ?   Vascular: No JVD.  ?Cardiovascular:  ?   Rate and Rhythm: Normal rate. Rhythm irregular.  ?   Pulses: Intact distal pulses.  ?   Heart sounds: No murmur heard. ?Pulmonary:  ?   Effort: Pulmonary effort is normal.  ?   Breath sounds: Normal breath sounds. No wheezing or rales.  ?Musculoskeletal:  ?   Right lower leg: No edema.  ?   Left lower leg: No edema.  ? ?  ICD-10-CM   ?1. Persistent atrial fibrillation (HCC)  I48.19   ?  ?2. Type 2 diabetes mellitus without complication, without long-term current use of insulin (HCC)  E11.9   ?  ?3. Essential hypertension  I10   ?  ?4. Mixed hyperlipidemia  E78.2   ?  ?5. H/O: stroke  Z86.73   ?  ? ? ?Orders Placed This Encounter  ?Procedures  ? PCV CAROTID DUPLEX (BILATERAL)  ? ? ?   ?Assessment & Recommendations:  ? ?88 y.o. 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 ?Persistent atrial fibrillation ?Reasonable to continue rate control strategy. Continue coreg 12.5 mg bid.  ?CHA2DS2VASc score 7, annual  stroke risk 11% ?Continue eliquis 5 mg bid ? ?Hyperlipidemia: ?Lipids well controlled on rosuvastatin 10 mg ? ?Hypertension ?Well controlled.  ? ?H/o stroke ?Stroke in [place]. Minimal neurodeficit.  ?Continue Crestor to 10 mg. LDL 59. ?Not on Aspirin due to ongoing use of eliquis given Afib. ?Continue f/u w/Neurology. ? ?Carotid stenosis, asymptomatic, bilateral: ?Moderate. Stable. ?Will check back in 1 year ? ?Type 2 DM: ?Follow up with PCP. ? ?F/u in 1 year ? ?Nigel Mormon, MD ?[facility] ?Pager: [phone] ?Office: [phone] ?If no answer Cell [phone] ?   ? ?

## 2021-06-24 ENCOUNTER — Other Ambulatory Visit: Payer: Self-pay | Admitting: Cardiology

## 2021-06-24 DIAGNOSIS — E782 Mixed hyperlipidemia: Secondary | ICD-10-CM

## 2021-06-26 DIAGNOSIS — Z8701 Personal history of pneumonia (recurrent): Secondary | ICD-10-CM | POA: Diagnosis not present

## 2021-06-26 DIAGNOSIS — I10 Essential (primary) hypertension: Secondary | ICD-10-CM | POA: Diagnosis not present

## 2021-06-26 DIAGNOSIS — E039 Hypothyroidism, unspecified: Secondary | ICD-10-CM | POA: Diagnosis not present

## 2021-06-26 DIAGNOSIS — I4891 Unspecified atrial fibrillation: Secondary | ICD-10-CM | POA: Diagnosis not present

## 2021-06-26 DIAGNOSIS — Z8673 Personal history of transient ischemic attack (TIA), and cerebral infarction without residual deficits: Secondary | ICD-10-CM | POA: Diagnosis not present

## 2021-06-26 DIAGNOSIS — R930 Abnormal findings on diagnostic imaging of skull and head, not elsewhere classified: Secondary | ICD-10-CM | POA: Diagnosis not present

## 2021-06-26 DIAGNOSIS — Z7901 Long term (current) use of anticoagulants: Secondary | ICD-10-CM | POA: Diagnosis not present

## 2021-06-26 DIAGNOSIS — E119 Type 2 diabetes mellitus without complications: Secondary | ICD-10-CM | POA: Diagnosis not present

## 2021-06-26 DIAGNOSIS — E785 Hyperlipidemia, unspecified: Secondary | ICD-10-CM | POA: Diagnosis not present

## 2021-06-26 DIAGNOSIS — C6962 Malignant neoplasm of left orbit: Secondary | ICD-10-CM | POA: Diagnosis not present

## 2021-06-26 DIAGNOSIS — C8519 Unspecified B-cell lymphoma, extranodal and solid organ sites: Secondary | ICD-10-CM | POA: Diagnosis not present

## 2021-06-26 DIAGNOSIS — K219 Gastro-esophageal reflux disease without esophagitis: Secondary | ICD-10-CM | POA: Diagnosis not present

## 2021-06-26 DIAGNOSIS — C8599 Non-Hodgkin lymphoma, unspecified, extranodal and solid organ sites: Secondary | ICD-10-CM | POA: Diagnosis not present

## 2021-06-30 DIAGNOSIS — H40023 Open angle with borderline findings, high risk, bilateral: Secondary | ICD-10-CM | POA: Diagnosis not present

## 2021-07-31 IMAGING — CT CT MAXILLOFACIAL W/ CM
3 series · 9 of 16 positions shown, 10 images · IV contrast (OMNIPAQUE)
Comparison: CT neck 01/19/2020

CLINICAL DATA: Left maxillary mass found on dental exam.

13 hour prep for contrast allergy.
EXAM:
CT MAXILLOFACIAL WITH CONTRAST
TECHNIQUE: Multidetector CT imaging of the maxillofacial structures was
performed with intravenous contrast. Multiplanar CT image
reconstructions were also generated.
CONTRAST:  100mL OMNIPAQUE IOHEXOL 300 MG/ML  SOLN

[Series 4: coronal soft · coronal · 0.39mm/px · 3 of 80 slices shown]
[im 16/80  bone]
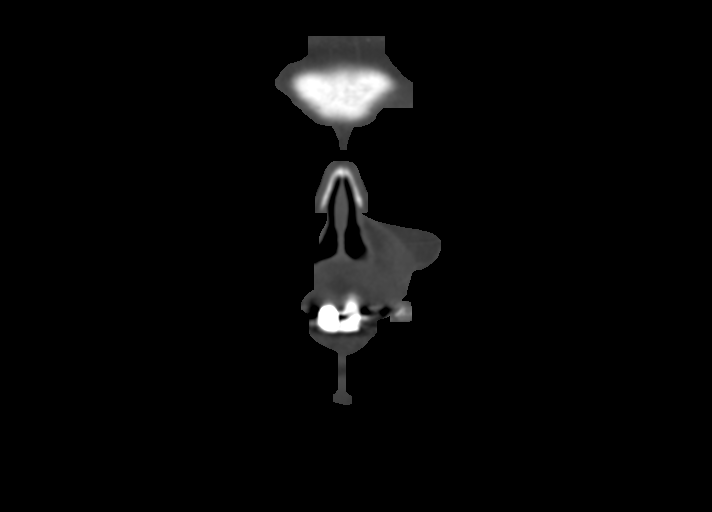
[im 32/80  bone]
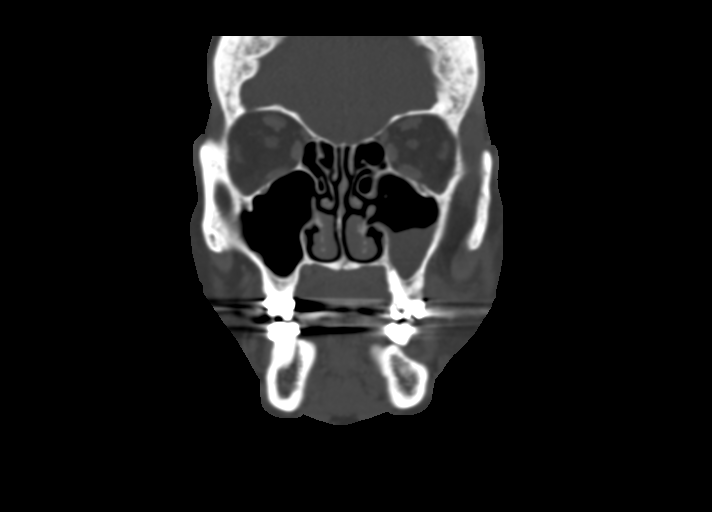
[im 48/80  bone]
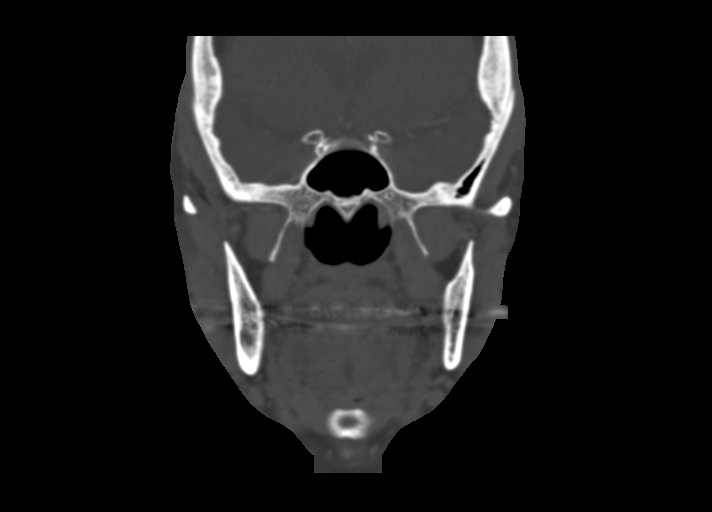

[Series 5: sagittal soft · sagittal · 0.38mm/px · 3 of 86 slices shown]
[im 22/86  bone]
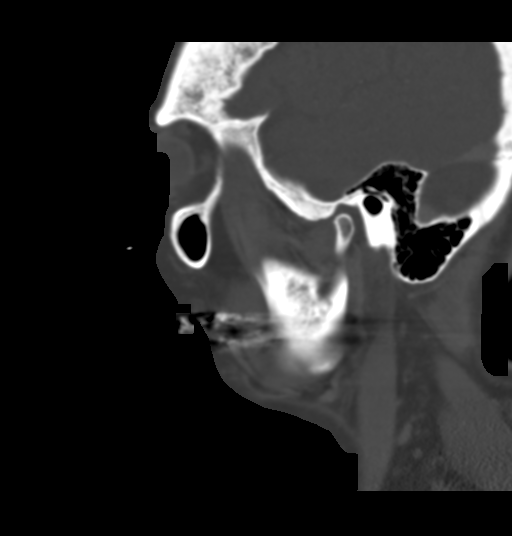
[im 43/86  bone]
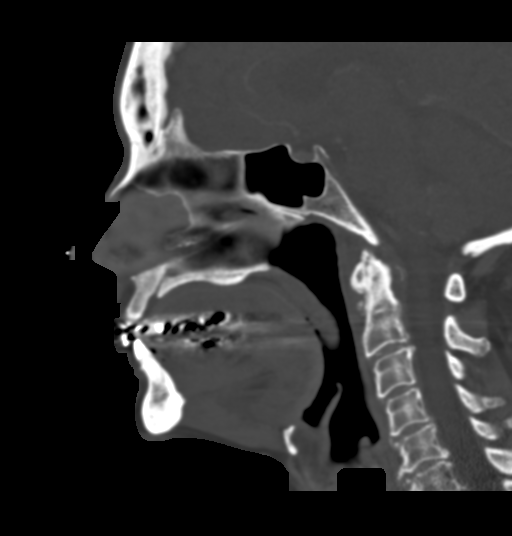
[im 64/86  bone]
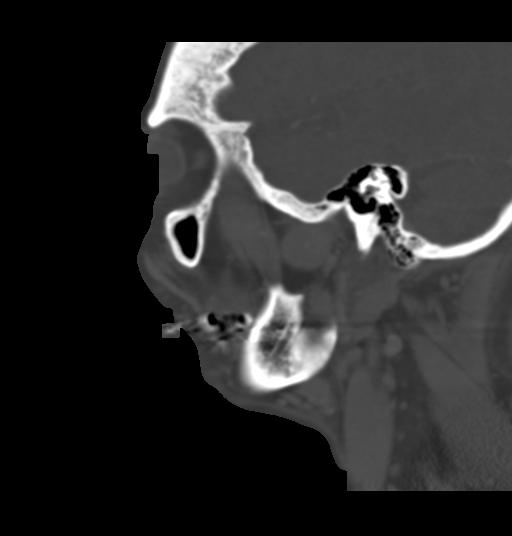

[Series 10: max soft · axial · 0.33mm/px · z∈[-124,-40]mm · 3 of 85 slices shown, 4 images]
[im 22/85  soft-tissue]
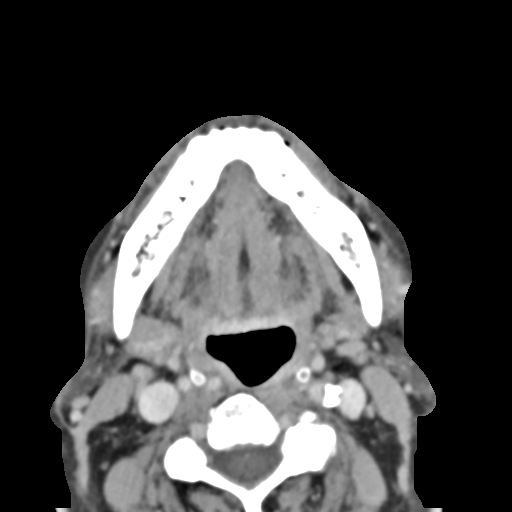
[im 22/85  bone]
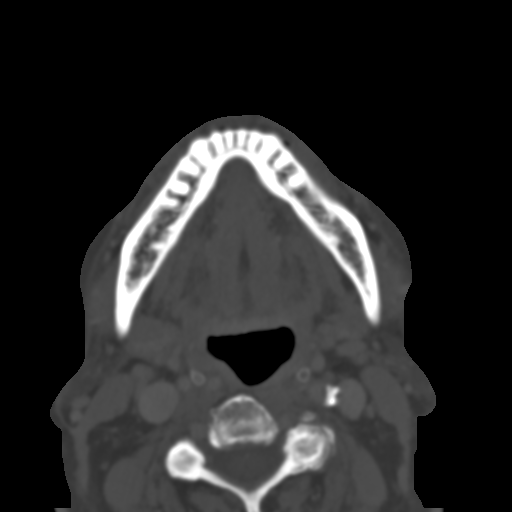
[im 43/85  bone]
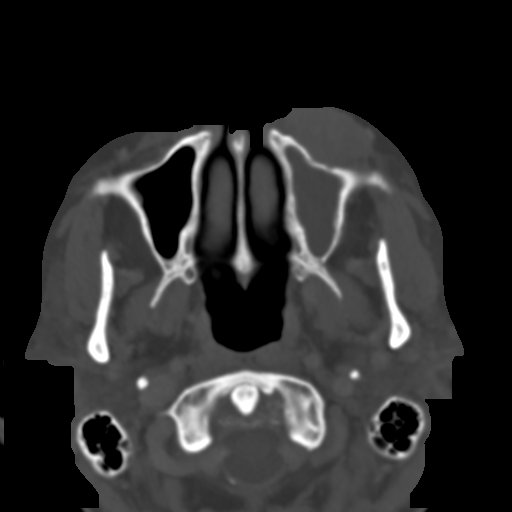
[im 64/85  bone]
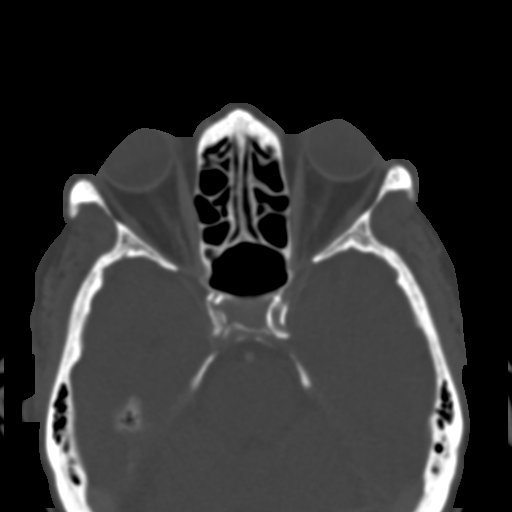

[9 of 16 positions shown; findings below may reference images not displayed]

FINDINGS: Osseous: No acute skeletal abnormality.

Orbits: Bilateral cataract extraction.  No orbital mass or edema.

Sinuses: Moderate mucosal edema left maxillary sinus. Remaining
sinuses clear.

Soft tissues: Soft tissue mass anterior to the left maxillary sinus
appears to be enhancing and measures approximately 17 x 31 mm. This
has ill-defined margins. Probable neoplasm. No bony destruction. The
mass extends to the bone.

Limited intracranial: Negative
IMPRESSION: Enhancing soft tissue mass anterior to the left maxillary sinus. No
bone destruction. Probable neoplasm recommend biopsy. There is
moderate mucosal edema left maxillary sinus of lower density than
the mass. No adenopathy in the upper neck.

## 2021-08-04 DIAGNOSIS — E7800 Pure hypercholesterolemia, unspecified: Secondary | ICD-10-CM | POA: Diagnosis not present

## 2021-08-04 DIAGNOSIS — E1169 Type 2 diabetes mellitus with other specified complication: Secondary | ICD-10-CM | POA: Diagnosis not present

## 2021-08-04 DIAGNOSIS — E039 Hypothyroidism, unspecified: Secondary | ICD-10-CM | POA: Diagnosis not present

## 2021-08-04 DIAGNOSIS — I10 Essential (primary) hypertension: Secondary | ICD-10-CM | POA: Diagnosis not present

## 2021-08-26 ENCOUNTER — Other Ambulatory Visit: Payer: Self-pay

## 2021-08-26 DIAGNOSIS — I1A0 Resistant hypertension: Secondary | ICD-10-CM

## 2021-08-26 DIAGNOSIS — I10 Essential (primary) hypertension: Secondary | ICD-10-CM

## 2021-08-26 MED ORDER — SPIRONOLACTONE 25 MG PO TABS: 25.0000 mg | ORAL_TABLET | Freq: Every day | ORAL | 0 refills | Status: DC

## 2021-09-09 DIAGNOSIS — I700 Atherosclerosis of aorta: Secondary | ICD-10-CM | POA: Diagnosis not present

## 2021-09-09 DIAGNOSIS — I10 Essential (primary) hypertension: Secondary | ICD-10-CM | POA: Diagnosis not present

## 2021-09-09 DIAGNOSIS — C8510 Unspecified B-cell lymphoma, unspecified site: Secondary | ICD-10-CM | POA: Diagnosis not present

## 2021-09-09 DIAGNOSIS — E7800 Pure hypercholesterolemia, unspecified: Secondary | ICD-10-CM | POA: Diagnosis not present

## 2021-09-09 DIAGNOSIS — I2510 Atherosclerotic heart disease of native coronary artery without angina pectoris: Secondary | ICD-10-CM | POA: Diagnosis not present

## 2021-09-09 DIAGNOSIS — I6523 Occlusion and stenosis of bilateral carotid arteries: Secondary | ICD-10-CM | POA: Diagnosis not present

## 2021-09-09 DIAGNOSIS — Z0000 Encounter for general adult medical examination without abnormal findings: Secondary | ICD-10-CM | POA: Diagnosis not present

## 2021-09-09 DIAGNOSIS — Z1331 Encounter for screening for depression: Secondary | ICD-10-CM | POA: Diagnosis not present

## 2021-09-09 DIAGNOSIS — E1169 Type 2 diabetes mellitus with other specified complication: Secondary | ICD-10-CM | POA: Diagnosis not present

## 2021-09-15 DIAGNOSIS — M79604 Pain in right leg: Secondary | ICD-10-CM | POA: Diagnosis not present

## 2021-10-29 ENCOUNTER — Encounter (INDEPENDENT_AMBULATORY_CARE_PROVIDER_SITE_OTHER): Payer: Self-pay

## 2021-10-30 DIAGNOSIS — H40023 Open angle with borderline findings, high risk, bilateral: Secondary | ICD-10-CM | POA: Diagnosis not present

## 2021-11-04 DIAGNOSIS — E7800 Pure hypercholesterolemia, unspecified: Secondary | ICD-10-CM | POA: Diagnosis not present

## 2021-11-04 DIAGNOSIS — I10 Essential (primary) hypertension: Secondary | ICD-10-CM | POA: Diagnosis not present

## 2021-11-04 DIAGNOSIS — E039 Hypothyroidism, unspecified: Secondary | ICD-10-CM | POA: Diagnosis not present

## 2021-11-04 DIAGNOSIS — E1169 Type 2 diabetes mellitus with other specified complication: Secondary | ICD-10-CM | POA: Diagnosis not present

## 2021-11-17 ENCOUNTER — Other Ambulatory Visit: Payer: Self-pay | Admitting: Cardiology

## 2021-11-17 DIAGNOSIS — I10 Essential (primary) hypertension: Secondary | ICD-10-CM

## 2021-12-30 DIAGNOSIS — E785 Hyperlipidemia, unspecified: Secondary | ICD-10-CM | POA: Diagnosis not present

## 2021-12-30 DIAGNOSIS — I6523 Occlusion and stenosis of bilateral carotid arteries: Secondary | ICD-10-CM | POA: Diagnosis not present

## 2021-12-30 DIAGNOSIS — Z8673 Personal history of transient ischemic attack (TIA), and cerebral infarction without residual deficits: Secondary | ICD-10-CM | POA: Diagnosis not present

## 2021-12-30 DIAGNOSIS — D479 Neoplasm of uncertain behavior of lymphoid, hematopoietic and related tissue, unspecified: Secondary | ICD-10-CM | POA: Diagnosis not present

## 2021-12-30 DIAGNOSIS — Z23 Encounter for immunization: Secondary | ICD-10-CM | POA: Diagnosis not present

## 2021-12-30 DIAGNOSIS — I10 Essential (primary) hypertension: Secondary | ICD-10-CM | POA: Diagnosis not present

## 2021-12-30 DIAGNOSIS — C760 Malignant neoplasm of head, face and neck: Secondary | ICD-10-CM | POA: Diagnosis not present

## 2021-12-30 DIAGNOSIS — C6992 Malignant neoplasm of unspecified site of left eye: Secondary | ICD-10-CM | POA: Diagnosis not present

## 2021-12-30 DIAGNOSIS — K219 Gastro-esophageal reflux disease without esophagitis: Secondary | ICD-10-CM | POA: Diagnosis not present

## 2021-12-30 DIAGNOSIS — Z7901 Long term (current) use of anticoagulants: Secondary | ICD-10-CM | POA: Diagnosis not present

## 2021-12-30 DIAGNOSIS — C8599 Non-Hodgkin lymphoma, unspecified, extranodal and solid organ sites: Secondary | ICD-10-CM | POA: Diagnosis not present

## 2021-12-30 DIAGNOSIS — C6962 Malignant neoplasm of left orbit: Secondary | ICD-10-CM | POA: Diagnosis not present

## 2021-12-30 DIAGNOSIS — Z79899 Other long term (current) drug therapy: Secondary | ICD-10-CM | POA: Diagnosis not present

## 2022-01-01 DIAGNOSIS — C8309 Small cell B-cell lymphoma, extranodal and solid organ sites: Secondary | ICD-10-CM | POA: Diagnosis not present

## 2022-01-01 DIAGNOSIS — C760 Malignant neoplasm of head, face and neck: Secondary | ICD-10-CM | POA: Diagnosis not present

## 2022-01-07 DIAGNOSIS — C8599 Non-Hodgkin lymphoma, unspecified, extranodal and solid organ sites: Secondary | ICD-10-CM | POA: Diagnosis not present

## 2022-01-07 DIAGNOSIS — C8309 Small cell B-cell lymphoma, extranodal and solid organ sites: Secondary | ICD-10-CM | POA: Diagnosis not present

## 2022-01-08 DIAGNOSIS — C8309 Small cell B-cell lymphoma, extranodal and solid organ sites: Secondary | ICD-10-CM | POA: Diagnosis not present

## 2022-01-08 DIAGNOSIS — C310 Malignant neoplasm of maxillary sinus: Secondary | ICD-10-CM | POA: Diagnosis not present

## 2022-01-14 DIAGNOSIS — C8309 Small cell B-cell lymphoma, extranodal and solid organ sites: Secondary | ICD-10-CM | POA: Diagnosis not present

## 2022-01-15 DIAGNOSIS — C8309 Small cell B-cell lymphoma, extranodal and solid organ sites: Secondary | ICD-10-CM | POA: Diagnosis not present

## 2022-02-11 DIAGNOSIS — I10 Essential (primary) hypertension: Secondary | ICD-10-CM | POA: Diagnosis not present

## 2022-02-11 DIAGNOSIS — I639 Cerebral infarction, unspecified: Secondary | ICD-10-CM | POA: Diagnosis not present

## 2022-02-11 DIAGNOSIS — E039 Hypothyroidism, unspecified: Secondary | ICD-10-CM | POA: Diagnosis not present

## 2022-02-11 DIAGNOSIS — E7800 Pure hypercholesterolemia, unspecified: Secondary | ICD-10-CM | POA: Diagnosis not present

## 2022-02-11 DIAGNOSIS — E1169 Type 2 diabetes mellitus with other specified complication: Secondary | ICD-10-CM | POA: Diagnosis not present

## 2022-02-11 DIAGNOSIS — I480 Paroxysmal atrial fibrillation: Secondary | ICD-10-CM | POA: Diagnosis not present

## 2022-03-06 DIAGNOSIS — E039 Hypothyroidism, unspecified: Secondary | ICD-10-CM | POA: Diagnosis not present

## 2022-03-06 DIAGNOSIS — C8510 Unspecified B-cell lymphoma, unspecified site: Secondary | ICD-10-CM | POA: Diagnosis not present

## 2022-03-06 DIAGNOSIS — D6869 Other thrombophilia: Secondary | ICD-10-CM | POA: Diagnosis not present

## 2022-03-06 DIAGNOSIS — I700 Atherosclerosis of aorta: Secondary | ICD-10-CM | POA: Diagnosis not present

## 2022-03-06 DIAGNOSIS — E7800 Pure hypercholesterolemia, unspecified: Secondary | ICD-10-CM | POA: Diagnosis not present

## 2022-03-06 DIAGNOSIS — I480 Paroxysmal atrial fibrillation: Secondary | ICD-10-CM | POA: Diagnosis not present

## 2022-03-06 DIAGNOSIS — I10 Essential (primary) hypertension: Secondary | ICD-10-CM | POA: Diagnosis not present

## 2022-03-06 DIAGNOSIS — I6523 Occlusion and stenosis of bilateral carotid arteries: Secondary | ICD-10-CM | POA: Diagnosis not present

## 2022-03-06 DIAGNOSIS — E1169 Type 2 diabetes mellitus with other specified complication: Secondary | ICD-10-CM | POA: Diagnosis not present

## 2022-03-06 DIAGNOSIS — I2510 Atherosclerotic heart disease of native coronary artery without angina pectoris: Secondary | ICD-10-CM | POA: Diagnosis not present

## 2022-03-06 DIAGNOSIS — G4700 Insomnia, unspecified: Secondary | ICD-10-CM | POA: Diagnosis not present

## 2022-04-27 DIAGNOSIS — C8309 Small cell B-cell lymphoma, extranodal and solid organ sites: Secondary | ICD-10-CM | POA: Diagnosis not present

## 2022-04-27 DIAGNOSIS — C8591 Non-Hodgkin lymphoma, unspecified, lymph nodes of head, face, and neck: Secondary | ICD-10-CM | POA: Diagnosis not present

## 2022-04-27 DIAGNOSIS — C8511 Unspecified B-cell lymphoma, lymph nodes of head, face, and neck: Secondary | ICD-10-CM | POA: Diagnosis not present

## 2022-06-07 DIAGNOSIS — M10071 Idiopathic gout, right ankle and foot: Secondary | ICD-10-CM | POA: Diagnosis not present

## 2022-06-07 DIAGNOSIS — Z79899 Other long term (current) drug therapy: Secondary | ICD-10-CM | POA: Diagnosis not present

## 2022-06-15 ENCOUNTER — Ambulatory Visit: Payer: Medicare Other

## 2022-06-15 DIAGNOSIS — I6523 Occlusion and stenosis of bilateral carotid arteries: Secondary | ICD-10-CM

## 2022-06-21 NOTE — Progress Notes (Unsigned)
Subjective:   Haley Wilcox, female    DOB: 1931-12-04, 89 y.o.   MRN: [id number]   Chief complaint:  Atrial fibrillation   HPI   89 y.o. African-American female with hypertension, type 2 diabetes mellitus, hyperlipidemia, iron deficiency anemia, recent Lt ACA stroke 05/2018, persistent Afib, mod b/l carotid stenoses.  Patient is doing well. 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 She has regular follow up with [facility] for the same. From cardiac standpoint, she has no complaints.    Current Outpatient Medications:    allopurinol (ZYLOPRIM) 100 MG tablet, Take 100 mg by mouth daily., Disp: , Rfl:    amLODipine (NORVASC) 10 MG tablet, TAKE 1 TABLET DAILY (DOSE INCREASED), Disp: 90 tablet, Rfl: 3   apixaban (ELIQUIS) 5 MG TABS tablet, Take 1 tablet (5 mg total) by mouth 2 (two) times daily., Disp: 60 tablet, Rfl: 2   carvedilol (COREG) 12.5 MG tablet, Take 12.5 mg by mouth 2 (two) times daily., Disp: , Rfl:    doxazosin (CARDURA) 2 MG tablet, Take 2 mg by mouth every evening. , Disp: , Rfl:    enoxaparin (LOVENOX) 80 MG/0.8ML injection, Inject 0.8 mLs (80 mg total) into the skin every 12 (twelve) hours., Disp: 6 mL, Rfl: 1   levothyroxine (SYNTHROID, LEVOTHROID) 25 MCG tablet, Take 25 mcg by mouth daily before breakfast., Disp: , Rfl:    losartan (COZAAR) 100 MG tablet, Take 1 tablet (100 mg total) by mouth daily., Disp: 60 tablet, Rfl: 3   metFORMIN (GLUCOPHAGE) 500 MG tablet, Take 500 mg by mouth 2 (two) times daily with a meal., Disp: , Rfl:    Multiple Vitamin (MULTIVITAMIN ADULT PO), Take 1 Scoop by mouth daily., Disp: , Rfl:    omeprazole (PRILOSEC) 20 MG capsule, TAKE 1 CAPSULE DAILY AS NEEDED (HEART BURN) (Patient taking differently: Take 20 mg by mouth daily as needed (heart burn).), Disp: 30 capsule, Rfl: 0   polyethylene glycol powder (GLYCOLAX/MIRALAX) powder,  Mix 17gm in 4oz of water.  Start with 2 doses per day, but may take up to 6 doses per day.  Titrate number of doses to allow 2-3 soft bowel movements daily. (Patient taking differently: Take 17 g by mouth daily.), Disp: 850 g, Rfl: 1   rosuvastatin (CRESTOR) 10 MG tablet, TAKE 1 TABLET DAILY, Disp: 90 tablet, Rfl: 3   spironolactone (ALDACTONE) 25 MG tablet, TAKE 1 TABLET DAILY, Disp: 90 tablet, Rfl: 3  Cardiovascular studies:  EKG 12/20/2020: Atrial fibrillation 62 bpm  Low voltage in precordial leads  Carotid artery duplex 06/15/2022: Duplex suggests stenosis in the right internal carotid artery (50-69%). Duplex suggests stenosis in the left internal carotid artery (16-49%). Antegrade right vertebral artery flow. Antegrade left vertebral artery flow. Compared to the study done on 06/12/2021, mild regression of disease on the left from >50%. Follow up in six months is appropriate if clinically indicated.    Echocardiogram 06/08/2018:  1. The left ventricle has normal systolic function with an ejection fraction of 60-65%. The cavity size was normal. There is mildly increased left ventricular wall thickness. Left ventricular diastolic function could not be evaluated secondary to atrial  fibrillation.  2. The right ventricle has normal systolic function. The cavity was normal. There is no increase in right ventricular wall thickness.  3. Left atrial size was severely dilated.  4. Right atrial size was mildly dilated.  5. The aortic  root and ascending aorta are normal in size and structure.  6. The interatrial septum was not assessed.   Recent labs: 11/20/2020: Glucose 134, BUN/Cr 27/1.0. EGFR 55. Na/K 139/4.7. Rest of the CMP normal H/H 11.7/35.9. MCV 85.9. Platelets 231 Chol 125, TG 91, HDL 47, LDL 61  06/05/2020: Glucose 99, BUN/Cr 27/0.91. EGFR >60. Na/K 140/4.8. Rest of the CMP normal H/H 10.9/33.5. MCV 85. Platelets 242 HbA1C 7.1%  11/14/2019: Glucose 136, BUN/Cr 20/0.93. EGFR 56.  Na/K 142/4.7. Calcium 10.5. Rest of the CMP normal H/H 12.8/40.7. MCV 86. Platelets 213 HbA1C 7.3% Chol 129, TG 100, HDL 51, LDL 59 TSH 3.0 normal  08/07/2019: Glucose 169, BUN/Cr 20/0.9. eGFR 54. HbA1C 7.5% Chol 123, TG 84, HDL 49, LDL 155 TSH 3.3 normal   Review of Systems  HENT:         Facial tumor  Cardiovascular:  Negative for chest pain, dyspnea on exertion, leg swelling, palpitations and syncope.        There is no height or weight on file to calculate BMI.   There were no vitals filed for this visit.    Objective:    Physical Exam Vitals and nursing note reviewed.  Constitutional:      General: She is not in acute distress. HENT:     Head:     Comments: Decrease in left maxilla swelling compared to 11/2020 Neck:     Vascular: No JVD.  Cardiovascular:     Rate and Rhythm: Normal rate. Rhythm irregular.     Pulses: Intact distal pulses.     Heart sounds: No murmur heard. Pulmonary:     Effort: Pulmonary effort is normal.     Breath sounds: Normal breath sounds. No wheezing or rales.  Musculoskeletal:     Right lower leg: No edema.     Left lower leg: No edema.    No diagnosis found.   No orders of the defined types were placed in this encounter.      Assessment & Recommendations:   89 y.o. 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 Continue coreg 12.5 mg bid.  CHA2DS2VASc score 7, annual stroke risk 11% Continue eliquis 5 mg bid  Hyperlipidemia: Lipids well controlled on rosuvastatin 10 mg  Hypertension Well controlled.   H/o stroke Stroke in [place] ACA. Minimal neurodeficit.  Continue Crestor to 10 mg. LDL 59. Not on Aspirin due to  ongoing use of eliquis given Afib. Continue f/u w/Neurology.  Carotid stenosis, asymptomatic, bilateral: Moderate. Stable. Will check back in 1 year  Type 2 DM: Follow up with PCP.  F/u in 1 year  Nigel Mormon, MD [facility] Pager: [phone] Office: [phone] If no answer Cell [phone]

## 2022-06-22 ENCOUNTER — Ambulatory Visit: Payer: Medicare Other | Admitting: Cardiology

## 2022-06-22 ENCOUNTER — Encounter: Payer: Self-pay | Admitting: Cardiology

## 2022-06-22 VITALS — BP 131/58 | HR 71 | Resp 16 | Ht 63.0 in | Wt 185.0 lb

## 2022-06-22 DIAGNOSIS — I4819 Other persistent atrial fibrillation: Secondary | ICD-10-CM

## 2022-06-22 DIAGNOSIS — E782 Mixed hyperlipidemia: Secondary | ICD-10-CM

## 2022-06-22 DIAGNOSIS — I10 Essential (primary) hypertension: Secondary | ICD-10-CM

## 2022-06-22 DIAGNOSIS — E119 Type 2 diabetes mellitus without complications: Secondary | ICD-10-CM

## 2022-06-22 DIAGNOSIS — I6523 Occlusion and stenosis of bilateral carotid arteries: Secondary | ICD-10-CM | POA: Diagnosis not present

## 2022-06-23 ENCOUNTER — Other Ambulatory Visit: Payer: Self-pay | Admitting: Cardiology

## 2022-06-23 DIAGNOSIS — E782 Mixed hyperlipidemia: Secondary | ICD-10-CM

## 2022-07-08 DIAGNOSIS — E7800 Pure hypercholesterolemia, unspecified: Secondary | ICD-10-CM | POA: Diagnosis not present

## 2022-07-08 DIAGNOSIS — I700 Atherosclerosis of aorta: Secondary | ICD-10-CM | POA: Diagnosis not present

## 2022-07-08 DIAGNOSIS — C8510 Unspecified B-cell lymphoma, unspecified site: Secondary | ICD-10-CM | POA: Diagnosis not present

## 2022-07-08 DIAGNOSIS — D6869 Other thrombophilia: Secondary | ICD-10-CM | POA: Diagnosis not present

## 2022-07-08 DIAGNOSIS — E1169 Type 2 diabetes mellitus with other specified complication: Secondary | ICD-10-CM | POA: Diagnosis not present

## 2022-07-08 DIAGNOSIS — M109 Gout, unspecified: Secondary | ICD-10-CM | POA: Diagnosis not present

## 2022-07-08 DIAGNOSIS — I2510 Atherosclerotic heart disease of native coronary artery without angina pectoris: Secondary | ICD-10-CM | POA: Diagnosis not present

## 2022-07-08 DIAGNOSIS — E039 Hypothyroidism, unspecified: Secondary | ICD-10-CM | POA: Diagnosis not present

## 2022-07-08 DIAGNOSIS — I10 Essential (primary) hypertension: Secondary | ICD-10-CM | POA: Diagnosis not present

## 2022-07-08 DIAGNOSIS — I480 Paroxysmal atrial fibrillation: Secondary | ICD-10-CM | POA: Diagnosis not present

## 2022-07-20 ENCOUNTER — Ambulatory Visit: Payer: Medicare Other

## 2022-07-20 DIAGNOSIS — I4819 Other persistent atrial fibrillation: Secondary | ICD-10-CM | POA: Diagnosis not present

## 2022-07-28 DIAGNOSIS — D479 Neoplasm of uncertain behavior of lymphoid, hematopoietic and related tissue, unspecified: Secondary | ICD-10-CM | POA: Diagnosis not present

## 2022-07-28 DIAGNOSIS — C760 Malignant neoplasm of head, face and neck: Secondary | ICD-10-CM | POA: Diagnosis not present

## 2022-10-28 IMAGING — CR DG WRIST COMPLETE 3+V*R*
4 series · 4 of 4 positions shown · non-contrast
Comparison: None.

CLINICAL DATA: Right wrist pain and scaphoid area. Loss of
strength.

EXAM:
RIGHT WRIST - COMPLETE 3+ VIEW

[x wrist pa right]
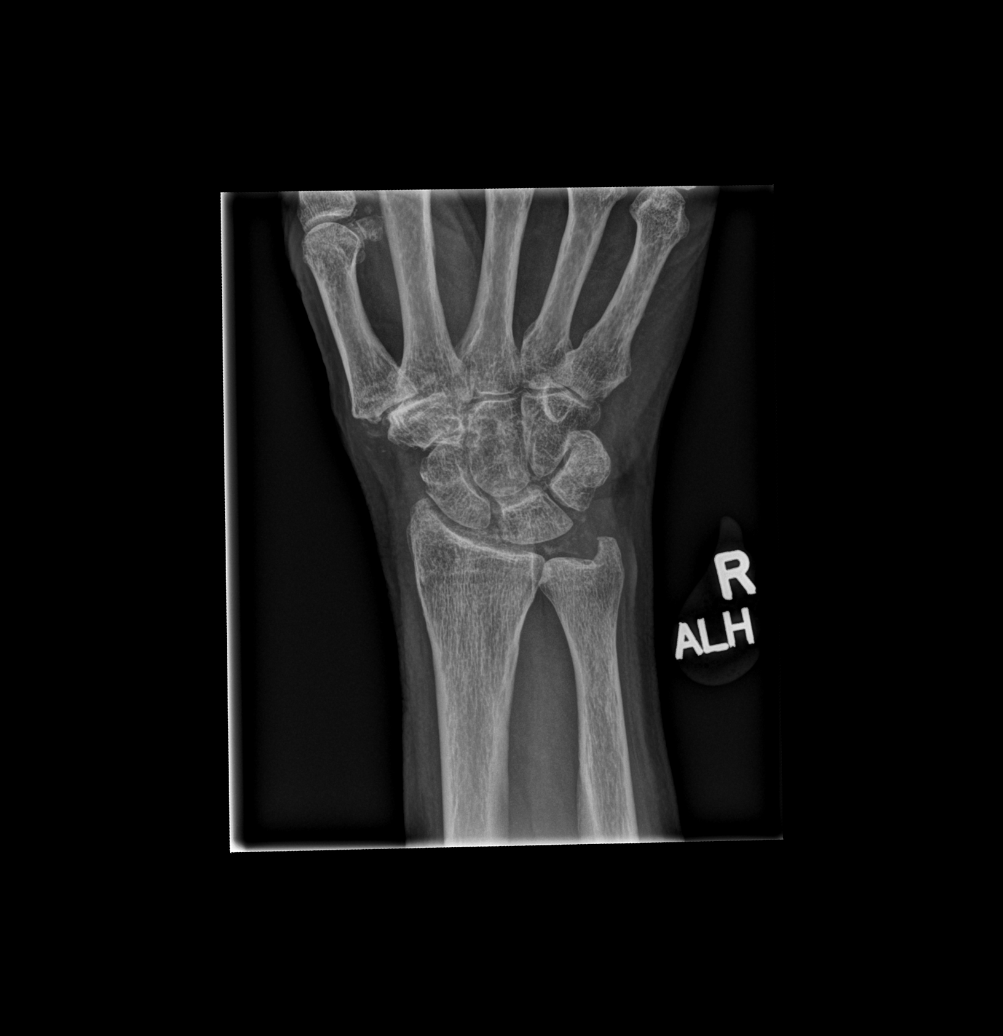

[x wrist navicular view right]
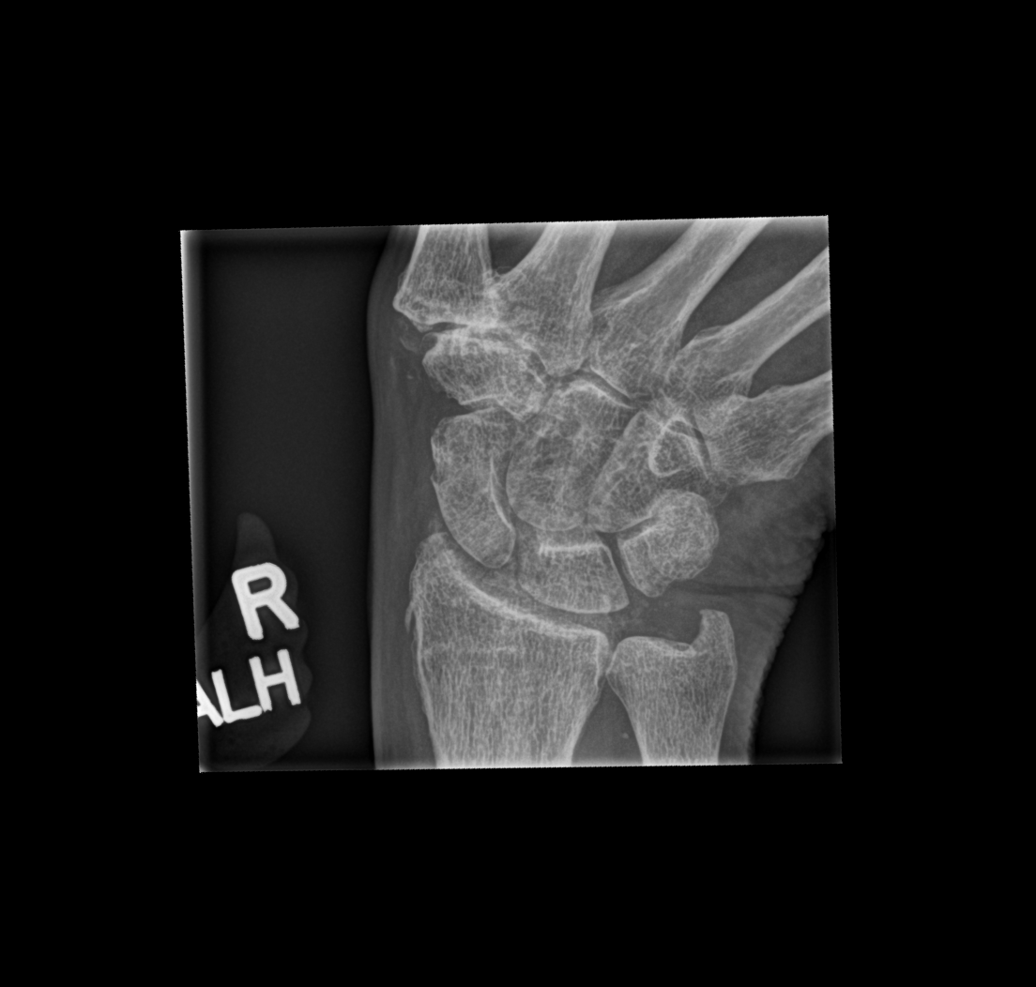

[x wrist obl right]
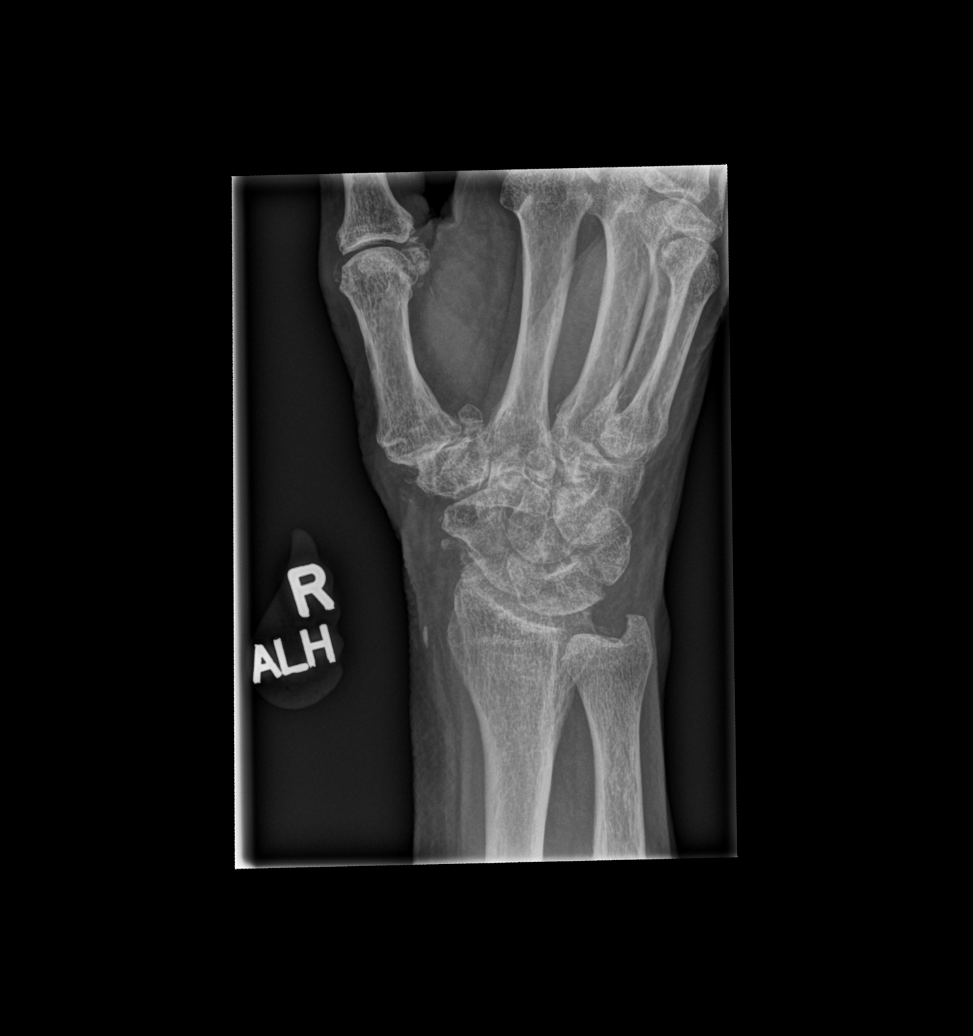

[x wrist lat right]
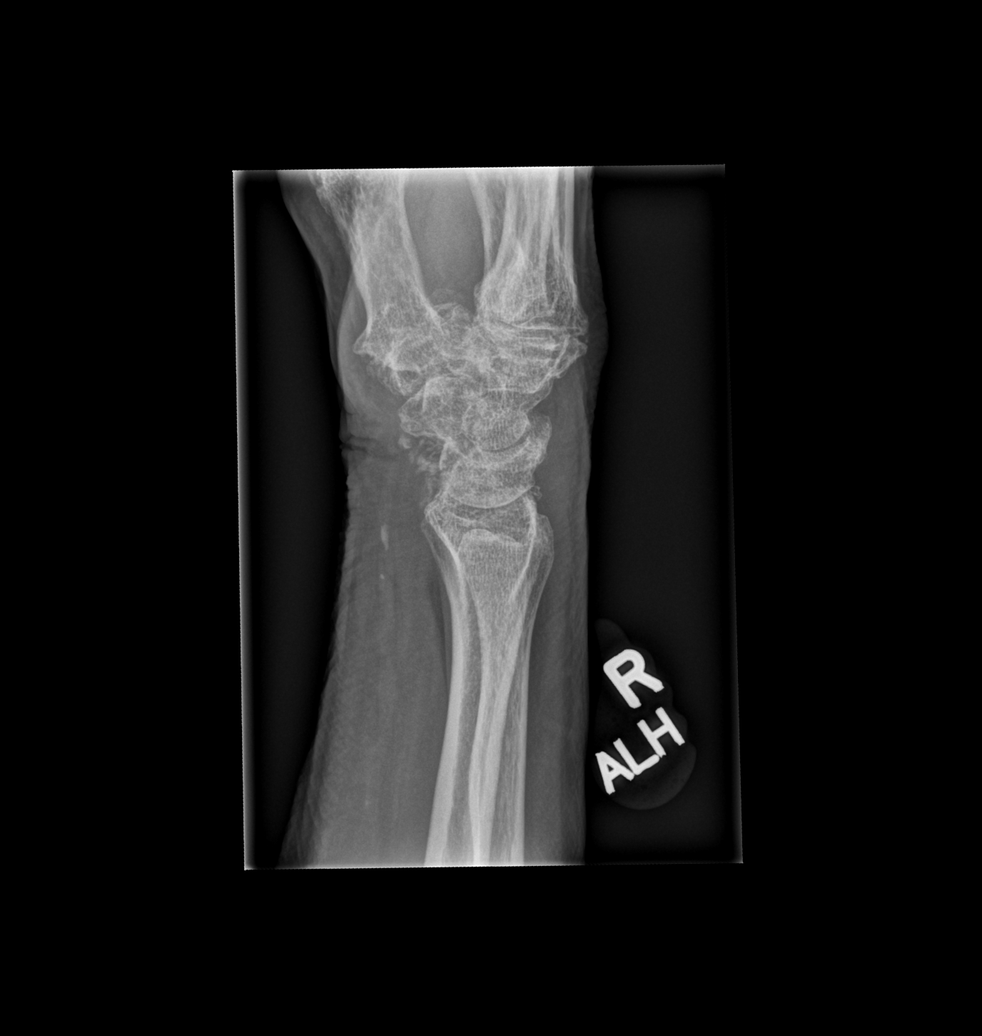

[4 of 4 positions shown; findings below may reference images not displayed]

FINDINGS: Mild-to-moderate distal lateral radial-scaphoid joint space
narrowing. There is chondrocalcinosis within the radiocarpal joint
and triangular fibrocartilage complex as well as the thumb
carpometacarpal joint. There are degenerative ossicles in between
the bases of the first and second metacarpals.

Moderate to severe thumb carpometacarpal and moderate triscaphe
joint space narrowing, subchondral sclerosis, and peripheral
osteophytosis degenerative change.

No acute fracture is seen. No dislocation. Vascular calcifications
are noted.
IMPRESSION: Osteoarthritis including severe thumb carpometacarpal and moderate
triscaphe joint osteoarthritis.

## 2022-11-10 DIAGNOSIS — Z1331 Encounter for screening for depression: Secondary | ICD-10-CM | POA: Diagnosis not present

## 2022-11-10 DIAGNOSIS — M109 Gout, unspecified: Secondary | ICD-10-CM | POA: Diagnosis not present

## 2022-11-10 DIAGNOSIS — I6523 Occlusion and stenosis of bilateral carotid arteries: Secondary | ICD-10-CM | POA: Diagnosis not present

## 2022-11-10 DIAGNOSIS — D6869 Other thrombophilia: Secondary | ICD-10-CM | POA: Diagnosis not present

## 2022-11-10 DIAGNOSIS — I480 Paroxysmal atrial fibrillation: Secondary | ICD-10-CM | POA: Diagnosis not present

## 2022-11-10 DIAGNOSIS — E039 Hypothyroidism, unspecified: Secondary | ICD-10-CM | POA: Diagnosis not present

## 2022-11-10 DIAGNOSIS — E7800 Pure hypercholesterolemia, unspecified: Secondary | ICD-10-CM | POA: Diagnosis not present

## 2022-11-10 DIAGNOSIS — Z23 Encounter for immunization: Secondary | ICD-10-CM | POA: Diagnosis not present

## 2022-11-10 DIAGNOSIS — I700 Atherosclerosis of aorta: Secondary | ICD-10-CM | POA: Diagnosis not present

## 2022-11-10 DIAGNOSIS — C8510 Unspecified B-cell lymphoma, unspecified site: Secondary | ICD-10-CM | POA: Diagnosis not present

## 2022-11-10 DIAGNOSIS — Z0000 Encounter for general adult medical examination without abnormal findings: Secondary | ICD-10-CM | POA: Diagnosis not present

## 2022-11-10 DIAGNOSIS — E1169 Type 2 diabetes mellitus with other specified complication: Secondary | ICD-10-CM | POA: Diagnosis not present

## 2022-11-10 DIAGNOSIS — I2510 Atherosclerotic heart disease of native coronary artery without angina pectoris: Secondary | ICD-10-CM | POA: Diagnosis not present

## 2022-11-10 DIAGNOSIS — I10 Essential (primary) hypertension: Secondary | ICD-10-CM | POA: Diagnosis not present

## 2023-01-12 ENCOUNTER — Other Ambulatory Visit: Payer: Self-pay | Admitting: Cardiology

## 2023-01-12 DIAGNOSIS — I1A0 Resistant hypertension: Secondary | ICD-10-CM

## 2023-02-01 DIAGNOSIS — C8599 Non-Hodgkin lymphoma, unspecified, extranodal and solid organ sites: Secondary | ICD-10-CM | POA: Diagnosis not present

## 2023-02-01 DIAGNOSIS — R930 Abnormal findings on diagnostic imaging of skull and head, not elsewhere classified: Secondary | ICD-10-CM | POA: Diagnosis not present

## 2023-02-01 DIAGNOSIS — C8309 Small cell B-cell lymphoma, extranodal and solid organ sites: Secondary | ICD-10-CM | POA: Diagnosis not present

## 2023-02-10 DIAGNOSIS — C8599 Non-Hodgkin lymphoma, unspecified, extranodal and solid organ sites: Secondary | ICD-10-CM | POA: Diagnosis not present

## 2023-02-10 DIAGNOSIS — C760 Malignant neoplasm of head, face and neck: Secondary | ICD-10-CM | POA: Diagnosis not present

## 2023-02-11 DIAGNOSIS — C8599 Non-Hodgkin lymphoma, unspecified, extranodal and solid organ sites: Secondary | ICD-10-CM | POA: Diagnosis not present

## 2023-02-16 DIAGNOSIS — C8309 Small cell B-cell lymphoma, extranodal and solid organ sites: Secondary | ICD-10-CM | POA: Diagnosis not present

## 2023-02-16 DIAGNOSIS — C8599 Non-Hodgkin lymphoma, unspecified, extranodal and solid organ sites: Secondary | ICD-10-CM | POA: Diagnosis not present

## 2023-02-16 DIAGNOSIS — C8510 Unspecified B-cell lymphoma, unspecified site: Secondary | ICD-10-CM | POA: Diagnosis not present

## 2023-02-17 DIAGNOSIS — C8599 Non-Hodgkin lymphoma, unspecified, extranodal and solid organ sites: Secondary | ICD-10-CM | POA: Diagnosis not present

## 2023-05-12 DIAGNOSIS — D6869 Other thrombophilia: Secondary | ICD-10-CM | POA: Diagnosis not present

## 2023-05-12 DIAGNOSIS — C8510 Unspecified B-cell lymphoma, unspecified site: Secondary | ICD-10-CM | POA: Diagnosis not present

## 2023-05-12 DIAGNOSIS — I2510 Atherosclerotic heart disease of native coronary artery without angina pectoris: Secondary | ICD-10-CM | POA: Diagnosis not present

## 2023-05-12 DIAGNOSIS — I480 Paroxysmal atrial fibrillation: Secondary | ICD-10-CM | POA: Diagnosis not present

## 2023-05-12 DIAGNOSIS — I6523 Occlusion and stenosis of bilateral carotid arteries: Secondary | ICD-10-CM | POA: Diagnosis not present

## 2023-05-12 DIAGNOSIS — M109 Gout, unspecified: Secondary | ICD-10-CM | POA: Diagnosis not present

## 2023-05-12 DIAGNOSIS — I700 Atherosclerosis of aorta: Secondary | ICD-10-CM | POA: Diagnosis not present

## 2023-05-12 DIAGNOSIS — E1169 Type 2 diabetes mellitus with other specified complication: Secondary | ICD-10-CM | POA: Diagnosis not present

## 2023-05-12 DIAGNOSIS — E039 Hypothyroidism, unspecified: Secondary | ICD-10-CM | POA: Diagnosis not present

## 2023-05-12 DIAGNOSIS — E7800 Pure hypercholesterolemia, unspecified: Secondary | ICD-10-CM | POA: Diagnosis not present

## 2023-05-12 DIAGNOSIS — R5383 Other fatigue: Secondary | ICD-10-CM | POA: Diagnosis not present

## 2023-05-12 DIAGNOSIS — I10 Essential (primary) hypertension: Secondary | ICD-10-CM | POA: Diagnosis not present

## 2023-05-24 DIAGNOSIS — J331 Polypoid sinus degeneration: Secondary | ICD-10-CM | POA: Diagnosis not present

## 2023-05-24 DIAGNOSIS — C8309 Small cell B-cell lymphoma, extranodal and solid organ sites: Secondary | ICD-10-CM | POA: Diagnosis not present

## 2023-05-24 DIAGNOSIS — C8510 Unspecified B-cell lymphoma, unspecified site: Secondary | ICD-10-CM | POA: Diagnosis not present

## 2023-06-01 DIAGNOSIS — C8510 Unspecified B-cell lymphoma, unspecified site: Secondary | ICD-10-CM | POA: Diagnosis not present

## 2023-06-01 DIAGNOSIS — C8581 Other specified types of non-Hodgkin lymphoma, lymph nodes of head, face, and neck: Secondary | ICD-10-CM | POA: Diagnosis not present

## 2023-06-14 ENCOUNTER — Other Ambulatory Visit: Payer: Medicare Other

## 2023-06-15 DIAGNOSIS — D479 Neoplasm of uncertain behavior of lymphoid, hematopoietic and related tissue, unspecified: Secondary | ICD-10-CM | POA: Diagnosis not present

## 2023-06-15 DIAGNOSIS — C8510 Unspecified B-cell lymphoma, unspecified site: Secondary | ICD-10-CM | POA: Diagnosis not present

## 2023-06-16 ENCOUNTER — Ambulatory Visit (HOSPITAL_COMMUNITY)
Admission: RE | Admit: 2023-06-16 | Discharge: 2023-06-16 | Disposition: A | Discharge status: Home / Self Care | Payer: Medicare Other | Source: Ambulatory Visit | Attending: Cardiology | Admitting: Cardiology

## 2023-06-16 DIAGNOSIS — I6523 Occlusion and stenosis of bilateral carotid arteries: Secondary | ICD-10-CM | POA: Diagnosis not present

## 2023-06-17 NOTE — Progress Notes (Signed)
 Mild bilateral carotid disease. Continued current medications. I think we are okay not doing routine surveillance carotid studies going forward given that disease is mild now, was previously moderate.  Thanks MJP

## 2023-06-23 ENCOUNTER — Ambulatory Visit: Payer: Medicare Other | Admitting: Cardiology

## 2023-06-24 ENCOUNTER — Other Ambulatory Visit: Payer: Self-pay | Admitting: Cardiology

## 2023-06-24 DIAGNOSIS — E782 Mixed hyperlipidemia: Secondary | ICD-10-CM

## 2023-06-29 DIAGNOSIS — Z79899 Other long term (current) drug therapy: Secondary | ICD-10-CM | POA: Diagnosis not present

## 2023-06-29 DIAGNOSIS — Z5111 Encounter for antineoplastic chemotherapy: Secondary | ICD-10-CM | POA: Diagnosis not present

## 2023-06-29 DIAGNOSIS — C8510 Unspecified B-cell lymphoma, unspecified site: Secondary | ICD-10-CM | POA: Diagnosis not present

## 2023-07-20 DIAGNOSIS — D479 Neoplasm of uncertain behavior of lymphoid, hematopoietic and related tissue, unspecified: Secondary | ICD-10-CM | POA: Diagnosis not present

## 2023-07-20 DIAGNOSIS — Z79899 Other long term (current) drug therapy: Secondary | ICD-10-CM | POA: Diagnosis not present

## 2023-07-20 DIAGNOSIS — Z8679 Personal history of other diseases of the circulatory system: Secondary | ICD-10-CM | POA: Diagnosis not present

## 2023-07-20 DIAGNOSIS — K219 Gastro-esophageal reflux disease without esophagitis: Secondary | ICD-10-CM | POA: Diagnosis not present

## 2023-07-20 DIAGNOSIS — C8309 Small cell B-cell lymphoma, extranodal and solid organ sites: Secondary | ICD-10-CM | POA: Diagnosis not present

## 2023-07-20 DIAGNOSIS — C8510 Unspecified B-cell lymphoma, unspecified site: Secondary | ICD-10-CM | POA: Diagnosis not present

## 2023-07-20 DIAGNOSIS — I639 Cerebral infarction, unspecified: Secondary | ICD-10-CM | POA: Diagnosis not present

## 2023-07-20 DIAGNOSIS — G629 Polyneuropathy, unspecified: Secondary | ICD-10-CM | POA: Diagnosis not present

## 2023-07-20 DIAGNOSIS — E785 Hyperlipidemia, unspecified: Secondary | ICD-10-CM | POA: Diagnosis not present

## 2023-07-20 DIAGNOSIS — E119 Type 2 diabetes mellitus without complications: Secondary | ICD-10-CM | POA: Diagnosis not present

## 2023-07-20 DIAGNOSIS — I10 Essential (primary) hypertension: Secondary | ICD-10-CM | POA: Diagnosis not present

## 2023-07-20 DIAGNOSIS — Z5112 Encounter for antineoplastic immunotherapy: Secondary | ICD-10-CM | POA: Diagnosis not present

## 2023-07-20 DIAGNOSIS — Z8739 Personal history of other diseases of the musculoskeletal system and connective tissue: Secondary | ICD-10-CM | POA: Diagnosis not present

## 2023-08-02 ENCOUNTER — Encounter: Payer: Self-pay | Admitting: Cardiology

## 2023-08-02 ENCOUNTER — Ambulatory Visit: Attending: Cardiology | Admitting: Cardiology

## 2023-08-02 VITALS — BP 130/70 | HR 58 | Resp 16 | Ht 63.0 in | Wt 176.2 lb

## 2023-08-02 DIAGNOSIS — I4819 Other persistent atrial fibrillation: Secondary | ICD-10-CM

## 2023-08-02 DIAGNOSIS — I10 Essential (primary) hypertension: Secondary | ICD-10-CM | POA: Diagnosis present

## 2023-08-02 DIAGNOSIS — E782 Mixed hyperlipidemia: Secondary | ICD-10-CM | POA: Diagnosis present

## 2023-08-02 MED ORDER — SPIRONOLACTONE 25 MG PO TABS: 25.0000 mg | ORAL_TABLET | Freq: Every day | ORAL | 3 refills | Status: AC

## 2023-08-02 MED ORDER — ROSUVASTATIN CALCIUM 10 MG PO TABS: 10.0000 mg | ORAL_TABLET | Freq: Every day | ORAL | 3 refills | Status: AC

## 2023-08-02 NOTE — Progress Notes (Signed)
 Cardiology Office Note:  .   Date:  08/02/2023  ID:  Santiago Cuff, DOB Jul 13, 1931, MRN [id number] PCP: Merl Star, MD  [facility] Providers Cardiologist:  Fransico Ivy, MD PCP: Merl Star, MD  Chief Complaint  Patient presents with   Atrial Fibrillation   Follow-up     Haley Wilcox is a 89 y.o. 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  She denies any cardiac symptoms, such as chest pain, shortness of breath, orthopnea, PND, palpitations.  Reviewed recent lab results with the patient, details below.     Vitals:   08/02/23 1309  BP: 130/70  Pulse: (!) 58  Resp: 16  SpO2: 97%      Review of Systems  Cardiovascular:  Negative for chest pain, dyspnea on exertion, leg swelling, palpitations and syncope.        Studies Reviewed: Aaron Aas   EKG Interpretation Date/Time:  Monday Aug 02 2023 13:12:56 EDT Ventricular Rate:  58 PR Interval:    QRS Duration:  82 QT Interval:  442 QTC Calculation: 433 R Axis:   72  Text Interpretation: EKG 08/02/2023: Atrial flutter with 4:1 A-V conduction Low voltage QRS Nonspecific ST and T wave abnormality When compared with ECG of 07-Jun-2018 16:02, atypical flutter waves are more prominent  Confirmed by Fransico Ivy [phone]) on 08/02/2023 1:19:30 PM    EKG 08/02/2023: Atrial flutter with 4:1 A-V conduction Low voltage QRS Nonspecific ST and T wave abnormality When compared with ECG of 07-Jun-2018 16:02,  atypical flutter waves are more prominent    Echocardiogram 06/2022: Low normal LV systolic function with visual EF 50%. Left ventricle cavity is normal in size. Mild  concentric hypertrophy of the left ventricle. Normal global wall motion. Indeterminate diastolic filling pattern, elevated LAP. Calculated EF 50%. Left atrial cavity is severely dilated at 72.3 ml/m^2. Right atrial cavity is severely dilated. Trileaflet aortic valve with no regurgitation. Mild aortic valve leaflet calcification. Structurally normal mitral valve.  Mild (Grade I) mitral regurgitation. Structurally normal tricuspid valve.  Moderate tricuspid regurgitation. Mild pulmonary hypertension. RVSP measures 38 mmHg. No prior available for comparison.  Carotid duplex 05/2023: Right Carotid: Velocities in the right ICA are consistent with an  upper-range 1-39% stenosis.  Left Carotid: Velocities in the left ICA are consistent with a 1-39%  stenosis.  Vertebrals: Bilateral vertebral arteries demonstrate antegrade flow.  Subclavians: Normal flow hemodynamics were seen in bilateral subclavian         arteries.     Independently interpreted 04/2023: Chol 130, TG 107, HDL 49, LDL 60 HbA1C 7.0% Hb 12.2 Cr 1.0   Risk Assessment/Calculations:    CHA2DS2-VASc Score = 7   This indicates a 11.2% annual risk of stroke. The patient's score is based upon: CHF History: 0 HTN History: 1 Diabetes History: 1 Stroke History: 2 Vascular Disease History: 0 Age Score: 2 Gender Score: 1      Physical Exam Vitals and nursing note reviewed.  Constitutional:      General: She is not in acute distress. Neck:     Vascular: No JVD.  Cardiovascular:     Rate and Rhythm: Normal rate and regular rhythm.     Heart sounds: Normal heart sounds. No  murmur heard. Pulmonary:     Effort: Pulmonary effort is normal.     Breath sounds: Normal breath sounds. No wheezing or rales.  Musculoskeletal:     Right lower leg: No edema.     Left lower leg: No edema.      VISIT DIAGNOSES:   ICD-10-CM   1. Persistent atrial fibrillation (HCC)  I48.19 EKG 12-Lead    2. Resistant hypertension  I1A.0  spironolactone  (ALDACTONE ) 25 MG tablet    3. Mixed hyperlipidemia  E78.2 rosuvastatin  (CRESTOR ) 10 MG tablet       Wanita SHERRAE LEHRER is a 89 y.o. 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  Clinically asymptomatic. Reasonable to continue rate control strategy. Continue coreg  12.5 mg bid.  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 Refilled the same.   Hypertension Well controlled.  Refilled spironolactone , as requested.   H/o stroke Stroke in [place] ACA. Minimal neurodeficit.  Continue Crestor  to 10 mg.  Not on Aspirin  due to ongoing use of eliquis  given Afib.   Carotid stenosis, asymptomatic, bilateral: Only mild on most recent carotid duplex.  Will discontinue routine surveillance.   Type 2 DM: Follow up with PCP.      Meds ordered this encounter  Medications   spironolactone  (ALDACTONE ) 25 MG tablet    Sig: Take 1 tablet (25 mg total) by mouth daily.    Dispense:  90 tablet    Refill:  3   rosuvastatin  (CRESTOR ) 10 MG tablet    Sig: Take 1 tablet (10 mg total) by mouth daily. Please keep scheduled appointment for future refills. Thank you.    Dispense:  90 tablet    Refill:  3     F/u in 1 year  Signed, Cody Das, MD

## 2023-08-02 NOTE — Patient Instructions (Signed)
 Medication Instructions:  Refills sent in today  *If you need a refill on your cardiac medications before your next appointment, please call your pharmacy*  Follow-Up: At [facility], you and your health needs are our priority.  As part of our continuing mission to provide you with exceptional heart care, our providers are all part of one team.  This team includes your primary Cardiologist (physician) and Advanced Practice Providers or APPs (Physician Assistants and Nurse Practitioners) who all work together to provide you with the care you need, when you need it.  Your next appointment:   1 year(s)  Provider:   Cody Das, MD

## 2023-08-10 DIAGNOSIS — Z7901 Long term (current) use of anticoagulants: Secondary | ICD-10-CM | POA: Diagnosis not present

## 2023-08-10 DIAGNOSIS — K219 Gastro-esophageal reflux disease without esophagitis: Secondary | ICD-10-CM | POA: Diagnosis not present

## 2023-08-10 DIAGNOSIS — C8309 Small cell B-cell lymphoma, extranodal and solid organ sites: Secondary | ICD-10-CM | POA: Diagnosis not present

## 2023-08-10 DIAGNOSIS — G620 Drug-induced polyneuropathy: Secondary | ICD-10-CM | POA: Diagnosis not present

## 2023-08-10 DIAGNOSIS — T50905A Adverse effect of unspecified drugs, medicaments and biological substances, initial encounter: Secondary | ICD-10-CM | POA: Diagnosis not present

## 2023-08-10 DIAGNOSIS — I10 Essential (primary) hypertension: Secondary | ICD-10-CM | POA: Diagnosis not present

## 2023-08-10 DIAGNOSIS — Z5112 Encounter for antineoplastic immunotherapy: Secondary | ICD-10-CM | POA: Diagnosis not present

## 2023-08-10 DIAGNOSIS — C8510 Unspecified B-cell lymphoma, unspecified site: Secondary | ICD-10-CM | POA: Diagnosis not present

## 2023-08-10 DIAGNOSIS — E119 Type 2 diabetes mellitus without complications: Secondary | ICD-10-CM | POA: Diagnosis not present

## 2023-08-10 DIAGNOSIS — I4891 Unspecified atrial fibrillation: Secondary | ICD-10-CM | POA: Diagnosis not present

## 2023-08-10 DIAGNOSIS — E785 Hyperlipidemia, unspecified: Secondary | ICD-10-CM | POA: Diagnosis not present

## 2023-08-10 DIAGNOSIS — Z8673 Personal history of transient ischemic attack (TIA), and cerebral infarction without residual deficits: Secondary | ICD-10-CM | POA: Diagnosis not present

## 2023-08-31 DIAGNOSIS — Z79899 Other long term (current) drug therapy: Secondary | ICD-10-CM | POA: Diagnosis not present

## 2023-08-31 DIAGNOSIS — E1142 Type 2 diabetes mellitus with diabetic polyneuropathy: Secondary | ICD-10-CM | POA: Diagnosis not present

## 2023-08-31 DIAGNOSIS — R220 Localized swelling, mass and lump, head: Secondary | ICD-10-CM | POA: Diagnosis not present

## 2023-08-31 DIAGNOSIS — Z7984 Long term (current) use of oral hypoglycemic drugs: Secondary | ICD-10-CM | POA: Diagnosis not present

## 2023-08-31 DIAGNOSIS — E039 Hypothyroidism, unspecified: Secondary | ICD-10-CM | POA: Diagnosis not present

## 2023-08-31 DIAGNOSIS — Z8673 Personal history of transient ischemic attack (TIA), and cerebral infarction without residual deficits: Secondary | ICD-10-CM | POA: Diagnosis not present

## 2023-08-31 DIAGNOSIS — Z5111 Encounter for antineoplastic chemotherapy: Secondary | ICD-10-CM | POA: Diagnosis not present

## 2023-08-31 DIAGNOSIS — Z7901 Long term (current) use of anticoagulants: Secondary | ICD-10-CM | POA: Diagnosis not present

## 2023-08-31 DIAGNOSIS — I6523 Occlusion and stenosis of bilateral carotid arteries: Secondary | ICD-10-CM | POA: Diagnosis not present

## 2023-08-31 DIAGNOSIS — I10 Essential (primary) hypertension: Secondary | ICD-10-CM | POA: Diagnosis not present

## 2023-08-31 DIAGNOSIS — R197 Diarrhea, unspecified: Secondary | ICD-10-CM | POA: Diagnosis not present

## 2023-08-31 DIAGNOSIS — M109 Gout, unspecified: Secondary | ICD-10-CM | POA: Diagnosis not present

## 2023-08-31 DIAGNOSIS — R109 Unspecified abdominal pain: Secondary | ICD-10-CM | POA: Diagnosis not present

## 2023-08-31 DIAGNOSIS — K5900 Constipation, unspecified: Secondary | ICD-10-CM | POA: Diagnosis not present

## 2023-08-31 DIAGNOSIS — R12 Heartburn: Secondary | ICD-10-CM | POA: Diagnosis not present

## 2023-08-31 DIAGNOSIS — K219 Gastro-esophageal reflux disease without esophagitis: Secondary | ICD-10-CM | POA: Diagnosis not present

## 2023-08-31 DIAGNOSIS — I4891 Unspecified atrial fibrillation: Secondary | ICD-10-CM | POA: Diagnosis not present

## 2023-08-31 DIAGNOSIS — C8510 Unspecified B-cell lymphoma, unspecified site: Secondary | ICD-10-CM | POA: Diagnosis not present

## 2023-08-31 DIAGNOSIS — E785 Hyperlipidemia, unspecified: Secondary | ICD-10-CM | POA: Diagnosis not present

## 2023-09-21 DIAGNOSIS — C8511 Unspecified B-cell lymphoma, lymph nodes of head, face, and neck: Secondary | ICD-10-CM | POA: Diagnosis not present

## 2023-09-21 DIAGNOSIS — Z8673 Personal history of transient ischemic attack (TIA), and cerebral infarction without residual deficits: Secondary | ICD-10-CM | POA: Diagnosis not present

## 2023-09-21 DIAGNOSIS — E1142 Type 2 diabetes mellitus with diabetic polyneuropathy: Secondary | ICD-10-CM | POA: Diagnosis not present

## 2023-09-21 DIAGNOSIS — M109 Gout, unspecified: Secondary | ICD-10-CM | POA: Diagnosis not present

## 2023-09-21 DIAGNOSIS — C8309 Small cell B-cell lymphoma, extranodal and solid organ sites: Secondary | ICD-10-CM | POA: Diagnosis not present

## 2023-09-21 DIAGNOSIS — C8510 Unspecified B-cell lymphoma, unspecified site: Secondary | ICD-10-CM | POA: Diagnosis not present

## 2023-09-21 DIAGNOSIS — I4891 Unspecified atrial fibrillation: Secondary | ICD-10-CM | POA: Diagnosis not present

## 2023-09-21 DIAGNOSIS — E785 Hyperlipidemia, unspecified: Secondary | ICD-10-CM | POA: Diagnosis not present

## 2023-09-21 DIAGNOSIS — K219 Gastro-esophageal reflux disease without esophagitis: Secondary | ICD-10-CM | POA: Diagnosis not present

## 2023-09-21 DIAGNOSIS — Z79620 Long term (current) use of immunosuppressive biologic: Secondary | ICD-10-CM | POA: Diagnosis not present

## 2023-09-21 DIAGNOSIS — Z7901 Long term (current) use of anticoagulants: Secondary | ICD-10-CM | POA: Diagnosis not present

## 2023-09-21 DIAGNOSIS — Z5111 Encounter for antineoplastic chemotherapy: Secondary | ICD-10-CM | POA: Diagnosis not present

## 2023-09-21 DIAGNOSIS — D630 Anemia in neoplastic disease: Secondary | ICD-10-CM | POA: Diagnosis not present

## 2023-09-21 DIAGNOSIS — Z5112 Encounter for antineoplastic immunotherapy: Secondary | ICD-10-CM | POA: Diagnosis not present

## 2023-09-21 DIAGNOSIS — I6523 Occlusion and stenosis of bilateral carotid arteries: Secondary | ICD-10-CM | POA: Diagnosis not present

## 2023-09-21 DIAGNOSIS — I10 Essential (primary) hypertension: Secondary | ICD-10-CM | POA: Diagnosis not present

## 2023-10-12 DIAGNOSIS — C8309 Small cell B-cell lymphoma, extranodal and solid organ sites: Secondary | ICD-10-CM | POA: Diagnosis not present

## 2023-10-12 DIAGNOSIS — Z79899 Other long term (current) drug therapy: Secondary | ICD-10-CM | POA: Diagnosis not present

## 2023-10-12 DIAGNOSIS — C8510 Unspecified B-cell lymphoma, unspecified site: Secondary | ICD-10-CM | POA: Diagnosis not present

## 2023-11-12 DIAGNOSIS — Z79899 Other long term (current) drug therapy: Secondary | ICD-10-CM | POA: Diagnosis not present

## 2023-11-12 DIAGNOSIS — C8510 Unspecified B-cell lymphoma, unspecified site: Secondary | ICD-10-CM | POA: Diagnosis not present

## 2023-11-12 DIAGNOSIS — C8309 Small cell B-cell lymphoma, extranodal and solid organ sites: Secondary | ICD-10-CM | POA: Diagnosis not present

## 2024-02-10 DIAGNOSIS — E1129 Type 2 diabetes mellitus with other diabetic kidney complication: Secondary | ICD-10-CM | POA: Diagnosis not present

## 2024-02-10 DIAGNOSIS — Z1331 Encounter for screening for depression: Secondary | ICD-10-CM | POA: Diagnosis not present

## 2024-02-10 DIAGNOSIS — E7800 Pure hypercholesterolemia, unspecified: Secondary | ICD-10-CM | POA: Diagnosis not present

## 2024-02-10 DIAGNOSIS — I480 Paroxysmal atrial fibrillation: Secondary | ICD-10-CM | POA: Diagnosis not present

## 2024-02-10 DIAGNOSIS — I700 Atherosclerosis of aorta: Secondary | ICD-10-CM | POA: Diagnosis not present

## 2024-02-10 DIAGNOSIS — D6869 Other thrombophilia: Secondary | ICD-10-CM | POA: Diagnosis not present

## 2024-02-10 DIAGNOSIS — E039 Hypothyroidism, unspecified: Secondary | ICD-10-CM | POA: Diagnosis not present

## 2024-02-10 DIAGNOSIS — C8510 Unspecified B-cell lymphoma, unspecified site: Secondary | ICD-10-CM | POA: Diagnosis not present

## 2024-02-10 DIAGNOSIS — I2510 Atherosclerotic heart disease of native coronary artery without angina pectoris: Secondary | ICD-10-CM | POA: Diagnosis not present

## 2024-02-10 DIAGNOSIS — Z0000 Encounter for general adult medical examination without abnormal findings: Secondary | ICD-10-CM | POA: Diagnosis not present

## 2024-02-10 DIAGNOSIS — I10 Essential (primary) hypertension: Secondary | ICD-10-CM | POA: Diagnosis not present

## 2024-02-10 DIAGNOSIS — Z23 Encounter for immunization: Secondary | ICD-10-CM | POA: Diagnosis not present

## 2024-02-10 DIAGNOSIS — I6523 Occlusion and stenosis of bilateral carotid arteries: Secondary | ICD-10-CM | POA: Diagnosis not present

## 2024-07-27 ENCOUNTER — Ambulatory Visit: Admitting: Cardiology
